# Patient Record
Sex: Female | Born: 1955 | Race: Black or African American | Hispanic: No | Marital: Single | Smoking: Never smoker
Health system: Southern US, Community
[De-identification: ages and names within clinical notes are randomized; demographics above are authoritative.]

## PROBLEM LIST (undated history)

## (undated) DIAGNOSIS — I10 Essential (primary) hypertension: Secondary | ICD-10-CM

## (undated) DIAGNOSIS — N179 Acute kidney failure, unspecified: Secondary | ICD-10-CM

## (undated) DIAGNOSIS — B182 Chronic viral hepatitis C: Secondary | ICD-10-CM

## (undated) DIAGNOSIS — I639 Cerebral infarction, unspecified: Secondary | ICD-10-CM

## (undated) DIAGNOSIS — F1410 Cocaine abuse, uncomplicated: Secondary | ICD-10-CM

---

## 2007-09-29 ENCOUNTER — Emergency Department: Payer: Self-pay | Admitting: Emergency Medicine

## 2007-09-29 ENCOUNTER — Other Ambulatory Visit: Payer: Self-pay

## 2007-11-11 ENCOUNTER — Inpatient Hospital Stay: Payer: Self-pay | Admitting: *Deleted

## 2007-11-11 ENCOUNTER — Other Ambulatory Visit: Payer: Self-pay

## 2008-05-25 ENCOUNTER — Other Ambulatory Visit: Payer: Self-pay

## 2008-05-25 ENCOUNTER — Observation Stay: Payer: Self-pay | Admitting: Internal Medicine

## 2009-04-25 ENCOUNTER — Inpatient Hospital Stay: Payer: Self-pay | Admitting: Internal Medicine

## 2009-09-06 ENCOUNTER — Emergency Department: Payer: Self-pay | Admitting: Unknown Physician Specialty

## 2010-08-26 DIAGNOSIS — I639 Cerebral infarction, unspecified: Secondary | ICD-10-CM

## 2010-08-26 HISTORY — DX: Cerebral infarction, unspecified: I63.9

## 2011-03-21 ENCOUNTER — Emergency Department: Payer: Self-pay | Admitting: Unknown Physician Specialty

## 2011-05-27 ENCOUNTER — Emergency Department: Payer: Self-pay

## 2011-05-27 ENCOUNTER — Inpatient Hospital Stay (HOSPITAL_COMMUNITY)
Admission: AD | Admit: 2011-05-27 | Discharge: 2011-06-21 | DRG: 004 | Disposition: A | Discharge status: Skilled Nursing Facility | Payer: Medicaid Other | Source: Other Acute Inpatient Hospital | Attending: Neurology | Admitting: Neurology

## 2011-05-27 ENCOUNTER — Inpatient Hospital Stay (HOSPITAL_COMMUNITY): Payer: Medicaid Other

## 2011-05-27 DIAGNOSIS — J189 Pneumonia, unspecified organism: Secondary | ICD-10-CM | POA: Diagnosis not present

## 2011-05-27 DIAGNOSIS — B1920 Unspecified viral hepatitis C without hepatic coma: Secondary | ICD-10-CM | POA: Diagnosis present

## 2011-05-27 DIAGNOSIS — I10 Essential (primary) hypertension: Secondary | ICD-10-CM | POA: Diagnosis present

## 2011-05-27 DIAGNOSIS — I619 Nontraumatic intracerebral hemorrhage, unspecified: Secondary | ICD-10-CM | POA: Diagnosis present

## 2011-05-27 DIAGNOSIS — T43601A Poisoning by unspecified psychostimulants, accidental (unintentional), initial encounter: Secondary | ICD-10-CM | POA: Diagnosis present

## 2011-05-27 DIAGNOSIS — J9600 Acute respiratory failure, unspecified whether with hypoxia or hypercapnia: Secondary | ICD-10-CM | POA: Diagnosis not present

## 2011-05-27 DIAGNOSIS — N179 Acute kidney failure, unspecified: Secondary | ICD-10-CM | POA: Diagnosis not present

## 2011-05-27 DIAGNOSIS — G8190 Hemiplegia, unspecified affecting unspecified side: Secondary | ICD-10-CM | POA: Diagnosis present

## 2011-05-27 DIAGNOSIS — T405X1A Poisoning by cocaine, accidental (unintentional), initial encounter: Principal | ICD-10-CM | POA: Diagnosis present

## 2011-05-27 DIAGNOSIS — I629 Nontraumatic intracranial hemorrhage, unspecified: Secondary | ICD-10-CM

## 2011-05-27 DIAGNOSIS — F1410 Cocaine abuse, uncomplicated: Secondary | ICD-10-CM | POA: Diagnosis present

## 2011-05-27 DIAGNOSIS — E876 Hypokalemia: Secondary | ICD-10-CM | POA: Diagnosis not present

## 2011-05-27 DIAGNOSIS — R4020 Unspecified coma: Secondary | ICD-10-CM | POA: Diagnosis present

## 2011-05-27 DIAGNOSIS — Z794 Long term (current) use of insulin: Secondary | ICD-10-CM

## 2011-05-28 ENCOUNTER — Inpatient Hospital Stay (HOSPITAL_COMMUNITY): Payer: Medicaid Other

## 2011-05-28 DIAGNOSIS — F1410 Cocaine abuse, uncomplicated: Secondary | ICD-10-CM

## 2011-05-28 DIAGNOSIS — I629 Nontraumatic intracranial hemorrhage, unspecified: Secondary | ICD-10-CM

## 2011-05-28 DIAGNOSIS — I10 Essential (primary) hypertension: Secondary | ICD-10-CM

## 2011-05-28 DIAGNOSIS — J9600 Acute respiratory failure, unspecified whether with hypoxia or hypercapnia: Secondary | ICD-10-CM

## 2011-05-28 LAB — BLOOD GAS, ARTERIAL
Acid-base deficit: 1 mmol/L (ref 0.0–2.0)
Bicarbonate: 22.9 mEq/L (ref 20.0–24.0)
FIO2: 0.5 %
O2 Saturation: 99.8 %
PEEP: 5 cmH2O
Patient temperature: 98.6
RATE: 18 resp/min

## 2011-05-28 LAB — BASIC METABOLIC PANEL
BUN: 23 mg/dL (ref 6–23)
BUN: 15 mg/dL (ref 6–23)
CO2: 27 mEq/L (ref 19–32)
Calcium: 9.4 mg/dL (ref 8.4–10.5)
Chloride: 103 mEq/L (ref 96–112)
Creatinine, Ser: 1.26 mg/dL — ABNORMAL HIGH (ref 0.50–1.10)
Creatinine, Ser: 1.16 mg/dL — ABNORMAL HIGH (ref 0.50–1.10)
GFR calc Af Amer: 60 mL/min — ABNORMAL LOW (ref >90)
GFR calc non Af Amer: 52 mL/min — ABNORMAL LOW (ref >90)
Glucose, Bld: 185 mg/dL — ABNORMAL HIGH (ref 70–99)

## 2011-05-28 LAB — CBC
MCH: 31.2 pg (ref 26.0–34.0)
MCV: 90.5 fL (ref 78.0–100.0)
Platelets: 222 10*3/uL (ref 150–400)
RDW: 13.1 % (ref 11.5–15.5)

## 2011-05-28 LAB — URINE MICROSCOPIC-ADD ON

## 2011-05-28 LAB — URINALYSIS, ROUTINE W REFLEX MICROSCOPIC
Bilirubin Urine: NEGATIVE
Nitrite: NEGATIVE
Specific Gravity, Urine: 1.017 (ref 1.005–1.030)
Urobilinogen, UA: 0.2 mg/dL (ref 0.0–1.0)
pH: 5 (ref 5.0–8.0)

## 2011-05-28 LAB — GLUCOSE, CAPILLARY: Glucose-Capillary: 191 mg/dL — ABNORMAL HIGH (ref 70–99)

## 2011-05-28 LAB — MAGNESIUM: Magnesium: 1.8 mg/dL (ref 1.5–2.5)

## 2011-05-29 ENCOUNTER — Inpatient Hospital Stay (HOSPITAL_COMMUNITY): Payer: Medicaid Other

## 2011-05-29 DIAGNOSIS — I6789 Other cerebrovascular disease: Secondary | ICD-10-CM

## 2011-05-29 LAB — CBC
MCH: 31.8 pg (ref 26.0–34.0)
MCV: 92.9 fL (ref 78.0–100.0)
Platelets: 207 10*3/uL (ref 150–400)
RDW: 13.4 % (ref 11.5–15.5)

## 2011-05-29 LAB — BASIC METABOLIC PANEL
Calcium: 9.5 mg/dL (ref 8.4–10.5)
Creatinine, Ser: 1.28 mg/dL — ABNORMAL HIGH (ref 0.50–1.10)
GFR calc Af Amer: 53 mL/min — ABNORMAL LOW (ref >90)
GFR calc non Af Amer: 46 mL/min — ABNORMAL LOW (ref >90)
Sodium: 137 mEq/L (ref 135–145)

## 2011-05-29 LAB — BLOOD GAS, ARTERIAL
Bicarbonate: 22 mEq/L (ref 20.0–24.0)
FIO2: 0.3 %
O2 Saturation: 98.6 %
PEEP: 5 cmH2O
TCO2: 23 mmol/L (ref 0–100)
pO2, Arterial: 103 mmHg — ABNORMAL HIGH (ref 80.0–100.0)

## 2011-05-29 LAB — GLUCOSE, CAPILLARY
Glucose-Capillary: 234 mg/dL — ABNORMAL HIGH (ref 70–99)
Glucose-Capillary: 165 mg/dL — ABNORMAL HIGH (ref 70–99)
Glucose-Capillary: 165 mg/dL — ABNORMAL HIGH (ref 70–99)

## 2011-05-29 LAB — LIPID PANEL
Cholesterol: 181 mg/dL (ref 0–200)
VLDL: 58 mg/dL — ABNORMAL HIGH (ref 0–40)

## 2011-05-30 ENCOUNTER — Inpatient Hospital Stay (HOSPITAL_COMMUNITY): Payer: Medicaid Other

## 2011-05-30 ENCOUNTER — Encounter (HOSPITAL_COMMUNITY): Payer: 59

## 2011-05-30 DIAGNOSIS — I10 Essential (primary) hypertension: Secondary | ICD-10-CM

## 2011-05-30 DIAGNOSIS — I629 Nontraumatic intracranial hemorrhage, unspecified: Secondary | ICD-10-CM

## 2011-05-30 DIAGNOSIS — J9600 Acute respiratory failure, unspecified whether with hypoxia or hypercapnia: Secondary | ICD-10-CM

## 2011-05-30 DIAGNOSIS — F1410 Cocaine abuse, uncomplicated: Secondary | ICD-10-CM

## 2011-05-30 LAB — CBC
MCH: 31.8 pg (ref 26.0–34.0)
MCHC: 34.2 g/dL (ref 30.0–36.0)
Platelets: 193 10*3/uL (ref 150–400)
RBC: 3.33 MIL/uL — ABNORMAL LOW (ref 3.87–5.11)

## 2011-05-30 LAB — BASIC METABOLIC PANEL
CO2: 22 mEq/L (ref 19–32)
Calcium: 9.5 mg/dL (ref 8.4–10.5)
GFR calc non Af Amer: 39 mL/min — ABNORMAL LOW (ref >90)
Sodium: 137 mEq/L (ref 135–145)

## 2011-05-30 LAB — GLUCOSE, CAPILLARY
Glucose-Capillary: 185 mg/dL — ABNORMAL HIGH (ref 70–99)
Glucose-Capillary: 186 mg/dL — ABNORMAL HIGH (ref 70–99)
Glucose-Capillary: 129 mg/dL — ABNORMAL HIGH (ref 70–99)

## 2011-05-31 ENCOUNTER — Inpatient Hospital Stay (HOSPITAL_COMMUNITY): Payer: Medicaid Other

## 2011-05-31 LAB — BASIC METABOLIC PANEL
BUN: 25 mg/dL — ABNORMAL HIGH (ref 6–23)
Calcium: 9.1 mg/dL (ref 8.4–10.5)
GFR calc Af Amer: 56 mL/min — ABNORMAL LOW (ref >90)
GFR calc non Af Amer: 49 mL/min — ABNORMAL LOW (ref >90)
Glucose, Bld: 141 mg/dL — ABNORMAL HIGH (ref 70–99)
Potassium: 3.4 mEq/L — ABNORMAL LOW (ref 3.5–5.1)
Sodium: 140 mEq/L (ref 135–145)

## 2011-05-31 LAB — PROTIME-INR
INR: 1.12 (ref 0.00–1.49)
Prothrombin Time: 14.6 seconds (ref 11.6–15.2)

## 2011-06-01 ENCOUNTER — Inpatient Hospital Stay (HOSPITAL_COMMUNITY): Payer: Medicaid Other

## 2011-06-01 LAB — BASIC METABOLIC PANEL
BUN: 24 mg/dL — ABNORMAL HIGH (ref 6–23)
Calcium: 9.3 mg/dL (ref 8.4–10.5)
Creatinine, Ser: 1.27 mg/dL — ABNORMAL HIGH (ref 0.50–1.10)
GFR calc Af Amer: 54 mL/min — ABNORMAL LOW (ref >90)
GFR calc non Af Amer: 46 mL/min — ABNORMAL LOW (ref >90)
Glucose, Bld: 182 mg/dL — ABNORMAL HIGH (ref 70–99)

## 2011-06-01 LAB — URINE MICROSCOPIC-ADD ON

## 2011-06-01 LAB — GLUCOSE, CAPILLARY
Glucose-Capillary: 165 mg/dL — ABNORMAL HIGH (ref 70–99)
Glucose-Capillary: 135 mg/dL — ABNORMAL HIGH (ref 70–99)
Glucose-Capillary: 192 mg/dL — ABNORMAL HIGH (ref 70–99)

## 2011-06-01 LAB — URINALYSIS, ROUTINE W REFLEX MICROSCOPIC
Bilirubin Urine: NEGATIVE
Glucose, UA: NEGATIVE mg/dL
Ketones, ur: 15 mg/dL — AB
Leukocytes, UA: NEGATIVE
pH: 5.5 (ref 5.0–8.0)

## 2011-06-01 LAB — CBC
HCT: 28.7 % — ABNORMAL LOW (ref 36.0–46.0)
Hemoglobin: 9.7 g/dL — ABNORMAL LOW (ref 12.0–15.0)
MCH: 31.7 pg (ref 26.0–34.0)
MCHC: 33.8 g/dL (ref 30.0–36.0)
RDW: 13.5 % (ref 11.5–15.5)

## 2011-06-02 ENCOUNTER — Inpatient Hospital Stay (HOSPITAL_COMMUNITY): Payer: Medicaid Other

## 2011-06-02 DIAGNOSIS — I10 Essential (primary) hypertension: Secondary | ICD-10-CM

## 2011-06-02 DIAGNOSIS — I629 Nontraumatic intracranial hemorrhage, unspecified: Secondary | ICD-10-CM

## 2011-06-02 DIAGNOSIS — J9600 Acute respiratory failure, unspecified whether with hypoxia or hypercapnia: Secondary | ICD-10-CM

## 2011-06-02 DIAGNOSIS — F1410 Cocaine abuse, uncomplicated: Secondary | ICD-10-CM

## 2011-06-02 DIAGNOSIS — Z930 Tracheostomy status: Secondary | ICD-10-CM

## 2011-06-02 DIAGNOSIS — Z9911 Dependence on respirator [ventilator] status: Secondary | ICD-10-CM

## 2011-06-02 LAB — CBC
Hemoglobin: 8.7 g/dL — ABNORMAL LOW (ref 12.0–15.0)
MCHC: 34 g/dL (ref 30.0–36.0)
Platelets: 212 10*3/uL (ref 150–400)
RBC: 2.74 MIL/uL — ABNORMAL LOW (ref 3.87–5.11)

## 2011-06-02 LAB — URINE CULTURE: Colony Count: 9000

## 2011-06-02 LAB — BASIC METABOLIC PANEL
GFR calc non Af Amer: 63 mL/min — ABNORMAL LOW (ref >90)
Glucose, Bld: 233 mg/dL — ABNORMAL HIGH (ref 70–99)
Potassium: 3.9 mEq/L (ref 3.5–5.1)
Sodium: 145 mEq/L (ref 135–145)

## 2011-06-02 LAB — GLUCOSE, CAPILLARY
Glucose-Capillary: 233 mg/dL — ABNORMAL HIGH (ref 70–99)
Glucose-Capillary: 251 mg/dL — ABNORMAL HIGH (ref 70–99)
Glucose-Capillary: 237 mg/dL — ABNORMAL HIGH (ref 70–99)

## 2011-06-02 NOTE — H&P (Signed)
NAMEDERYL, PORTS              ACCOUNT NO.:  [id number]  MEDICAL RECORD NO.:  [id number]  LOCATION:  3110                         FACILITY:  [facility]  PHYSICIAN:  Linda Thaxton P. Pearlean Brownie, MD    DATE OF BIRTH:  11-15-1955  DATE OF ADMISSION:  05/27/2011 DATE OF DISCHARGE:                             HISTORY & PHYSICAL   REFERRING PHYSICIAN:  Dr. Hope Pigeon from [facility].  REASON FOR REFERRAL:  Intracerebral hemorrhage.  HISTORY OF PRESENT ILLNESS:  Linda [name] is a 55 year old African American lady who was found unresponsive at 3 p.m. by family.  She was initially thought to have possible seizures and had some facial droop noted at [facility] and she was found to be initially slow to respond and subsequently, she was found to have some gurgling and inability to protect her airway and was intubated.  CT scan of the head showed a 5.2 x 1.3 cm left putaminal basal ganglia hemorrhage.  Blood pressure on admission was elevated at 190-110.  She was given labetalol and blood pressure came down but en route during the transfer blood pressure was found to be elevated again and she was agitated.  She was sedated with propofol during the transfer, but remained slightly agitated yet.  Her urine drug screen was found to be positive for cocaine.  The patient is unable to provide any history.  History obtained is as from the transferring note from [facility] and family is not available at present at the bedside.  PAST MEDICAL HISTORY:  Not available.  HOME MEDICATIONS:  None known at the present time.  MEDICATION ALLERGIES:  Also unknown at present.  FAMILY HISTORY:  Noncontributory.  SOCIAL HISTORY:  Positive for drug abuse with cocaine.  REVIEW OF SYSTEMS:  Not obtainable.  PHYSICAL EXAMINATION:  GENERAL:  A middle-aged African American lady who is sedated and intubated on propofol drip. VITAL SIGNS:  Temperature 98.3, blood pressure 208/110,  heart rate 98 per minute, regular sinus, respiratory rate 18 per minute, oxygen sat 98% on room air. ENT:  Unremarkable. CARDIAC:  No murmur or gallop. LUNGS:  Clear to auscultation. ABDOMEN:  Soft, nontender. NEUROLOGIC:  The patient is sedated on propofol.  Her eyes are closed. Pupils are 2 mm, sluggishly reactive.  Doll eye movements are sluggish. Cough and gag are preserved.  She grimaces to pain symmetrically.  She moves spontaneously left side more than right.  She does withdraw the right side to pain.  Plantar right is equivocal, left is downgoing.  DATA REVIEWED:  Basic metabolic panel labs, CBC and coagulation labs are normal.  Urine drug screen is positive for cocaine.  CT scan of the head noncontrast study done at [facility] shows a 5.9 x 1.9 x 3-cm left putaminal intracerebral hemorrhage without intraventricular extension or hydrocephalus, but there is mild mass effect noted.  The volume of the hemorrhage is 19 cubic cc.  IMPRESSION:  A 55 year old lady with cocaine and hypertension related left basal ganglia intracerebral hemorrhage.  PLAN:  The patient has presented beyond 6 hours of onset of hemorrhage. She remains critically ill, has significant risk for neurological worsening, death, and repeat hemorrhage.  Care requires frequent neurological checks and __________ monitoring, medical decision making of high complexity.  I spent 1 hour of critical care time of the patient's care.  She will be admitted to the neurological intensive care unit.  We will manager her blood pressure aggressively and aim for systolic blood pressure range between 110-160.  Use IV labetalol p.r.n. and start her on IV nicardipine drip to achieve the above goals.  Keep her sedated, intubated and repeat noncontrast CAT scan of the head in the morning and MRI later.  Pulmonary Critical Care consult for ventilator management.  I will discuss her care and prognosis with  her family members when they arrive.     Linda Brannen P. Pearlean Brownie, MD     PPS/MEDQ  D:  05/27/2011  T:  05/28/2011  Job:  [id number]  Electronically Signed by Delia Heady MD on 06/02/2011 10:28:53 PM

## 2011-06-03 ENCOUNTER — Inpatient Hospital Stay (HOSPITAL_COMMUNITY): Payer: Medicaid Other

## 2011-06-03 LAB — CBC
HCT: 27.1 % — ABNORMAL LOW (ref 36.0–46.0)
Hemoglobin: 8.8 g/dL — ABNORMAL LOW (ref 12.0–15.0)
RBC: 2.85 MIL/uL — ABNORMAL LOW (ref 3.87–5.11)

## 2011-06-03 LAB — BASIC METABOLIC PANEL
Calcium: 10.2 mg/dL (ref 8.4–10.5)
GFR calc Af Amer: 68 mL/min — ABNORMAL LOW (ref >90)
GFR calc non Af Amer: 59 mL/min — ABNORMAL LOW (ref >90)
Potassium: 5 mEq/L (ref 3.5–5.1)
Sodium: 146 mEq/L — ABNORMAL HIGH (ref 135–145)

## 2011-06-03 LAB — GLUCOSE, CAPILLARY
Glucose-Capillary: 250 mg/dL — ABNORMAL HIGH (ref 70–99)
Glucose-Capillary: 292 mg/dL — ABNORMAL HIGH (ref 70–99)
Glucose-Capillary: 241 mg/dL — ABNORMAL HIGH (ref 70–99)
Glucose-Capillary: 308 mg/dL — ABNORMAL HIGH (ref 70–99)

## 2011-06-04 ENCOUNTER — Inpatient Hospital Stay (HOSPITAL_COMMUNITY): Payer: Medicaid Other

## 2011-06-04 DIAGNOSIS — N179 Acute kidney failure, unspecified: Secondary | ICD-10-CM

## 2011-06-04 DIAGNOSIS — I619 Nontraumatic intracerebral hemorrhage, unspecified: Secondary | ICD-10-CM

## 2011-06-04 DIAGNOSIS — J9600 Acute respiratory failure, unspecified whether with hypoxia or hypercapnia: Secondary | ICD-10-CM

## 2011-06-04 DIAGNOSIS — I10 Essential (primary) hypertension: Secondary | ICD-10-CM

## 2011-06-04 LAB — CULTURE, BLOOD (ROUTINE X 2)
Culture  Setup Time: 201210030718
Culture: NO GROWTH

## 2011-06-04 LAB — GLUCOSE, CAPILLARY
Glucose-Capillary: 180 mg/dL — ABNORMAL HIGH (ref 70–99)
Glucose-Capillary: 242 mg/dL — ABNORMAL HIGH (ref 70–99)
Glucose-Capillary: 284 mg/dL — ABNORMAL HIGH (ref 70–99)
Glucose-Capillary: 331 mg/dL — ABNORMAL HIGH (ref 70–99)

## 2011-06-05 ENCOUNTER — Inpatient Hospital Stay (HOSPITAL_COMMUNITY): Payer: Medicaid Other

## 2011-06-05 LAB — CBC
HCT: 28 % — ABNORMAL LOW (ref 36.0–46.0)
Hemoglobin: 9.3 g/dL — ABNORMAL LOW (ref 12.0–15.0)
MCH: 31.8 pg (ref 26.0–34.0)
MCHC: 33.2 g/dL (ref 30.0–36.0)
MCV: 95.9 fL (ref 78.0–100.0)
Platelets: 305 10*3/uL (ref 150–400)
RBC: 2.92 MIL/uL — ABNORMAL LOW (ref 3.87–5.11)
RDW: 12.9 % (ref 11.5–15.5)
WBC: 6.6 10*3/uL (ref 4.0–10.5)

## 2011-06-05 LAB — GLUCOSE, CAPILLARY
Glucose-Capillary: 162 mg/dL — ABNORMAL HIGH (ref 70–99)
Glucose-Capillary: 154 mg/dL — ABNORMAL HIGH (ref 70–99)
Glucose-Capillary: 117 mg/dL — ABNORMAL HIGH (ref 70–99)
Glucose-Capillary: 175 mg/dL — ABNORMAL HIGH (ref 70–99)

## 2011-06-05 LAB — BASIC METABOLIC PANEL
BUN: 37 mg/dL — ABNORMAL HIGH (ref 6–23)
CO2: 30 mEq/L (ref 19–32)
Calcium: 10.5 mg/dL (ref 8.4–10.5)
Chloride: 110 mEq/L (ref 96–112)
Creatinine, Ser: 0.92 mg/dL (ref 0.50–1.10)
GFR calc Af Amer: 79 mL/min — ABNORMAL LOW (ref >90)
GFR calc non Af Amer: 68 mL/min — ABNORMAL LOW (ref >90)
Glucose, Bld: 167 mg/dL — ABNORMAL HIGH (ref 70–99)
Potassium: 4 mEq/L (ref 3.5–5.1)
Sodium: 150 mEq/L — ABNORMAL HIGH (ref 135–145)

## 2011-06-06 DIAGNOSIS — I629 Nontraumatic intracranial hemorrhage, unspecified: Secondary | ICD-10-CM

## 2011-06-06 DIAGNOSIS — J209 Acute bronchitis, unspecified: Secondary | ICD-10-CM

## 2011-06-06 DIAGNOSIS — J9600 Acute respiratory failure, unspecified whether with hypoxia or hypercapnia: Secondary | ICD-10-CM

## 2011-06-06 DIAGNOSIS — Z930 Tracheostomy status: Secondary | ICD-10-CM

## 2011-06-06 LAB — GLUCOSE, CAPILLARY
Glucose-Capillary: 218 mg/dL — ABNORMAL HIGH (ref 70–99)
Glucose-Capillary: 177 mg/dL — ABNORMAL HIGH (ref 70–99)
Glucose-Capillary: 169 mg/dL — ABNORMAL HIGH (ref 70–99)

## 2011-06-07 ENCOUNTER — Inpatient Hospital Stay (HOSPITAL_COMMUNITY): Payer: Medicaid Other

## 2011-06-07 LAB — CULTURE, BLOOD (ROUTINE X 2)
Culture  Setup Time: 201210061821
Culture: NO GROWTH

## 2011-06-07 LAB — GLUCOSE, CAPILLARY
Glucose-Capillary: 184 mg/dL — ABNORMAL HIGH (ref 70–99)
Glucose-Capillary: 196 mg/dL — ABNORMAL HIGH (ref 70–99)
Glucose-Capillary: 244 mg/dL — ABNORMAL HIGH (ref 70–99)

## 2011-06-08 LAB — GLUCOSE, CAPILLARY
Glucose-Capillary: 158 mg/dL — ABNORMAL HIGH (ref 70–99)
Glucose-Capillary: 205 mg/dL — ABNORMAL HIGH (ref 70–99)

## 2011-06-08 LAB — CULTURE, RESPIRATORY W GRAM STAIN

## 2011-06-09 ENCOUNTER — Inpatient Hospital Stay (HOSPITAL_COMMUNITY): Payer: Medicaid Other

## 2011-06-09 DIAGNOSIS — J209 Acute bronchitis, unspecified: Secondary | ICD-10-CM

## 2011-06-09 DIAGNOSIS — I629 Nontraumatic intracranial hemorrhage, unspecified: Secondary | ICD-10-CM

## 2011-06-09 DIAGNOSIS — J9600 Acute respiratory failure, unspecified whether with hypoxia or hypercapnia: Secondary | ICD-10-CM

## 2011-06-09 DIAGNOSIS — Z930 Tracheostomy status: Secondary | ICD-10-CM

## 2011-06-09 LAB — BLOOD GAS, ARTERIAL
Acid-Base Excess: 6.3 mmol/L — ABNORMAL HIGH (ref 0.0–2.0)
Drawn by: 33176
TCO2: 31.4 mmol/L (ref 0–100)
pCO2 arterial: 44.4 mmHg (ref 35.0–45.0)
pO2, Arterial: 78.7 mmHg — ABNORMAL LOW (ref 80.0–100.0)

## 2011-06-09 LAB — BASIC METABOLIC PANEL
BUN: 41 mg/dL — ABNORMAL HIGH (ref 6–23)
Calcium: 9.8 mg/dL (ref 8.4–10.5)
GFR calc non Af Amer: 62 mL/min — ABNORMAL LOW (ref >90)
Glucose, Bld: 236 mg/dL — ABNORMAL HIGH (ref 70–99)
Potassium: 4.1 mEq/L (ref 3.5–5.1)

## 2011-06-09 LAB — CBC
HCT: 26.4 % — ABNORMAL LOW (ref 36.0–46.0)
Hemoglobin: 8.5 g/dL — ABNORMAL LOW (ref 12.0–15.0)
MCH: 31.4 pg (ref 26.0–34.0)
MCHC: 32.2 g/dL (ref 30.0–36.0)

## 2011-06-09 LAB — GLUCOSE, CAPILLARY
Glucose-Capillary: 197 mg/dL — ABNORMAL HIGH (ref 70–99)
Glucose-Capillary: 214 mg/dL — ABNORMAL HIGH (ref 70–99)

## 2011-06-10 ENCOUNTER — Inpatient Hospital Stay (HOSPITAL_COMMUNITY): Payer: Medicaid Other

## 2011-06-10 LAB — GLUCOSE, CAPILLARY
Glucose-Capillary: 209 mg/dL — ABNORMAL HIGH (ref 70–99)
Glucose-Capillary: 266 mg/dL — ABNORMAL HIGH (ref 70–99)
Glucose-Capillary: 135 mg/dL — ABNORMAL HIGH (ref 70–99)

## 2011-06-10 LAB — BASIC METABOLIC PANEL
Calcium: 10.1 mg/dL (ref 8.4–10.5)
GFR calc non Af Amer: 80 mL/min — ABNORMAL LOW (ref >90)
Glucose, Bld: 275 mg/dL — ABNORMAL HIGH (ref 70–99)
Sodium: 147 mEq/L — ABNORMAL HIGH (ref 135–145)

## 2011-06-11 DIAGNOSIS — J9600 Acute respiratory failure, unspecified whether with hypoxia or hypercapnia: Secondary | ICD-10-CM

## 2011-06-11 DIAGNOSIS — I629 Nontraumatic intracranial hemorrhage, unspecified: Secondary | ICD-10-CM

## 2011-06-11 DIAGNOSIS — Z930 Tracheostomy status: Secondary | ICD-10-CM

## 2011-06-11 DIAGNOSIS — J209 Acute bronchitis, unspecified: Secondary | ICD-10-CM

## 2011-06-11 LAB — BASIC METABOLIC PANEL
CO2: 29 mEq/L (ref 19–32)
Calcium: 10.5 mg/dL (ref 8.4–10.5)
GFR calc Af Amer: >90 mL/min (ref >90)
GFR calc non Af Amer: >90 mL/min (ref >90)
Sodium: 147 mEq/L — ABNORMAL HIGH (ref 135–145)

## 2011-06-11 LAB — GLUCOSE, CAPILLARY
Glucose-Capillary: 131 mg/dL — ABNORMAL HIGH (ref 70–99)
Glucose-Capillary: 175 mg/dL — ABNORMAL HIGH (ref 70–99)
Glucose-Capillary: 181 mg/dL — ABNORMAL HIGH (ref 70–99)
Glucose-Capillary: 196 mg/dL — ABNORMAL HIGH (ref 70–99)
Glucose-Capillary: 214 mg/dL — ABNORMAL HIGH (ref 70–99)

## 2011-06-12 LAB — GLUCOSE, CAPILLARY
Glucose-Capillary: 111 mg/dL — ABNORMAL HIGH (ref 70–99)
Glucose-Capillary: 118 mg/dL — ABNORMAL HIGH (ref 70–99)
Glucose-Capillary: 152 mg/dL — ABNORMAL HIGH (ref 70–99)
Glucose-Capillary: 187 mg/dL — ABNORMAL HIGH (ref 70–99)

## 2011-06-13 ENCOUNTER — Inpatient Hospital Stay (HOSPITAL_COMMUNITY): Payer: Medicaid Other

## 2011-06-13 LAB — BASIC METABOLIC PANEL
Calcium: 10.1 mg/dL (ref 8.4–10.5)
Creatinine, Ser: 0.78 mg/dL (ref 0.50–1.10)
GFR calc Af Amer: >90 mL/min (ref >90)
GFR calc non Af Amer: >90 mL/min (ref >90)

## 2011-06-13 LAB — GLUCOSE, CAPILLARY
Glucose-Capillary: 168 mg/dL — ABNORMAL HIGH (ref 70–99)
Glucose-Capillary: 173 mg/dL — ABNORMAL HIGH (ref 70–99)
Glucose-Capillary: 241 mg/dL — ABNORMAL HIGH (ref 70–99)

## 2011-06-13 NOTE — Op Note (Signed)
  NAMEPATTE, Linda [name]              ACCOUNT NO.:  [id number]  MEDICAL RECORD NO.:  [id number]  LOCATION:                                 FACILITY:  PHYSICIAN:  Marlisha Vanwyk L. Malon Kindle., M.D.DATE OF BIRTH:  29-Apr-1956  DATE OF PROCEDURE:  06/05/2011 DATE OF DISCHARGE:                              OPERATIVE REPORT   PROCEDURE:  Esophagogastroduodenoscopy with percutaneous endoscopic gastrostomy tube placement.  MEDICATIONS: 1. Ancef 2 g IV. 2. Fentanyl 100 mcg. 3. Versed 10 mg IV.  INDICATION:  [name] who has had a massive stroke and has been on the ventilator, is minimally responsive and has required tube feeding.  Her prognosis for recovery to the point of being able to take inadequate nutrition and hydration is felt to not be very good and a PEG tube was requested by the Neurological Service.  DESCRIPTION OF PROCEDURE:  The procedure had been discussed with the patient's sister who is her power-of-attorney and consent obtained.  The patient has a tracheostomy tube and had been weaned from the ventilator, but at this point, was placed back on the ventilator for this procedure. A Pentax upper scope was inserted with the cuff on the tracheostomy tube deflated.  We followed the nasogastric tube into the stomach and the nasogastric tube was pulled and the cuff reinflated.  Complete endoscopy was performed.  The duodenum including the bulb and second portion was normal.  There was streaky gastritis in the stomach of a mild nature. We then transilluminated the abdomen and there was one spot where the transillumination was seen with pressure in the epigastrium through her adequate adipose tissue.  This area was marked and was cleansed with Betadine.  I then infiltrated the area with Xylocaine with a subcutaneous needle and made a small incision with a scalpel.  The [employer] 24-French push-type PEG tube was used.  The needle trocar was placed into the  lumen of the stomach with one stick.  The snare was placed around the needle trocar and the trocar removed. Guidewire was threaded through the trocar and removed out through the mouth in usual manner.  The 24-French [employer] push-type tube was placed over the wire and pulled out the abdominal wall.  The scope was then reinserted and the wire followed down into the stomach.  The internal bumper was seen to be snug against the gastric wall on photograph.  The guidewire was then removed.  The patient tolerated the procedure well and was heavily sedated on the ventilator at the termination of the procedure.  ASSESSMENT:  Successful percutaneous endoscopic gastrostomy.  PLAN:  See post PEG orders.          ______________________________ Llana Aliment Malon Kindle., M.D.     Waldron Session  D:  06/05/2011  T:  06/05/2011  Job:  [id number]  Electronically Signed by Carman Ching M.D. on 06/13/2011 12:32:38 PM

## 2011-06-13 NOTE — Consult Note (Signed)
  [name], [name]              ACCOUNT NO.:  [id number]  MEDICAL RECORD NO.:  [id number]  LOCATION:  3111                         FACILITY:  [facility]  PHYSICIAN:  Linda [name]., M.D.DATE OF BIRTH:  Oct 25, 1955  DATE OF CONSULTATION:  06/04/2011 DATE OF DISCHARGE:                                CONSULTATION   REQUESTED BY:  Critical Care medicine.  The patient is unassigned.  REASON FOR CONSULT:  Probable PEG tube placement.  HISTORY:  The patient is an unfortunate 55 year old who presented with an intracranial hemorrhage from [facility].  She has failed to improve.  She has been intubated and now has a trach.  She has been on Protonix.  She has been followed by Critical Care Medicine. The stroke team and Critical Care Medical Service feel that her prognosis for improvement is poor and she has remained completely nonresponsive and requested a permanent PEG tube for enteral nutrition. She has had a nasogastric tube that has been placed radiographically and has been doing well with this.  CURRENT MEDICATIONS:  Norvasc, Klonopin, insulin, Protonix, Tylenol, fentanyl, Ativan, and Cardene.  ALLERGIES:  The patient has no known drug allergies.  I did confirm this with her sister with whom I spoke today.  MEDICAL HISTORY:  She has a history of cocaine abuse, hypertension, and hepatitis C.  Details of all these things are unknown.  PHYSICAL EXAMINATION:  VITAL SIGNS:  Unremarkable. GENERAL:  An extended African American female lying in the bed.  There is a trach collar in place.  LUNGS:  Coarse breath sounds anteriorly. HEART:  Regular rate and rhythm without murmurs or gallops. ABDOMEN:  Nondistended.  It is soft with no surgical scars in the mid abdomen.  ASSESSMENT: Feeding problems due to stroke and inability to eat.  We will require permanent PEG tube placement.  PLAN:  We will proceed with PEG tube tomorrow.  We will try to get respiratory  to provided ventilation during the procedure.  I have discussed the procedure in detail with Linda [name] phone [phone], the patient's sister and power of attorney.  The risks and benefits of the procedure were discussed.          ______________________________ Llana Aliment Malon [name]., M.D.     Waldron Session  D:  06/04/2011  T:  06/05/2011  Job:  [id number]  Electronically Signed by Carman Ching M.D. on 06/13/2011 12:32:29 PM

## 2011-06-14 ENCOUNTER — Inpatient Hospital Stay (HOSPITAL_COMMUNITY): Payer: Medicaid Other

## 2011-06-14 DIAGNOSIS — Z930 Tracheostomy status: Secondary | ICD-10-CM

## 2011-06-14 DIAGNOSIS — I629 Nontraumatic intracranial hemorrhage, unspecified: Secondary | ICD-10-CM

## 2011-06-14 DIAGNOSIS — J9600 Acute respiratory failure, unspecified whether with hypoxia or hypercapnia: Secondary | ICD-10-CM

## 2011-06-14 DIAGNOSIS — J209 Acute bronchitis, unspecified: Secondary | ICD-10-CM

## 2011-06-14 LAB — GLUCOSE, CAPILLARY
Glucose-Capillary: 119 mg/dL — ABNORMAL HIGH (ref 70–99)
Glucose-Capillary: 156 mg/dL — ABNORMAL HIGH (ref 70–99)
Glucose-Capillary: 160 mg/dL — ABNORMAL HIGH (ref 70–99)
Glucose-Capillary: 188 mg/dL — ABNORMAL HIGH (ref 70–99)
Glucose-Capillary: 300 mg/dL — ABNORMAL HIGH (ref 70–99)

## 2011-06-15 LAB — GLUCOSE, CAPILLARY: Glucose-Capillary: 142 mg/dL — ABNORMAL HIGH (ref 70–99)

## 2011-06-16 LAB — GLUCOSE, CAPILLARY
Glucose-Capillary: 104 mg/dL — ABNORMAL HIGH (ref 70–99)
Glucose-Capillary: 111 mg/dL — ABNORMAL HIGH (ref 70–99)

## 2011-06-17 LAB — GLUCOSE, CAPILLARY
Glucose-Capillary: 106 mg/dL — ABNORMAL HIGH (ref 70–99)
Glucose-Capillary: 119 mg/dL — ABNORMAL HIGH (ref 70–99)
Glucose-Capillary: 127 mg/dL — ABNORMAL HIGH (ref 70–99)
Glucose-Capillary: 174 mg/dL — ABNORMAL HIGH (ref 70–99)

## 2011-06-17 LAB — CBC
HCT: 30 % — ABNORMAL LOW (ref 36.0–46.0)
Hemoglobin: 10.2 g/dL — ABNORMAL LOW (ref 12.0–15.0)
MCV: 92 fL (ref 78.0–100.0)
RBC: 3.26 MIL/uL — ABNORMAL LOW (ref 3.87–5.11)
WBC: 6.7 10*3/uL (ref 4.0–10.5)

## 2011-06-17 LAB — BASIC METABOLIC PANEL
BUN: 28 mg/dL — ABNORMAL HIGH (ref 6–23)
CO2: 30 mEq/L (ref 19–32)
Chloride: 97 mEq/L (ref 96–112)
Creatinine, Ser: 0.81 mg/dL (ref 0.50–1.10)
Glucose, Bld: 105 mg/dL — ABNORMAL HIGH (ref 70–99)
Potassium: 4.2 mEq/L (ref 3.5–5.1)

## 2011-06-18 LAB — GLUCOSE, CAPILLARY
Glucose-Capillary: 122 mg/dL — ABNORMAL HIGH (ref 70–99)
Glucose-Capillary: 162 mg/dL — ABNORMAL HIGH (ref 70–99)

## 2011-06-18 NOTE — H&P (Signed)
  Linda [name], Linda [name]              ACCOUNT NO.:  [id number]  MEDICAL RECORD NO.:  [id number]  LOCATION:  3110                         FACILITY:  [facility]  PHYSICIAN:  Newman Pies, MD            DATE OF BIRTH:  October 13, 1955  DATE OF ADMISSION:  05/27/2011 DATE OF DISCHARGE:                             HISTORY & PHYSICAL   CHIEF COMPLAINT:  Respiratory distress, intracerebral hemorrhage, possible thyroid goiter.  HISTORY OF PRESENT ILLNESS:  The patient is a 55 year old African American lady, who was initially found to be unresponsive on May 27, 2011.  She was initially evaluated at the [facility]. She was found to have difficulty protecting her airway, and was intubated.  CT scan of her head showed a 5-cm intracerebral hemorrhage. Her blood pressure was also elevated, with systolic blood pressure at 190.  She was emergently transferred to the [facility] for further evaluation and treatment.  Her subsequent evaluation showed evidence of cocaine abuse.  She was admitted to the ICU for further treatment of intracerebral hemorrhage and airway difficulty.  The patient was slowly weaned from her ventilator support.  However, after she was extubated yesterday, she was noted to have significant respiratory distress, requiring reintubation.  She was also noted to have a possible thyroid goiter, thus precluding bedside percutaneous tracheostomy tube placement.  ENT was, therefore, consulted for further evaluation and possible open tracheostomy tube placement.  PAST MEDICAL HISTORY: 1. Hepatitis C. 2. Hypertension. 3. Cocaine abuse.  PAST SURGICAL HISTORY:  None known.  HOME MEDICATIONS:  None listed.  ALLERGIES:  No known drug allergies at this time.  FAMILY HISTORY:  Noncontributory.  PHYSICAL EXAMINATION:  VITALS:  Blood pressure 134/74, pulse 100, respirations 18, oxygen saturation 96% on ventilator support. GENERAL:  The patient is a well-nourished and  well-developed 55 year old African American female.  The patient is currently sedated and is on ventilator support. HEENT:  Her pupils are constricted bilaterally.  Examination of the ears shows normal auricles and external auditory canals.  Nasal examination shows normal mucosa, septum, and turbinates.  Oral cavity examination shows the endotracheal tube to be in place.  The lips, gums, tongue, and oral cavity mucosa are normal. NECK:  Palpation of the neck shows a prominent thyroid gland.  The trachea is midline.  IMPRESSION: 1. Intracerebral hemorrhage, resulting in altered mental status and     respiratory distress. 2. Possible thyroid goiter.  RECOMMENDATIONS:  In light of the above history and physical exam findings, the decision was made to perform tracheostomy tube placement in the operating room.  Tracheostomy tube placement consent was previously obtained by the Critical Care physicians from the family members.     Newman Pies, MD     ST/MEDQ  D:  05/31/2011  T:  05/31/2011  Job:  [id number]  Electronically Signed by Newman Pies MD on 06/18/2011 10:23:21 AM

## 2011-06-18 NOTE — Op Note (Signed)
  Linda [name], Linda [name]              ACCOUNT NO.:  [id number]  MEDICAL RECORD NO.:  [id number]  LOCATION:  3110                         FACILITY:  [facility]  PHYSICIAN:  Newman Pies, MD            DATE OF BIRTH:  04-06-1956  DATE OF PROCEDURE:  05/31/2011 DATE OF DISCHARGE:                              OPERATIVE REPORT   SURGEON:  Newman Pies, MD  PREOPERATIVE DIAGNOSES: 1. Intracerebral hemorrhagic stroke. 2. Respiratory distress. 3. Ventilator dependence.  POSTOPERATIVE DIAGNOSES: 1. Intracerebral hemorrhagic stroke. 2. Respiratory distress. 3. Ventilator dependence.  PROCEDURE PERFORMED:  Tracheostomy tube placement.  ANESTHESIA:  General endotracheal tube anesthesia.  COMPLICATIONS:  None.  ESTIMATED BLOOD LOSS:  Minimal.  INDICATIONS FOR THE PROCEDURE:  The patient is a 55 year old female who was recently admitted for an acute hemorrhagic stroke.  She was noted to have respiratory difficulty and unable to protect the airway.  She was intubated emergently.  She subsequently failed extubation attempts.  ENT was consulted for tracheostomy tube placement secondary to her large goiter.  Based on the above findings, the decision was made for the patient to undergo tracheostomy tube placement in the operating room. The risks, benefits, and details of the procedure were discussed with the family members by the critical care physician.  Informed consent was obtained.  DESCRIPTION OF PROCEDURE:  The patient was taken to the operating room and placed in the supine on the operating table.  General anesthesia was administered via the preexisting endotracheal tube.  The patient was positioned and prepped and draped in a standard fashion for tracheostomy tube placement.  A 1% lidocaine 1:100,000 epinephrine was injected at the planned site of incisions.  Palpation of the neck revealed a large thyroid goiter at midline.  A 2-cm vertical incision was made at midline.  Incision was carried  down to the level of the strap muscles. The strap muscles were divided at midline and retracted laterally.  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  The thyroid gland was divided at midline.  Hemostasis was achieved with Bovie electrocautery. The trachea was subsequently visualized.  A tracheal window was made at the second tracheal ring.  A #8 cuffed Shiley tracheostomy tube was placed without difficulty.  The tracheostomy tube was secured in place with 2-0 Prolene sutures.  Surgicel was also used to pack the surgical site.  A circumferential neck tie was placed.  The care of the patient was turned over to the anesthesiologist.  The patient was awakened from anesthesia without difficulty.  She was transferred back to the ICU in stable condition.  OPERATIVE FINDINGS:  A large midline thyroid goiter was noted.  A #8 cuffed Shiley tracheostomy tube was placed.  SPECIMEN:  None.  FOLLOWUP CARE:  The patient will be returned to the intensive care unit. Attempts will be made to wean the patient from the ventilator support.     Newman Pies, MD     ST/MEDQ  D:  05/31/2011  T:  06/01/2011  Job:  [id number]  Electronically Signed by Newman Pies MD on 06/18/2011 10:23:23 AM

## 2011-06-19 DIAGNOSIS — I629 Nontraumatic intracranial hemorrhage, unspecified: Secondary | ICD-10-CM

## 2011-06-19 DIAGNOSIS — Z930 Tracheostomy status: Secondary | ICD-10-CM

## 2011-06-19 LAB — GLUCOSE, CAPILLARY
Glucose-Capillary: 163 mg/dL — ABNORMAL HIGH (ref 70–99)
Glucose-Capillary: 126 mg/dL — ABNORMAL HIGH (ref 70–99)
Glucose-Capillary: 67 mg/dL — ABNORMAL LOW (ref 70–99)
Glucose-Capillary: 179 mg/dL — ABNORMAL HIGH (ref 70–99)

## 2011-06-20 LAB — GLUCOSE, CAPILLARY
Glucose-Capillary: 80 mg/dL (ref 70–99)
Glucose-Capillary: 59 mg/dL — ABNORMAL LOW (ref 70–99)
Glucose-Capillary: 132 mg/dL — ABNORMAL HIGH (ref 70–99)

## 2011-06-21 DIAGNOSIS — Z930 Tracheostomy status: Secondary | ICD-10-CM

## 2011-06-21 DIAGNOSIS — I629 Nontraumatic intracranial hemorrhage, unspecified: Secondary | ICD-10-CM

## 2011-06-21 LAB — GLUCOSE, CAPILLARY
Glucose-Capillary: 126 mg/dL — ABNORMAL HIGH (ref 70–99)
Glucose-Capillary: 178 mg/dL — ABNORMAL HIGH (ref 70–99)
Glucose-Capillary: 219 mg/dL — ABNORMAL HIGH (ref 70–99)

## 2011-06-27 ENCOUNTER — Encounter: Payer: Self-pay | Admitting: *Deleted

## 2011-06-27 ENCOUNTER — Emergency Department (HOSPITAL_BASED_OUTPATIENT_CLINIC_OR_DEPARTMENT_OTHER)
Admission: EM | Admit: 2011-06-27 | Discharge: 2011-06-27 | Disposition: A | Discharge status: Home / Self Care | Payer: Medicaid Other | Attending: Emergency Medicine | Admitting: Emergency Medicine

## 2011-06-27 DIAGNOSIS — I10 Essential (primary) hypertension: Secondary | ICD-10-CM | POA: Insufficient documentation

## 2011-06-27 DIAGNOSIS — Z79899 Other long term (current) drug therapy: Secondary | ICD-10-CM | POA: Insufficient documentation

## 2011-06-27 DIAGNOSIS — Z430 Encounter for attention to tracheostomy: Secondary | ICD-10-CM

## 2011-06-27 DIAGNOSIS — Z8679 Personal history of other diseases of the circulatory system: Secondary | ICD-10-CM | POA: Insufficient documentation

## 2011-06-27 DIAGNOSIS — B182 Chronic viral hepatitis C: Secondary | ICD-10-CM | POA: Insufficient documentation

## 2011-06-27 HISTORY — DX: Chronic viral hepatitis C: B18.2

## 2011-06-27 HISTORY — DX: Essential (primary) hypertension: I10

## 2011-06-27 HISTORY — DX: Cerebral infarction, unspecified: I63.9

## 2011-06-27 HISTORY — DX: Cocaine abuse, uncomplicated: F14.10

## 2011-06-27 HISTORY — DX: Acute kidney failure, unspecified: N17.9

## 2011-06-27 NOTE — ED Notes (Signed)
RT at bedside when patient arrived. She has 6.0 cuffed shiley with cuff deflated. She stated trach has never been changed, and trach care is not done on a regular basis. When I pulled out inner canula it was BLACK and gray, RN called to bedside to observe. I took of the dressings which were crusted to her skin and did trach care. Skin around stoma was stuck to trach with dried secretions, and when cleaned with Q-Tip bleeding was noted. Patient tolerated cleaning well, SAT 100% on RA. Moderate amount cloudy secretions. There is box of extra IC at her bedside at this time, ambu bag at [facility].

## 2011-06-27 NOTE — ED Notes (Signed)
EMS reports patient is here for routine tracheostomy change.  [facility] is unable to change and called [facility] to transport patient here for treatment.

## 2011-06-27 NOTE — Discharge Summary (Signed)
  Linda [name], Linda [name]              ACCOUNT NO.:  [id number]  MEDICAL RECORD NO.:  [id number]  LOCATION:  3002                         FACILITY:  [facility]  PHYSICIAN:  Tara C. Jernejcic, P.A.DATE OF BIRTH:  01-07-1956  DATE OF ADMISSION:  05/27/2011 DATE OF DISCHARGE:                        DISCHARGE SUMMARY - REFERRING   ADDENDUM:  The patient's discharge was held up due to bed availability. She was also pulling out her trach.  Critical Care Medicine felt the patient still needed her trach due to heavy secretions.  Respiratory Therapy will follow her at the facility.  On today's date, June 21, 2011, she was felt stable for discharge to nursing home facility. Discharge medications and instructions are unchanged from previous dictation.     Luan Moore, P.A.     TCJ/MEDQ  D:  06/21/2011  T:  06/21/2011  Job:  [id number]  cc:   [facility] Pramod P. Pearlean Brownie, MD Primary  Electronically Signed by Delice Bison JERNEJCIC P.A. on 06/23/2011 10:39:08 AM Electronically Signed by Delia Heady MD on 06/27/2011 10:28:25 PM

## 2011-06-27 NOTE — ED Notes (Signed)
Upon looking in echart, I discovered that the trach was placed on 10/6. She origianlly had a 8.0 cuffed trach, so it had to have been changed since then. RT to consult with MD.

## 2011-06-27 NOTE — ED Provider Notes (Signed)
History     CSN: [id number] Arrival date & time: 06/27/2011  2:39 PM   First MD Initiated Contact with Patient 06/27/11 1631      Chief Complaint  Patient presents with  . Tracheostomy Tube Change    (Consider location/radiation/quality/duration/timing/severity/associated sxs/prior treatment) HPI Comments: Patient is a 55 year old woman who had a tracheostomy placed for respiratory distress, during hospitalization after a hemorrhagic stroke. She has subsequently been placed in a nursing home. Apparently the nursing home felt she needed to have her tracheostomy tube change. Therefore she was sent to [facility] ED for this purpose.   Past Medical History  Diagnosis Date  . Hep C w/o coma, chronic   . Cocaine abuse   . Acute renal failure   . Hypertension   . Stroke     History reviewed. No pertinent past surgical history.  No family history on file.  History  Substance Use Topics  . Smoking status: Not on file  . Smokeless tobacco: Not on file  . Alcohol Use: No    OB History    Grav Para Term Preterm Abortions TAB SAB Ect Mult Living                  Review of Systems  All other systems reviewed and are negative.    Allergies  Review of patient's allergies indicates no known allergies.  Home Medications   Current Outpatient Rx  Name Route Sig Dispense Refill  . AMLODIPINE BESY-BENAZEPRIL HCL 10-20 MG PO CAPS Oral Take 1 capsule by mouth daily.      Marland Kitchen CLONAZEPAM 0.5 MG PO TABS Oral Take 0.5 mg by mouth 2 (two) times daily as needed. For anxiety    . HALOPERIDOL 2 MG PO TABS Oral Take 2 mg by mouth 2 (two) times daily.      Marland Kitchen METOCLOPRAMIDE HCL 10 MG/10ML PO SOLN Oral Take 10 mg by mouth as needed. For upset stomach     . CERTA-VITE PO LIQD Oral Take 5 mLs by mouth daily.      Marland Kitchen PANTOPRAZOLE SODIUM 40 MG PO TBEC Oral Take 40 mg by mouth daily.        BP 119/73  Pulse 62  Temp(Src) 98.2 F (36.8 C) (Oral)  SpO2 100%  Physical Exam    Constitutional: She is oriented to person, place, and time. She appears well-developed and well-nourished. No distress.  HENT:       She has a tracheostomy tube in place. Respiratory therapist has put a clean inner cannula in. The patient has no respiratory difficulty. He tracheostomy tube fits tightly in the stoma.  Eyes: Conjunctivae and EOM are normal. Pupils are equal, round, and reactive to light.  Neck: Normal range of motion. Neck supple.  Cardiovascular: Normal rate, regular rhythm and normal heart sounds.   Pulmonary/Chest: Effort normal and breath sounds normal.  Abdominal: Bowel sounds are normal.  Musculoskeletal: She exhibits no edema and no tenderness.  Neurological: She is alert and oriented to person, place, and time.       She has flaccid paralysis of the right side.  Skin: Skin is warm and dry.  Psychiatric: She has a normal mood and affect. Her behavior is normal.    ED Course  Procedures (including critical care time)  5:30 PM Patient was seen and had physical exam. She is having no respiratory distress. I contacted Newman Pies, M.D. ENT specialist who had placed her tracheostomy.    5:30 PM Pt's  case discussed with Dr. Suszanne Conners.  He recommends that the nursing home call his office next week, to arrange a time for the patient to go to [facility] ED, where he can meet the patient and change her tracheostomy tube.   1. Tracheostomy care          Carleene Cooper III, MD 06/27/11 1730

## 2011-06-27 NOTE — Discharge Summary (Signed)
Linda [name], Linda [name]              ACCOUNT NO.:  [id number]  MEDICAL RECORD NO.:  [id number]  LOCATION:  3002                         FACILITY:  [facility]  PHYSICIAN:  Dr. Delia Heady         DATE OF BIRTH:  05-Jun-1956  DATE OF ADMISSION:  05/27/2011 DATE OF DISCHARGE:  06/19/2011                        DISCHARGE SUMMARY - REFERRING   PRIMARY CARE PHYSICIAN:  Pramod P. Pearlean Brownie, MD  DISCHARGE DIAGNOSES: 1. 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 2. Acute respiratory failure; requiring tracheostomy for ventilatory     support (#6 cuffless trach on June 12, 2011); Speech Pathology     stated do not place valve until assessed by SLP given some     intermittent tolerance over the past 3 days prior to discharge to     nursing facility. 3. Tracheostomatitis, resolved with antibiotic therapy in hospital. 4. Dysphagia requiring PEG tube placement -  receiving Jevity at 55 an     hour, and pureed/honey thick orally by SLP only. 5. Hypertension, controlled. 6. Diabetes, requiring insulin. 7. Hepatitis C. 8. Cocaine abuse.  ADMISSION DIAGNOSIS: 1. Left basal ganglia intracranial hemorrhage 2. Malignant Hypertension 3. Cocaine abuse.  DISCHARGE MEDICATIONS: 1. Protonix 40 mg per tube daily. 2. Multivitamin 5 mL liquid per tube daily. 3. Antiseptic rinse 4 times daily by mouth, swish and spit. 4. Norvasc 10 mg by tube daily. 5. Lopressor 100 mg (50 mg/20 mL suspension) per tube b.i.d. 6. Haldol 0.5 mg (2 mg/mL) q.8 hours. 7. Clonazepam 0.25 mg per tube at bedtime. 8. Lantus 48 units subcu at bedtime. 9. Sliding scale insulin - NovoLog per facility sliding-scale protocol     q.4 hours. 10.Free water 160 mL per tube q.4 hours. 11.Glucerna 480 mL t.i.d. per tube.  HISTORY OF PRESENT ILLNESS:  [name] is a 55 year old African female who was found unresponsive at 3 o'clock in the afternoon on May 27, 2011 by her family.  Initially, she was thought to have seizures and facial droop at [facility]. Upon arrival to the ER there, pt  was unable to protect her airway and was intubated.  CT scan of the head showed  a 5.2 x 3.1 cm left putaminal basilar ganglion hemorrhage.  Blood pressure  was 190/110.  She was given labetalol and blood pressure.  Transfer arrangements were made to  [facility] for neuro ICU care.  En route, blood pressure spiked and patient became agitated. She was sedated with propofol. Her urine drug screen was positive for cocaine.  The patient was unable to provide history.  History was obtained from the transfer note from [facility]- family was not present during the initial examination.  Dr. Pearlean Brownie examined the patient once she arrived to [facility] and felt that she presented beyond 6 hours of onset of hemorrhage.  She remained critically ill and at significant risk for neurologic worsening- including repeat hemorrhage or death. She was admitted to the Neurological Intensive Care Unit.   Blood pressure will be managed aggressively with an aim of systolic blood pressure between 110 and 160.  We will use  IV labetalol p.r.n. and use an IV nicardipine drip to achieve those goals.  She will be kept sedated and intubated and have a repeat noncontrast CT scan of the head in the morning with subsequent MRI.  Pulmonary Critical Care will manage ventilator.  Her care and prognosis will be discussed with family once they arrive.  COURSE IN HOSPITAL:  The patient was admitted to the Neuro ICU. Dysphagia screen performed at that time was failed.  TPA was not given as a result of contraindication due to intracranial hemorrhage per CT findings.  PTE prophylaxis used with SCDs.  Antithrombotics were not used because of hemorrhage.  Lipids revealed an LDL of 76.  On May 29, 2011, the patient remained sedated on propofol.  She was flaccid  on the right, was able to move the left side purposefully. Withdrew to pain on the right.  She had up-gaze deviation.  Plan made to wean sedation and extubate if possible.  Prognosis guarded and it was decided to initiate Lovenox for DVT prophylaxis after repeat head CT revealed stable hemorrhage.  On May 30, 2011, the patient was extubated but remained in respiratory distress with stridor.  She was able to follow simple commands, eye movements were full.  Remained densely hemiparetic on the right.  The patient was scheduled for emergent tracheostomy for airway protection.  The family was informed.  Their questions were answered and procedure was completed.  On May 31, 2011, the patient was reintubated as the bedside trach failed.  Vent was slowly weaned,- patient came off vent on June 09, 2011.  On June 12, 2011, trach was changed from a #8 cuffed Shiley to a #6 cuffless Shiley.  The patient tolerated the procedure well and remained stable from respiratory standpoint for the remainder of the hospitalization.  Of note, Speech Pathology worked with the patient using a Building surveyor speaking valve.  However, due to varied levels of alertness and patient's infrequent efforts to clear secretions, the valve was to be used with SLP only.  During the time she was intubated, the patient had fevers - her white count remained within normal limits.  It was felt that she had tracheitis and possible PNA-had negative blood cultures, negative urine cultures, and trach culture showed no pathogens.  The patient was empirically treated with Ceftaz.     PEG tube which was placed by Dr. Randa Evens on June 05, 2011.  Nutrition was involved in managing her tube feeds.   On June 13, 2011, the patient was transferred to the floor. She was following simple commands.  Purposeful movements on the left and dense hemiparesis on the right.  The patient did have agitation requiring Haldol and  Klonopin.  Modifies barium swallow 10/19 confirmed need to remain NPO with trials of pureed diet, honey thick liquids with SLP only.  Occupational Therapy's treatment note, June 18, 2011 showed the patient had decreased safety awareness, but able to follow 1-step commands with verbal and visual clues.  Her affect was felt to be flat versus sad and the patient was attempted to pull her PEG when the binder was off. She required minimal assistance when supine, sitting on the edge of the bed and the patient was able to use rail with head of bed elevated to 30 degrees.  She was total assist to stand and pivot from the edge of the bed to a chair.  Assist was needed to reposition the right leg.  Physical Therapy found the patient to be able to  follow verbal commands with cuing 100% of the time.  They again noted her to be trying to pull PEG when the binder was off.  Speech Therapy assessed the patient on June 19, 2011 showing the patient to attempt to communicate with staff ineffectively.  They felt her cognition and awareness was improving, especially with sustained attention.   She was able to follow the 2-3 step commands with minimal assistance.  They again recommended to not place the valve until assessed by SLP given  intermittent intolerance over the last 3 days.  Nutrition on June 18, 2011 recommended Glucerna  1.2-2 cans t.i.d.  Providing  1710 calories, 85 g protein and 1952 mL of water.  They recommended free water flushes about of 160 mL q.4 hours.  Total free water and water flushes would provide 2102 mL of fluid.  This regimen would meet 100% of her calorie-protein and fluid needs.   On June 18, 2011, Wound Care had been following her trach site and felt that it was decreasing in size and depth with 100% beefy red, moderate green drainage of 1 x 1 x 3 cm.-using Aquacel and Tegaderm to promote healing.  IMAGES DURING HOSPITALIZATION:  MRI of the brain, June 01, 2011 showed left subinsular intracerebral hemorrhage with a proximal volume of 13 mL, atrophy and small vessel disease.  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  She had some atherosclerotic changes of the left ACA and distal bilateral MCA and PCA branches. Portable chest on June 10, 2011 showed mild vascular congestion, mild bibasilar atelectasis without significant interval change.  DISCHARGE LABORATORY STUDIES:  Sodium 136, potassium 4.2, BUN 28, creatinine 0.81.  CBG ranging in the 160s-170s.  WBC 6.7, hemoglobin 10.2, and platelet 383.  At the time of discharge, the patient is awake and alert following simple commands.  She has right facial weakness and has a flaccid hemiparesis on the right.  She is able to move the left side purposefully.  Blood pressure 153/75, pulse 68, and SaO2 100% on trach at 0.28.  DISCHARGE DIET:  Glucerna 1.2-2 cans t.i.d. with 160 mL H2O q.4 hours per tube.  DISCHARGE PLAN:  The patient will be discharged to [facility].  She will be evaluated by their PT/OT and Speech Therapy staff with continued therapies as recommended.  She is not on anti-platelet therapy due to her hemorrhage.  She did have an A1c of 6.6 in the hospital.  She had some elevated blood sugars with her tube feeds.  This will need to be monitored and tight control of diabetes will need to be managed by the outpatient physician. Additionally, blood pressure control was critical.  Finally, counseling on avoidance of illicit drugs, alcohol, and tobacco is encouraged.  The patient needs to follow up with her primary at the nursing facility. She will follow up with Dr. Pearlean Brownie in 2 months.     Luan Moore, P.A.     TCJ/MEDQ  D:  06/19/2011  T:  06/19/2011  Job:  [id number]  cc:   Dr. Renaee Munda P. Pearlean Brownie, MD  Electronically Signed by  Delice Bison JERNEJCIC P.A. on 06/23/2011 10:38:48 AM Electronically Signed by Delia Heady MD on 06/27/2011 10:28:22 PM

## 2011-06-29 ENCOUNTER — Emergency Department (HOSPITAL_COMMUNITY)
Admission: EM | Admit: 2011-06-29 | Discharge: 2011-06-29 | Disposition: A | Discharge status: Home / Self Care | Payer: Medicaid Other | Attending: Emergency Medicine | Admitting: Emergency Medicine

## 2011-06-29 ENCOUNTER — Emergency Department (HOSPITAL_COMMUNITY): Payer: Medicaid Other

## 2011-06-29 DIAGNOSIS — Z794 Long term (current) use of insulin: Secondary | ICD-10-CM | POA: Insufficient documentation

## 2011-06-29 DIAGNOSIS — R0609 Other forms of dyspnea: Secondary | ICD-10-CM | POA: Insufficient documentation

## 2011-06-29 DIAGNOSIS — R0989 Other specified symptoms and signs involving the circulatory and respiratory systems: Secondary | ICD-10-CM | POA: Insufficient documentation

## 2011-06-29 DIAGNOSIS — R05 Cough: Secondary | ICD-10-CM | POA: Insufficient documentation

## 2011-06-29 DIAGNOSIS — R0602 Shortness of breath: Secondary | ICD-10-CM | POA: Insufficient documentation

## 2011-06-29 DIAGNOSIS — R059 Cough, unspecified: Secondary | ICD-10-CM | POA: Insufficient documentation

## 2011-06-29 DIAGNOSIS — E119 Type 2 diabetes mellitus without complications: Secondary | ICD-10-CM | POA: Insufficient documentation

## 2011-06-29 DIAGNOSIS — Z8619 Personal history of other infectious and parasitic diseases: Secondary | ICD-10-CM | POA: Insufficient documentation

## 2011-06-29 DIAGNOSIS — I10 Essential (primary) hypertension: Secondary | ICD-10-CM | POA: Insufficient documentation

## 2011-06-29 DIAGNOSIS — Z79899 Other long term (current) drug therapy: Secondary | ICD-10-CM | POA: Insufficient documentation

## 2011-11-04 ENCOUNTER — Emergency Department: Payer: Self-pay | Admitting: Emergency Medicine

## 2011-11-04 LAB — COMPREHENSIVE METABOLIC PANEL
Anion Gap: 8 (ref 7–16)
BUN: 23 mg/dL — ABNORMAL HIGH (ref 7–18)
Bilirubin,Total: 0.4 mg/dL (ref 0.2–1.0)
Chloride: 103 mmol/L (ref 98–107)
Co2: 27 mmol/L (ref 21–32)
Creatinine: 1.1 mg/dL (ref 0.60–1.30)
EGFR (African American): >60
EGFR (Non-African Amer.): 54 — ABNORMAL LOW
Osmolality: 284 (ref 275–301)
Potassium: 4.4 mmol/L (ref 3.5–5.1)
Sodium: 138 mmol/L (ref 136–145)
Total Protein: 8.8 g/dL — ABNORMAL HIGH (ref 6.4–8.2)

## 2011-11-04 LAB — URINALYSIS, COMPLETE
Bilirubin,UR: NEGATIVE
Blood: NEGATIVE
Glucose,UR: NEGATIVE mg/dL (ref 0–75)
Hyaline Cast: 1
Nitrite: NEGATIVE
WBC UR: 7 /HPF (ref 0–5)

## 2011-11-04 LAB — CBC
HGB: 11.3 g/dL — ABNORMAL LOW (ref 12.0–16.0)
MCH: 34.3 pg — ABNORMAL HIGH (ref 26.0–34.0)
MCV: 99 fL (ref 80–100)
RBC: 3.28 10*6/uL — ABNORMAL LOW (ref 3.80–5.20)

## 2011-11-13 ENCOUNTER — Emergency Department: Payer: Self-pay | Admitting: Unknown Physician Specialty

## 2011-11-13 LAB — CBC
HCT: 33 % — ABNORMAL LOW (ref 35.0–47.0)
MCH: 32.7 pg (ref 26.0–34.0)
MCHC: 33.4 g/dL (ref 32.0–36.0)
MCV: 98 fL (ref 80–100)
RDW: 14.1 % (ref 11.5–14.5)

## 2011-11-13 LAB — COMPREHENSIVE METABOLIC PANEL
Albumin: 3.7 g/dL (ref 3.4–5.0)
Alkaline Phosphatase: 102 U/L (ref 50–136)
Bilirubin,Total: 0.5 mg/dL (ref 0.2–1.0)
Co2: 27 mmol/L (ref 21–32)
Creatinine: 1.32 mg/dL — ABNORMAL HIGH (ref 0.60–1.30)
EGFR (Non-African Amer.): 44 — ABNORMAL LOW
Osmolality: 289 (ref 275–301)
Sodium: 141 mmol/L (ref 136–145)
Total Protein: 8.7 g/dL — ABNORMAL HIGH (ref 6.4–8.2)

## 2011-11-13 LAB — TROPONIN I: Troponin-I: <0.02 ng/mL

## 2011-11-13 LAB — URINALYSIS, COMPLETE
Bilirubin,UR: NEGATIVE
Ketone: NEGATIVE
Ph: 5 (ref 4.5–8.0)
Specific Gravity: 1.016 (ref 1.003–1.030)
Squamous Epithelial: 2

## 2012-01-07 ENCOUNTER — Ambulatory Visit: Payer: Self-pay | Admitting: Family Medicine

## 2012-02-19 ENCOUNTER — Ambulatory Visit: Payer: Self-pay | Admitting: Gastroenterology

## 2012-04-10 ENCOUNTER — Emergency Department: Payer: Self-pay | Admitting: Emergency Medicine

## 2012-04-10 LAB — DRUG SCREEN, URINE
Barbiturates, Ur Screen: NEGATIVE (ref ?–200)
Benzodiazepine, Ur Scrn: NEGATIVE (ref ?–200)
Cannabinoid 50 Ng, Ur ~~LOC~~: NEGATIVE (ref ?–50)
Methadone, Ur Screen: NEGATIVE (ref ?–300)
Tricyclic, Ur Screen: POSITIVE (ref ?–1000)

## 2012-04-10 LAB — CBC
HCT: 32.5 % — ABNORMAL LOW (ref 35.0–47.0)
HGB: 11.2 g/dL — ABNORMAL LOW (ref 12.0–16.0)
MCH: 32.1 pg (ref 26.0–34.0)
MCV: 93 fL (ref 80–100)
RBC: 3.48 10*6/uL — ABNORMAL LOW (ref 3.80–5.20)

## 2012-04-10 LAB — COMPREHENSIVE METABOLIC PANEL
Anion Gap: 4 — ABNORMAL LOW (ref 7–16)
BUN: 18 mg/dL (ref 7–18)
Bilirubin,Total: 0.4 mg/dL (ref 0.2–1.0)
Chloride: 106 mmol/L (ref 98–107)
Co2: 29 mmol/L (ref 21–32)
Creatinine: 1.15 mg/dL (ref 0.60–1.30)
EGFR (African American): >60
EGFR (Non-African Amer.): 53 — ABNORMAL LOW
Osmolality: 279 (ref 275–301)
Potassium: 5 mmol/L (ref 3.5–5.1)
Total Protein: 9 g/dL — ABNORMAL HIGH (ref 6.4–8.2)

## 2012-04-10 LAB — URINALYSIS, COMPLETE
Bacteria: NONE SEEN
Glucose,UR: NEGATIVE mg/dL (ref 0–75)
Nitrite: NEGATIVE
Specific Gravity: 1.012 (ref 1.003–1.030)
WBC UR: 15 /HPF (ref 0–5)

## 2012-04-10 LAB — ETHANOL
Ethanol %: <0.003 % (ref 0.000–0.080)
Ethanol: <3 mg/dL

## 2012-04-10 LAB — PROTIME-INR
INR: 1
Prothrombin Time: 13.1 secs (ref 11.5–14.7)

## 2014-01-18 ENCOUNTER — Ambulatory Visit: Payer: Self-pay | Admitting: Family Medicine

## 2014-01-18 ENCOUNTER — Ambulatory Visit: Payer: Self-pay | Admitting: Otolaryngology

## 2014-02-17 ENCOUNTER — Ambulatory Visit: Payer: Self-pay | Admitting: Otolaryngology

## 2014-02-17 LAB — DRUG SCREEN, URINE
Amphetamines, Ur Screen: NEGATIVE (ref ?–1000)
Barbiturates, Ur Screen: NEGATIVE (ref ?–200)
Benzodiazepine, Ur Scrn: NEGATIVE (ref ?–200)
Cannabinoid 50 Ng, Ur ~~LOC~~: NEGATIVE (ref ?–50)
Cocaine Metabolite,Ur ~~LOC~~: POSITIVE (ref ?–300)
MDMA (Ecstasy)Ur Screen: NEGATIVE (ref ?–500)
METHADONE, UR SCREEN: NEGATIVE (ref ?–300)
Opiate, Ur Screen: NEGATIVE (ref ?–300)
Phencyclidine (PCP) Ur S: NEGATIVE (ref ?–25)
TRICYCLIC, UR SCREEN: POSITIVE (ref ?–1000)

## 2014-02-28 ENCOUNTER — Ambulatory Visit: Payer: Self-pay | Admitting: Otolaryngology

## 2014-02-28 LAB — CBC WITH DIFFERENTIAL/PLATELET
Comment - H1-Com5: NORMAL
EOS PCT: 1 %
HCT: 33.7 % — ABNORMAL LOW (ref 35.0–47.0)
HGB: 11.7 g/dL — ABNORMAL LOW (ref 12.0–16.0)
Lymphocytes: 71 %
MCH: 31.8 pg (ref 26.0–34.0)
MCHC: 34.7 g/dL (ref 32.0–36.0)
MCV: 91 fL (ref 80–100)
MONOS PCT: 6 %
PLATELETS: 235 10*3/uL (ref 150–440)
RBC: 3.68 10*6/uL — AB (ref 3.80–5.20)
RDW: 13.3 % (ref 11.5–14.5)
Segmented Neutrophils: 22 %
WBC: 6.2 10*3/uL (ref 3.6–11.0)

## 2014-02-28 LAB — DRUG SCREEN, URINE
Amphetamines, Ur Screen: NEGATIVE (ref ?–1000)
BARBITURATES, UR SCREEN: NEGATIVE (ref ?–200)
BENZODIAZEPINE, UR SCRN: NEGATIVE (ref ?–200)
COCAINE METABOLITE, UR ~~LOC~~: POSITIVE (ref ?–300)
Cannabinoid 50 Ng, Ur ~~LOC~~: NEGATIVE (ref ?–50)
MDMA (Ecstasy)Ur Screen: NEGATIVE (ref ?–500)
Methadone, Ur Screen: NEGATIVE (ref ?–300)
Opiate, Ur Screen: NEGATIVE (ref ?–300)
PHENCYCLIDINE (PCP) UR S: NEGATIVE (ref ?–25)
TRICYCLIC, UR SCREEN: POSITIVE (ref ?–1000)

## 2014-02-28 LAB — BASIC METABOLIC PANEL
Anion Gap: 5 — ABNORMAL LOW (ref 7–16)
BUN: 14 mg/dL (ref 7–18)
CALCIUM: 8.5 mg/dL (ref 8.5–10.1)
CHLORIDE: 105 mmol/L (ref 98–107)
Co2: 29 mmol/L (ref 21–32)
Creatinine: 1.27 mg/dL (ref 0.60–1.30)
EGFR (Non-African Amer.): 46 — ABNORMAL LOW
GFR CALC AF AMER: 53 — AB
GLUCOSE: 206 mg/dL — AB (ref 65–99)
Osmolality: 284 (ref 275–301)
Potassium: 3.9 mmol/L (ref 3.5–5.1)
Sodium: 139 mmol/L (ref 136–145)

## 2014-10-27 ENCOUNTER — Emergency Department: Payer: Self-pay | Admitting: Internal Medicine

## 2014-12-20 ENCOUNTER — Ambulatory Visit
Admit: 2014-12-20 | Disposition: A | Discharge status: Home / Self Care | Payer: Self-pay | Attending: Nurse Practitioner | Admitting: Nurse Practitioner

## 2015-07-31 ENCOUNTER — Other Ambulatory Visit: Payer: Self-pay | Admitting: Preventative Medicine

## 2015-07-31 DIAGNOSIS — Z1231 Encounter for screening mammogram for malignant neoplasm of breast: Secondary | ICD-10-CM

## 2015-08-25 ENCOUNTER — Other Ambulatory Visit: Payer: Self-pay | Admitting: Preventative Medicine

## 2015-08-25 DIAGNOSIS — I82403 Acute embolism and thrombosis of unspecified deep veins of lower extremity, bilateral: Secondary | ICD-10-CM

## 2015-08-29 ENCOUNTER — Ambulatory Visit
Admission: RE | Admit: 2015-08-29 | Discharge: 2015-08-29 | Disposition: A | Discharge status: Home / Self Care | Payer: Medicaid Other | Source: Ambulatory Visit | Attending: Preventative Medicine | Admitting: Preventative Medicine

## 2015-08-29 DIAGNOSIS — M7989 Other specified soft tissue disorders: Secondary | ICD-10-CM | POA: Insufficient documentation

## 2015-08-29 DIAGNOSIS — I82403 Acute embolism and thrombosis of unspecified deep veins of lower extremity, bilateral: Secondary | ICD-10-CM

## 2015-09-12 ENCOUNTER — Other Ambulatory Visit: Payer: Self-pay | Admitting: Preventative Medicine

## 2015-09-12 DIAGNOSIS — R1310 Dysphagia, unspecified: Secondary | ICD-10-CM

## 2015-09-18 ENCOUNTER — Ambulatory Visit
Admission: RE | Admit: 2015-09-18 | Discharge: 2015-09-18 | Disposition: A | Discharge status: Home / Self Care | Payer: Medicaid Other | Source: Ambulatory Visit | Attending: Preventative Medicine | Admitting: Preventative Medicine

## 2015-09-18 DIAGNOSIS — R1312 Dysphagia, oropharyngeal phase: Secondary | ICD-10-CM

## 2015-09-18 DIAGNOSIS — R1310 Dysphagia, unspecified: Secondary | ICD-10-CM | POA: Diagnosis present

## 2015-09-18 NOTE — Therapy (Signed)
[facility] DIAGNOSTIC RADIOLOGY [address] [place], [place], [address] Phone: [phone]   Fax:     Modified Barium Swallow  Patient Details  Name: LEIA COLETTI MRN: [id number] Date of Birth: Sep 26, 1955 No Data Recorded  Encounter Date: 09/18/2015      End of Session - 09/18/15 1544    Visit Number 1   Number of Visits 1   Date for SLP Re-Evaluation 09/18/15   SLP Start Time 1300   SLP Stop Time  1344   SLP Time Calculation (min) 44 min   Activity Tolerance Patient tolerated treatment well      Past Medical History  Diagnosis Date  . Hep C w/o coma, chronic   . Cocaine abuse   . Acute renal failure   . Hypertension   . Stroke     No past surgical history on file.  There were no vitals filed for this visit.  Visit Diagnosis: Oropharyngeal dysphagia  Dysphagia - Plan: DG SWALLOWING FUNC-SPEECH PATHOLOGY, DG SWALLOWING FUNC-SPEECH PATHOLOGY      Subjective: Patient behavior: (alertness, ability to follow instructions, etc.): Patient became tearful as she was giving her swallowing history.  She reports that she had a PEG and a trach after a CVA in 2012, but both were discontinued within several months.  She was not able to relate any specific difficulties swallowing.  Chief complaint: Patient states "I can't talk"  "I give out of breath"   Objective:  Radiological Procedure: A videoflouroscopic evaluation of oral-preparatory, reflex initiation, and pharyngeal phases of the swallow was performed; as well as a screening of the upper esophageal phase.  I. POSTURE: Upright in MBS chair  II. VIEW: Lateral  III. COMPENSATORY STRATEGIES: N/A  IV. BOLUSES ADMINISTERED:   Thin Liquid: 2 small sips, 3 rapid consecutive sips   Nectar-thick Liquid: 1 large sip    Puree: 2 teaspoon boluses   Mechanical Soft: 1/4 graham cracker in applesauce  V. RESULTS OF EVALUATION: A. ORAL PREPARATORY PHASE: (The lips, tongue, and  velum are observed for strength and coordination): Within normal limits        **Overall Severity Rating: WNL  B. SWALLOW INITIATION/REFLEX: (The reflex is normal if "triggered" by the time the bolus reached the base of the tongue) Triggers while falling from the valleculae to the pyriform sinuses  **Overall Severity Rating: Mild  C. PHARYNGEAL PHASE: (Pharyngeal function is normal if the bolus shows rapid, smooth, and continuous transit through the pharynx and there is no pharyngeal residue after the swallow) Within normal limits  **Overall Severity Rating: WNL  D. LARYNGEAL PENETRATION: (Material entering into the laryngeal inlet/vestibule but not aspirated) Consistent flash penetration of liquid without laryngeal vestibule residue   E. ASPIRATION: None  F. ESOPHAGEAL PHASE: (Screening of the upper esophagus) No abnormalities within the viewable cervical esophagus  ASSESSMENT: 60 year old woman; with history of CVA, trach, and PEG in 2012 with current complaint of difficulty breathing affecting her speech and swallowing; is presenting with minimal oropharyngeal dysphagia.  Oral control of the bolus including oral hold, rotary mastication, and anterior to posterior transfer is within functional limits. Timing of the pharyngeal swallow is delayed, triggering as the bolus falls from the valleculae to the pyriform sinuses.  Pharyngeal aspects of the swallow including hyolaryngeal excursion, pharyngeal pressure generation, epiglottic inversion, duration/amplitude of UES opening, and laryngeal vestibule closure at the height of the swallow are within normal limits.  There is no observed pharyngeal residue or tracheal  aspiration.  With liquids, there is consistent transient laryngeal penetration, all penetrated material is squeezed out with completion of the swallow.  The patient does not appear to be at significant risk for aspiration and oropharyngeal dysphagia does not appear to be the etiology of her  difficulty breathing.  The patient did become short of breath during the study, with no observable ill effects on swallow function.  The patient was able to calm her breathing given cues.  The patient was advised to try "rescue breathing" (pursed lip breathing) when she feels anxious.  She and her caregiver demonstrated good performance.  PLAN/RECOMMENDATIONS:   A. Diet: Regular  B. Swallowing Precautions: Standard   C. Recommended consultation to ENT for trach site healing   D. Therapy recommendations N/A   E. Results and recommendations were the patient caregiver in attendance immediately following the study and the final report will be routed to the referring MD.     Problem List There are no active problems to display for this patient.  Dollene Primrose, MS/CCC- SLP  Leandrew Koyanagi 09/18/2015, 3:45 PM  [facility] DIAGNOSTIC RADIOLOGY [address] [place], [place], [address] Phone: [phone]   Fax:     Name: TONJIA PARILLO MRN: [id number] Date of Birth: 1956-03-29

## 2015-09-28 ENCOUNTER — Inpatient Hospital Stay (HOSPITAL_COMMUNITY): Payer: Medicaid Other

## 2015-09-28 ENCOUNTER — Encounter (HOSPITAL_COMMUNITY)
Admission: EM | Disposition: A | Discharge status: Skilled Nursing Facility | Payer: Self-pay | Source: Home / Self Care | Attending: Neurology

## 2015-09-28 ENCOUNTER — Emergency Department (HOSPITAL_COMMUNITY): Payer: Medicaid Other

## 2015-09-28 ENCOUNTER — Encounter (HOSPITAL_COMMUNITY): Payer: Self-pay

## 2015-09-28 ENCOUNTER — Emergency Department (HOSPITAL_COMMUNITY): Payer: Medicaid Other | Admitting: Certified Registered Nurse Anesthetist

## 2015-09-28 ENCOUNTER — Inpatient Hospital Stay (HOSPITAL_COMMUNITY)
Admission: EM | Admit: 2015-09-28 | Discharge: 2015-10-25 | DRG: 003 | Disposition: A | Discharge status: Skilled Nursing Facility | Payer: Medicaid Other | Attending: Neurology | Admitting: Neurology

## 2015-09-28 ENCOUNTER — Emergency Department
Admission: EM | Admit: 2015-09-28 | Discharge: 2015-09-28 | Disposition: A | Discharge status: Other Acute Inpatient Hospital | Payer: Medicaid Other | Attending: Emergency Medicine | Admitting: Emergency Medicine

## 2015-09-28 ENCOUNTER — Ambulatory Visit (HOSPITAL_COMMUNITY): Payer: Medicaid Other

## 2015-09-28 ENCOUNTER — Emergency Department: Payer: Medicaid Other

## 2015-09-28 DIAGNOSIS — I63232 Cerebral infarction due to unspecified occlusion or stenosis of left carotid arteries: Secondary | ICD-10-CM | POA: Diagnosis not present

## 2015-09-28 DIAGNOSIS — R451 Restlessness and agitation: Secondary | ICD-10-CM | POA: Diagnosis not present

## 2015-09-28 DIAGNOSIS — R29810 Facial weakness: Secondary | ICD-10-CM | POA: Diagnosis present

## 2015-09-28 DIAGNOSIS — D6489 Other specified anemias: Secondary | ICD-10-CM | POA: Diagnosis present

## 2015-09-28 DIAGNOSIS — G936 Cerebral edema: Secondary | ICD-10-CM | POA: Diagnosis present

## 2015-09-28 DIAGNOSIS — Z515 Encounter for palliative care: Secondary | ICD-10-CM | POA: Insufficient documentation

## 2015-09-28 DIAGNOSIS — Y95 Nosocomial condition: Secondary | ICD-10-CM | POA: Diagnosis not present

## 2015-09-28 DIAGNOSIS — L8992 Pressure ulcer of unspecified site, stage 2: Secondary | ICD-10-CM | POA: Diagnosis not present

## 2015-09-28 DIAGNOSIS — Z6837 Body mass index (BMI) 37.0-37.9, adult: Secondary | ICD-10-CM | POA: Diagnosis not present

## 2015-09-28 DIAGNOSIS — Z7189 Other specified counseling: Secondary | ICD-10-CM | POA: Insufficient documentation

## 2015-09-28 DIAGNOSIS — B182 Chronic viral hepatitis C: Secondary | ICD-10-CM | POA: Diagnosis present

## 2015-09-28 DIAGNOSIS — N182 Chronic kidney disease, stage 2 (mild): Secondary | ICD-10-CM | POA: Diagnosis present

## 2015-09-28 DIAGNOSIS — Z794 Long term (current) use of insulin: Secondary | ICD-10-CM | POA: Diagnosis not present

## 2015-09-28 DIAGNOSIS — J15212 Pneumonia due to Methicillin resistant Staphylococcus aureus: Secondary | ICD-10-CM | POA: Diagnosis not present

## 2015-09-28 DIAGNOSIS — I69351 Hemiplegia and hemiparesis following cerebral infarction affecting right dominant side: Secondary | ICD-10-CM

## 2015-09-28 DIAGNOSIS — I639 Cerebral infarction, unspecified: Secondary | ICD-10-CM

## 2015-09-28 DIAGNOSIS — E872 Acidosis: Secondary | ICD-10-CM | POA: Diagnosis present

## 2015-09-28 DIAGNOSIS — I10 Essential (primary) hypertension: Secondary | ICD-10-CM | POA: Diagnosis not present

## 2015-09-28 DIAGNOSIS — Z9911 Dependence on respirator [ventilator] status: Secondary | ICD-10-CM | POA: Insufficient documentation

## 2015-09-28 DIAGNOSIS — L7682 Other postprocedural complications of skin and subcutaneous tissue: Secondary | ICD-10-CM | POA: Diagnosis not present

## 2015-09-28 DIAGNOSIS — E8770 Fluid overload, unspecified: Secondary | ICD-10-CM | POA: Diagnosis not present

## 2015-09-28 DIAGNOSIS — G9340 Encephalopathy, unspecified: Secondary | ICD-10-CM | POA: Diagnosis present

## 2015-09-28 DIAGNOSIS — Z430 Encounter for attention to tracheostomy: Secondary | ICD-10-CM

## 2015-09-28 DIAGNOSIS — B9620 Unspecified Escherichia coli [E. coli] as the cause of diseases classified elsewhere: Secondary | ICD-10-CM | POA: Diagnosis not present

## 2015-09-28 DIAGNOSIS — R55 Syncope and collapse: Secondary | ICD-10-CM | POA: Diagnosis present

## 2015-09-28 DIAGNOSIS — F1310 Sedative, hypnotic or anxiolytic abuse, uncomplicated: Secondary | ICD-10-CM | POA: Diagnosis not present

## 2015-09-28 DIAGNOSIS — I129 Hypertensive chronic kidney disease with stage 1 through stage 4 chronic kidney disease, or unspecified chronic kidney disease: Secondary | ICD-10-CM | POA: Diagnosis present

## 2015-09-28 DIAGNOSIS — E870 Hyperosmolality and hypernatremia: Secondary | ICD-10-CM | POA: Diagnosis not present

## 2015-09-28 DIAGNOSIS — I679 Cerebrovascular disease, unspecified: Secondary | ICD-10-CM

## 2015-09-28 DIAGNOSIS — R509 Fever, unspecified: Secondary | ICD-10-CM | POA: Diagnosis not present

## 2015-09-28 DIAGNOSIS — Z6836 Body mass index (BMI) 36.0-36.9, adult: Secondary | ICD-10-CM

## 2015-09-28 DIAGNOSIS — I63312 Cerebral infarction due to thrombosis of left middle cerebral artery: Secondary | ICD-10-CM | POA: Diagnosis not present

## 2015-09-28 DIAGNOSIS — E669 Obesity, unspecified: Secondary | ICD-10-CM | POA: Insufficient documentation

## 2015-09-28 DIAGNOSIS — I672 Cerebral atherosclerosis: Secondary | ICD-10-CM | POA: Diagnosis present

## 2015-09-28 DIAGNOSIS — F1410 Cocaine abuse, uncomplicated: Secondary | ICD-10-CM | POA: Insufficient documentation

## 2015-09-28 DIAGNOSIS — Y838 Other surgical procedures as the cause of abnormal reaction of the patient, or of later complication, without mention of misadventure at the time of the procedure: Secondary | ICD-10-CM | POA: Diagnosis not present

## 2015-09-28 DIAGNOSIS — Z79899 Other long term (current) drug therapy: Secondary | ICD-10-CM | POA: Insufficient documentation

## 2015-09-28 DIAGNOSIS — R4182 Altered mental status, unspecified: Secondary | ICD-10-CM

## 2015-09-28 DIAGNOSIS — I38 Endocarditis, valve unspecified: Secondary | ICD-10-CM | POA: Diagnosis present

## 2015-09-28 DIAGNOSIS — L98499 Non-pressure chronic ulcer of skin of other sites with unspecified severity: Secondary | ICD-10-CM | POA: Diagnosis not present

## 2015-09-28 DIAGNOSIS — R7881 Bacteremia: Secondary | ICD-10-CM | POA: Insufficient documentation

## 2015-09-28 DIAGNOSIS — J9690 Respiratory failure, unspecified, unspecified whether with hypoxia or hypercapnia: Secondary | ICD-10-CM | POA: Insufficient documentation

## 2015-09-28 DIAGNOSIS — E785 Hyperlipidemia, unspecified: Secondary | ICD-10-CM | POA: Diagnosis present

## 2015-09-28 DIAGNOSIS — I6330 Cerebral infarction due to thrombosis of unspecified cerebral artery: Secondary | ICD-10-CM | POA: Diagnosis not present

## 2015-09-28 DIAGNOSIS — I160 Hypertensive urgency: Secondary | ICD-10-CM | POA: Diagnosis present

## 2015-09-28 DIAGNOSIS — L89309 Pressure ulcer of unspecified buttock, unspecified stage: Secondary | ICD-10-CM | POA: Diagnosis not present

## 2015-09-28 DIAGNOSIS — J9601 Acute respiratory failure with hypoxia: Secondary | ICD-10-CM | POA: Diagnosis not present

## 2015-09-28 DIAGNOSIS — L089 Local infection of the skin and subcutaneous tissue, unspecified: Secondary | ICD-10-CM

## 2015-09-28 DIAGNOSIS — B9562 Methicillin resistant Staphylococcus aureus infection as the cause of diseases classified elsewhere: Secondary | ICD-10-CM | POA: Diagnosis not present

## 2015-09-28 DIAGNOSIS — D72829 Elevated white blood cell count, unspecified: Secondary | ICD-10-CM | POA: Insufficient documentation

## 2015-09-28 DIAGNOSIS — S301XXA Contusion of abdominal wall, initial encounter: Secondary | ICD-10-CM

## 2015-09-28 DIAGNOSIS — Z9289 Personal history of other medical treatment: Secondary | ICD-10-CM

## 2015-09-28 DIAGNOSIS — R0602 Shortness of breath: Secondary | ICD-10-CM

## 2015-09-28 DIAGNOSIS — B957 Other staphylococcus as the cause of diseases classified elsewhere: Secondary | ICD-10-CM | POA: Diagnosis not present

## 2015-09-28 DIAGNOSIS — Z22322 Carrier or suspected carrier of Methicillin resistant Staphylococcus aureus: Secondary | ICD-10-CM | POA: Diagnosis not present

## 2015-09-28 DIAGNOSIS — J9610 Chronic respiratory failure, unspecified whether with hypoxia or hypercapnia: Secondary | ICD-10-CM | POA: Diagnosis not present

## 2015-09-28 DIAGNOSIS — Z8673 Personal history of transient ischemic attack (TIA), and cerebral infarction without residual deficits: Secondary | ICD-10-CM | POA: Diagnosis not present

## 2015-09-28 DIAGNOSIS — R1319 Other dysphagia: Secondary | ICD-10-CM | POA: Diagnosis present

## 2015-09-28 DIAGNOSIS — I6300 Cerebral infarction due to thrombosis of unspecified precerebral artery: Secondary | ICD-10-CM | POA: Diagnosis not present

## 2015-09-28 DIAGNOSIS — D62 Acute posthemorrhagic anemia: Secondary | ICD-10-CM | POA: Diagnosis not present

## 2015-09-28 DIAGNOSIS — I429 Cardiomyopathy, unspecified: Secondary | ICD-10-CM | POA: Diagnosis not present

## 2015-09-28 DIAGNOSIS — L8990 Pressure ulcer of unspecified site, unspecified stage: Secondary | ICD-10-CM | POA: Insufficient documentation

## 2015-09-28 DIAGNOSIS — I63512 Cerebral infarction due to unspecified occlusion or stenosis of left middle cerebral artery: Secondary | ICD-10-CM | POA: Diagnosis present

## 2015-09-28 DIAGNOSIS — L7622 Postprocedural hemorrhage and hematoma of skin and subcutaneous tissue following other procedure: Secondary | ICD-10-CM | POA: Diagnosis not present

## 2015-09-28 DIAGNOSIS — T814XXA Infection following a procedure, initial encounter: Secondary | ICD-10-CM | POA: Diagnosis not present

## 2015-09-28 DIAGNOSIS — I6350 Cerebral infarction due to unspecified occlusion or stenosis of unspecified cerebral artery: Secondary | ICD-10-CM | POA: Diagnosis not present

## 2015-09-28 DIAGNOSIS — R4701 Aphasia: Secondary | ICD-10-CM | POA: Diagnosis present

## 2015-09-28 DIAGNOSIS — Z452 Encounter for adjustment and management of vascular access device: Secondary | ICD-10-CM

## 2015-09-28 DIAGNOSIS — E1165 Type 2 diabetes mellitus with hyperglycemia: Secondary | ICD-10-CM | POA: Diagnosis present

## 2015-09-28 DIAGNOSIS — Z930 Tracheostomy status: Secondary | ICD-10-CM | POA: Diagnosis not present

## 2015-09-28 DIAGNOSIS — J189 Pneumonia, unspecified organism: Secondary | ICD-10-CM | POA: Diagnosis not present

## 2015-09-28 DIAGNOSIS — Q251 Coarctation of aorta: Secondary | ICD-10-CM | POA: Insufficient documentation

## 2015-09-28 DIAGNOSIS — I63031 Cerebral infarction due to thrombosis of right carotid artery: Secondary | ICD-10-CM | POA: Diagnosis not present

## 2015-09-28 DIAGNOSIS — I63412 Cerebral infarction due to embolism of left middle cerebral artery: Secondary | ICD-10-CM | POA: Diagnosis not present

## 2015-09-28 DIAGNOSIS — I6340 Cerebral infarction due to embolism of unspecified cerebral artery: Secondary | ICD-10-CM | POA: Diagnosis not present

## 2015-09-28 DIAGNOSIS — Z4659 Encounter for fitting and adjustment of other gastrointestinal appliance and device: Secondary | ICD-10-CM

## 2015-09-28 DIAGNOSIS — Z0189 Encounter for other specified special examinations: Secondary | ICD-10-CM

## 2015-09-28 DIAGNOSIS — R1310 Dysphagia, unspecified: Secondary | ICD-10-CM

## 2015-09-28 DIAGNOSIS — I63032 Cerebral infarction due to thrombosis of left carotid artery: Secondary | ICD-10-CM | POA: Diagnosis not present

## 2015-09-28 DIAGNOSIS — I63039 Cerebral infarction due to thrombosis of unspecified carotid artery: Secondary | ICD-10-CM | POA: Diagnosis not present

## 2015-09-28 HISTORY — PX: RADIOLOGY WITH ANESTHESIA: SHX6223

## 2015-09-28 LAB — I-STAT ARTERIAL BLOOD GAS, ED
ACID-BASE DEFICIT: 2 mmol/L (ref 0.0–2.0)
BICARBONATE: 26.7 meq/L — AB (ref 20.0–24.0)
O2 SAT: 99 %
PO2 ART: 180 mmHg — AB (ref 80.0–100.0)
TCO2: 29 mmol/L (ref 0–100)
pCO2 arterial: 60.3 mmHg (ref 35.0–45.0)
pH, Arterial: 7.255 — ABNORMAL LOW (ref 7.350–7.450)

## 2015-09-28 LAB — URINE DRUG SCREEN, QUALITATIVE (ARMC ONLY)
Amphetamines, Ur Screen: NOT DETECTED
BARBITURATES, UR SCREEN: NOT DETECTED
BENZODIAZEPINE, UR SCRN: NOT DETECTED
CANNABINOID 50 NG, UR ~~LOC~~: NOT DETECTED
Cocaine Metabolite,Ur ~~LOC~~: POSITIVE — AB
MDMA (Ecstasy)Ur Screen: NOT DETECTED
METHADONE SCREEN, URINE: NOT DETECTED
Opiate, Ur Screen: NOT DETECTED
Phencyclidine (PCP) Ur S: NOT DETECTED
TRICYCLIC, UR SCREEN: POSITIVE — AB

## 2015-09-28 LAB — BLOOD GAS, ARTERIAL
ACID-BASE DEFICIT: 2.2 mmol/L — AB (ref 0.0–2.0)
Bicarbonate: 22.6 mEq/L (ref 20.0–24.0)
DRAWN BY: 441371
FIO2: 1
O2 SAT: 99.4 %
PATIENT TEMPERATURE: 97.2
PCO2 ART: 40.1 mmHg (ref 35.0–45.0)
PEEP: 5 cmH2O
PH ART: 7.364 (ref 7.350–7.450)
PO2 ART: 304 mmHg — AB (ref 80.0–100.0)
RATE: 20 resp/min
TCO2: 23.8 mmol/L (ref 0–100)
VT: 540 mL

## 2015-09-28 LAB — URINALYSIS COMPLETE WITH MICROSCOPIC (ARMC ONLY)
BILIRUBIN URINE: NEGATIVE
GLUCOSE, UA: NEGATIVE mg/dL
HGB URINE DIPSTICK: NEGATIVE
KETONES UR: NEGATIVE mg/dL
LEUKOCYTES UA: NEGATIVE
NITRITE: NEGATIVE
PH: 7 (ref 5.0–8.0)
Protein, ur: 30 mg/dL — AB
Specific Gravity, Urine: 1.01 (ref 1.005–1.030)

## 2015-09-28 LAB — COMPREHENSIVE METABOLIC PANEL
ALBUMIN: 4.1 g/dL (ref 3.5–5.0)
ALK PHOS: 94 U/L (ref 38–126)
ALT: 16 U/L (ref 14–54)
ANION GAP: 6 (ref 5–15)
AST: 26 U/L (ref 15–41)
BILIRUBIN TOTAL: 0.4 mg/dL (ref 0.3–1.2)
BUN: 26 mg/dL — ABNORMAL HIGH (ref 6–20)
CALCIUM: 9.1 mg/dL (ref 8.9–10.3)
CO2: 26 mmol/L (ref 22–32)
CREATININE: 1.55 mg/dL — AB (ref 0.44–1.00)
Chloride: 110 mmol/L (ref 101–111)
GFR calc non Af Amer: 35 mL/min — ABNORMAL LOW (ref >60)
GFR, EST AFRICAN AMERICAN: 41 mL/min — AB (ref >60)
GLUCOSE: 157 mg/dL — AB (ref 65–99)
Potassium: 4.4 mmol/L (ref 3.5–5.1)
Sodium: 142 mmol/L (ref 135–145)
TOTAL PROTEIN: 7.6 g/dL (ref 6.5–8.1)

## 2015-09-28 LAB — CBC WITH DIFFERENTIAL/PLATELET
BAND NEUTROPHILS: 0 %
BASOS ABS: 0 10*3/uL (ref 0–0.1)
BLASTS: 0 %
Basophils Relative: 0 %
EOS ABS: 0.4 10*3/uL (ref 0–0.7)
Eosinophils Relative: 2 %
HCT: 35.8 % (ref 35.0–47.0)
HEMOGLOBIN: 12 g/dL (ref 12.0–16.0)
LYMPHS PCT: 81 %
Lymphs Abs: 15.1 10*3/uL — ABNORMAL HIGH (ref 1.0–3.6)
MCH: 31 pg (ref 26.0–34.0)
MCHC: 33.5 g/dL (ref 32.0–36.0)
MCV: 92.5 fL (ref 80.0–100.0)
MYELOCYTES: 0 %
Metamyelocytes Relative: 0 %
Monocytes Absolute: 0.6 10*3/uL (ref 0.2–0.9)
Monocytes Relative: 3 %
Neutro Abs: 2.6 10*3/uL (ref 1.4–6.5)
Neutrophils Relative %: 14 %
OTHER: 0 %
PLATELETS: 276 10*3/uL (ref 150–440)
PROMYELOCYTES ABS: 0 %
RBC: 3.88 MIL/uL (ref 3.80–5.20)
RDW: 14.4 % (ref 11.5–14.5)
WBC: 18.7 10*3/uL — ABNORMAL HIGH (ref 3.6–11.0)
nRBC: 0 /100 WBC

## 2015-09-28 LAB — LIPID PANEL
CHOL/HDL RATIO: 4.5 ratio
Cholesterol: 170 mg/dL (ref 0–200)
HDL: 38 mg/dL — ABNORMAL LOW (ref >40)
LDL CALC: UNDETERMINED mg/dL (ref 0–99)
Triglycerides: 497 mg/dL — ABNORMAL HIGH (ref <150)
VLDL: UNDETERMINED mg/dL (ref 0–40)

## 2015-09-28 LAB — PROCALCITONIN: Procalcitonin: <0.1 ng/mL

## 2015-09-28 LAB — CBC
HCT: 39 % (ref 36.0–46.0)
HEMOGLOBIN: 13 g/dL (ref 12.0–15.0)
MCH: 31 pg (ref 26.0–34.0)
MCHC: 33.3 g/dL (ref 30.0–36.0)
MCV: 92.9 fL (ref 78.0–100.0)
PLATELETS: UNDETERMINED 10*3/uL (ref 150–400)
RBC: 4.2 MIL/uL (ref 3.87–5.11)
RDW: 14.2 % (ref 11.5–15.5)
WBC: 8.6 10*3/uL (ref 4.0–10.5)

## 2015-09-28 LAB — GLUCOSE, CAPILLARY
GLUCOSE-CAPILLARY: 227 mg/dL — AB (ref 65–99)
GLUCOSE-CAPILLARY: 242 mg/dL — AB (ref 65–99)
Glucose-Capillary: 180 mg/dL — ABNORMAL HIGH (ref 65–99)

## 2015-09-28 LAB — PROTIME-INR
INR: 0.98
PROTHROMBIN TIME: 13.2 s (ref 11.4–15.0)

## 2015-09-28 LAB — MRSA PCR SCREENING: MRSA BY PCR: NEGATIVE

## 2015-09-28 LAB — TROPONIN I

## 2015-09-28 LAB — LACTIC ACID, PLASMA: LACTIC ACID, VENOUS: 1.5 mmol/L (ref 0.5–2.0)

## 2015-09-28 LAB — ETHANOL: Alcohol, Ethyl (B): <5 mg/dL (ref <5)

## 2015-09-28 LAB — APTT: aPTT: 27 seconds (ref 24–36)

## 2015-09-28 LAB — TRIGLYCERIDES: Triglycerides: 503 mg/dL — ABNORMAL HIGH (ref <150)

## 2015-09-28 SURGERY — RADIOLOGY WITH ANESTHESIA
Anesthesia: General

## 2015-09-28 MED ORDER — LORAZEPAM 2 MG/ML IJ SOLN
INTRAMUSCULAR | Status: AC
Start: 2015-09-28 — End: 2015-09-28
  Administered 2015-09-28: 2 mg
  Filled 2015-09-28: qty 1

## 2015-09-28 MED ORDER — PROPOFOL 1000 MG/100ML IV EMUL
5.0000 ug/kg/min | INTRAVENOUS | Status: DC
  Administered 2015-09-28: 50 ug/kg/min via INTRAVENOUS
  Administered 2015-09-28: 60 ug/kg/min via INTRAVENOUS
  Filled 2015-09-28: qty 100

## 2015-09-28 MED ORDER — NITROGLYCERIN 1 MG/10 ML FOR IR/CATH LAB
INTRA_ARTERIAL | Status: AC
Start: 2015-09-28 — End: 2015-09-28
  Filled 2015-09-28: qty 10

## 2015-09-28 MED ORDER — INSULIN ASPART 100 UNIT/ML ~~LOC~~ SOLN
0.0000 [IU] | SUBCUTANEOUS | Status: DC
  Administered 2015-09-28: 2 [IU] via SUBCUTANEOUS
  Administered 2015-09-28: 3 [IU] via SUBCUTANEOUS
  Administered 2015-09-28: 2 [IU] via SUBCUTANEOUS
  Administered 2015-09-28: 3 [IU] via SUBCUTANEOUS
  Administered 2015-09-29: 1 [IU] via SUBCUTANEOUS
  Administered 2015-09-29 – 2015-09-30 (×7): 2 [IU] via SUBCUTANEOUS
  Administered 2015-10-01: 3 [IU] via SUBCUTANEOUS
  Administered 2015-10-01: 5 [IU] via SUBCUTANEOUS
  Administered 2015-10-01: 2 [IU] via SUBCUTANEOUS
  Administered 2015-10-01 – 2015-10-02 (×5): 3 [IU] via SUBCUTANEOUS
  Administered 2015-10-02 (×3): 5 [IU] via SUBCUTANEOUS
  Administered 2015-10-03 (×3): 3 [IU] via SUBCUTANEOUS
  Administered 2015-10-03: 5 [IU] via SUBCUTANEOUS
  Administered 2015-10-03: 2 [IU] via SUBCUTANEOUS
  Administered 2015-10-03 – 2015-10-04 (×4): 3 [IU] via SUBCUTANEOUS
  Administered 2015-10-04: 1 [IU] via SUBCUTANEOUS
  Administered 2015-10-04 (×2): 3 [IU] via SUBCUTANEOUS
  Administered 2015-10-05: 2 [IU] via SUBCUTANEOUS
  Administered 2015-10-05: 3 [IU] via SUBCUTANEOUS
  Administered 2015-10-05: 2 [IU] via SUBCUTANEOUS

## 2015-09-28 MED ORDER — NICARDIPINE HCL IN NACL 20-0.86 MG/200ML-% IV SOLN
INTRAVENOUS | Status: AC
  Filled 2015-09-28: qty 200

## 2015-09-28 MED ORDER — PROPOFOL 1000 MG/100ML IV EMUL
INTRAVENOUS | Status: AC
  Filled 2015-09-28: qty 100

## 2015-09-28 MED ORDER — LORAZEPAM 2 MG/ML IJ SOLN
1.0000 mg | Freq: Once | INTRAMUSCULAR | Status: AC
  Administered 2015-09-28: 1 mg via INTRAVENOUS
  Filled 2015-09-28: qty 1

## 2015-09-28 MED ORDER — SODIUM CHLORIDE 0.9 % IV SOLN
100.0000 ug/h | INTRAVENOUS | Status: DC
  Administered 2015-09-28: 25 ug/h via INTRAVENOUS
  Filled 2015-09-28: qty 50

## 2015-09-28 MED ORDER — NICARDIPINE HCL IN NACL 20-0.86 MG/200ML-% IV SOLN: 3.0000 mg/h | INTRAVENOUS | Status: DC

## 2015-09-28 MED ORDER — DILTIAZEM HCL 100 MG IV SOLR
5.0000 mg/h | Freq: Once | INTRAVENOUS | Status: AC
  Administered 2015-09-28: 5 mg/h via INTRAVENOUS
  Filled 2015-09-28: qty 100

## 2015-09-28 MED ORDER — SODIUM CHLORIDE 0.9 % IV SOLN
INTRAVENOUS | Status: DC
  Administered 2015-09-29: 17:00:00 via INTRAVENOUS

## 2015-09-28 MED ORDER — NICARDIPINE HCL IN NACL 20-0.86 MG/200ML-% IV SOLN: 5.0000 mg/h | INTRAVENOUS | Status: DC

## 2015-09-28 MED ORDER — ONDANSETRON HCL 4 MG/2ML IJ SOLN
4.0000 mg | Freq: Four times a day (QID) | INTRAMUSCULAR | Status: DC | PRN
  Administered 2015-10-10 – 2015-10-18 (×3): 4 mg via INTRAVENOUS
  Filled 2015-09-28 (×3): qty 2

## 2015-09-28 MED ORDER — ROCURONIUM BROMIDE 100 MG/10ML IV SOLN
INTRAVENOUS | Status: DC | PRN
  Administered 2015-09-28: 50 mg via INTRAVENOUS
  Administered 2015-09-28 (×2): 20 mg via INTRAVENOUS

## 2015-09-28 MED ORDER — SODIUM CHLORIDE 0.9 % IV SOLN
0.0000 mg/h | INTRAVENOUS | Status: DC
  Administered 2015-09-28: 7 mg/h via INTRAVENOUS
  Administered 2015-09-28: 2 mg/h via INTRAVENOUS
  Administered 2015-09-29: 1 mg/h via INTRAVENOUS
  Administered 2015-09-29: 6 mg/h via INTRAVENOUS
  Administered 2015-09-30: 4 mg/h via INTRAVENOUS
  Administered 2015-10-01: 7 mg/h via INTRAVENOUS
  Filled 2015-09-28 (×6): qty 10

## 2015-09-28 MED ORDER — ACETAMINOPHEN 650 MG RE SUPP: 650.0000 mg | RECTAL | Status: DC | PRN

## 2015-09-28 MED ORDER — ACETAMINOPHEN 500 MG PO TABS
1000.0000 mg | ORAL_TABLET | Freq: Four times a day (QID) | ORAL | Status: DC | PRN
  Administered 2015-09-29 – 2015-10-03 (×5): 1000 mg via ORAL
  Filled 2015-09-28 (×5): qty 2

## 2015-09-28 MED ORDER — ACETAMINOPHEN 650 MG RE SUPP
650.0000 mg | Freq: Four times a day (QID) | RECTAL | Status: DC | PRN
  Administered 2015-09-29 – 2015-10-05 (×2): 650 mg via RECTAL
  Filled 2015-09-28 (×2): qty 1

## 2015-09-28 MED ORDER — FENTANYL CITRATE (PF) 250 MCG/5ML IJ SOLN
INTRAMUSCULAR | Status: AC
  Filled 2015-09-28: qty 5

## 2015-09-28 MED ORDER — HYDROMORPHONE HCL 1 MG/ML IJ SOLN: 0.2500 mg | INTRAMUSCULAR | Status: DC | PRN

## 2015-09-28 MED ORDER — STROKE: EARLY STAGES OF RECOVERY BOOK
Freq: Once | Status: DC
  Filled 2015-09-28 (×2): qty 1

## 2015-09-28 MED ORDER — DILTIAZEM HCL 100 MG IV SOLR
100.0000 mg | INTRAVENOUS | Status: DC | PRN
  Administered 2015-09-28: 7.5 mg/h via INTRAVENOUS

## 2015-09-28 MED ORDER — FENTANYL CITRATE (PF) 100 MCG/2ML IJ SOLN
INTRAMUSCULAR | Status: DC | PRN
  Administered 2015-09-28: 100 ug via INTRAVENOUS
  Administered 2015-09-28 (×3): 50 ug via INTRAVENOUS

## 2015-09-28 MED ORDER — ANTISEPTIC ORAL RINSE SOLUTION (CORINZ)
7.0000 mL | OROMUCOSAL | Status: DC
  Administered 2015-09-28 – 2015-10-19 (×201): 7 mL via OROMUCOSAL

## 2015-09-28 MED ORDER — PANTOPRAZOLE SODIUM 40 MG IV SOLR
40.0000 mg | Freq: Every day | INTRAVENOUS | Status: DC
  Administered 2015-09-28 – 2015-10-02 (×5): 40 mg via INTRAVENOUS
  Filled 2015-09-28 (×5): qty 40

## 2015-09-28 MED ORDER — NITROGLYCERIN 1 MG/10 ML FOR IR/CATH LAB
100.0000 ug | Freq: Once | INTRA_ARTERIAL | Status: AC
  Administered 2015-09-28: 25 ug via INTRA_ARTERIAL

## 2015-09-28 MED ORDER — EPTIFIBATIDE 20 MG/10ML IV SOLN
20.0000 mg | Freq: Once | INTRAVENOUS | Status: AC
Start: 2015-09-28 — End: 2015-09-28
  Administered 2015-09-28 (×3): 2 mg via INTRAVENOUS

## 2015-09-28 MED ORDER — SODIUM CHLORIDE 0.9 % IV SOLN
INTRAVENOUS | Status: DC
Start: 2015-09-28 — End: 2015-10-01
  Administered 2015-09-28: 75 mL/h via INTRAVENOUS
  Administered 2015-09-29 – 2015-09-30 (×2): via INTRAVENOUS
  Administered 2015-09-30: 75 mL/h via INTRAVENOUS
  Administered 2015-10-01: 05:00:00 via INTRAVENOUS

## 2015-09-28 MED ORDER — MIDAZOLAM BOLUS VIA INFUSION
1.0000 mg | INTRAVENOUS | Status: DC | PRN
  Filled 2015-09-28: qty 2

## 2015-09-28 MED ORDER — ACETAMINOPHEN 325 MG PO TABS: 650.0000 mg | ORAL_TABLET | ORAL | Status: DC | PRN

## 2015-09-28 MED ORDER — CHLORHEXIDINE GLUCONATE 0.12% ORAL RINSE (MEDLINE KIT)
15.0000 mL | Freq: Two times a day (BID) | OROMUCOSAL | Status: DC
  Administered 2015-09-28 – 2015-10-19 (×42): 15 mL via OROMUCOSAL

## 2015-09-28 MED ORDER — FENTANYL CITRATE (PF) 100 MCG/2ML IJ SOLN
25.0000 ug | Freq: Two times a day (BID) | INTRAMUSCULAR | Status: DC | PRN
  Administered 2015-09-28: 200 ug via INTRAVENOUS
  Administered 2015-09-29: 100 ug via INTRAVENOUS
  Filled 2015-09-28 (×3): qty 2

## 2015-09-28 MED ORDER — IOHEXOL 350 MG/ML SOLN
80.0000 mL | Freq: Once | INTRAVENOUS | Status: AC | PRN
Start: 2015-09-28 — End: 2015-09-28
  Administered 2015-09-28: 80 mL via INTRAVENOUS

## 2015-09-28 MED ORDER — PROMETHAZINE HCL 25 MG/ML IJ SOLN: 6.2500 mg | INTRAMUSCULAR | Status: DC | PRN

## 2015-09-28 MED ORDER — ARTIFICIAL TEARS OP OINT
TOPICAL_OINTMENT | OPHTHALMIC | Status: DC | PRN
  Administered 2015-09-28: 1 via OPHTHALMIC

## 2015-09-28 MED ORDER — NICARDIPINE HCL IN NACL 20-0.86 MG/200ML-% IV SOLN
3.0000 mg/h | INTRAVENOUS | Status: DC
  Administered 2015-09-28: 5 mg/h via INTRAVENOUS

## 2015-09-28 MED ORDER — EPTIFIBATIDE 20 MG/10ML IV SOLN
INTRAVENOUS | Status: AC
  Administered 2015-09-28: 2 mg via INTRAVENOUS
  Filled 2015-09-28: qty 10

## 2015-09-28 MED ORDER — ROCURONIUM BROMIDE 50 MG/5ML IV SOLN
INTRAVENOUS | Status: DC | PRN
  Administered 2015-09-28: 100 mg via INTRAVENOUS

## 2015-09-28 MED ORDER — PHENYLEPHRINE HCL 10 MG/ML IJ SOLN
INTRAMUSCULAR | Status: DC | PRN
  Administered 2015-09-28 (×2): 80 ug via INTRAVENOUS

## 2015-09-28 MED ORDER — NICARDIPINE HCL IN NACL 40-0.83 MG/200ML-% IV SOLN
3.0000 mg/h | INTRAVENOUS | Status: DC
  Administered 2015-09-28 (×2): 7.5 mg/h via INTRAVENOUS
  Administered 2015-09-29: 9 mg/h via INTRAVENOUS
  Filled 2015-09-28 (×5): qty 200

## 2015-09-28 MED ORDER — PROPOFOL 500 MG/50ML IV EMUL
INTRAVENOUS | Status: DC | PRN
  Administered 2015-09-28: 50 ug/kg/min via INTRAVENOUS

## 2015-09-28 MED ORDER — SODIUM CHLORIDE 0.9 % IV SOLN
INTRAVENOUS | Status: DC | PRN
  Administered 2015-09-28: 05:00:00 via INTRAVENOUS

## 2015-09-28 MED ORDER — IOHEXOL 300 MG/ML  SOLN
150.0000 mL | Freq: Once | INTRAMUSCULAR | Status: AC | PRN
  Administered 2015-09-28: 75 mL via INTRAVENOUS

## 2015-09-28 NOTE — Progress Notes (Signed)
OT Cancellation Note  Patient Details Name: Linda [name] MRN: [id number] DOB: 1956/05/01   Cancelled Treatment:    Reason Eval/Treat Not Completed: Patient not medically ready Pt on bedrest. Intubated.  [facility] Mckaylie Vasey, OTR/L  [phone] 09/28/2015 09/28/2015, 10:37 PM

## 2015-09-28 NOTE — ED Provider Notes (Addendum)
CSN: [id number]     Arrival date & time 09/28/15  9629 History  By signing my name below, I, Linda [name], attest that this documentation has been prepared under the direction and in the presence of Linda Crumble, MD . Electronically Signed: Freida [name], Scribe. 09/28/2015. 4:04 AM.    No chief complaint on file.  LEVEL 5 CAVEAT DUE TO ACUITY OF MEDICAL CONDITION  The history is provided by the EMS personnel. No language interpreter was used.     HPI Comments:  Linda [name] is a 60 y.o. female who presents to the Emergency Department for stroke symptoms. Pt was brought in by ambulance as a transfer from [facility].  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 Pt presents tonight with worsening right sided deficits. Unknown last known well.  Was cocaine positive at [facility]   Past Medical History  Diagnosis Date  . Hep C w/o coma, chronic (HCC)   . Cocaine abuse   . Acute renal failure (HCC)   . Hypertension   . Stroke [facility]) 2012    hemorrhagic   No past surgical history on file. No family history on file. Social History  Substance Use Topics  . Smoking status: Unknown If Ever Smoked  . Smokeless tobacco: Not on file  . Alcohol Use: No   OB History    No data available     Review of Systems  Unable to perform ROS: Acuity of condition   Allergies  Review of patient's allergies indicates no known allergies.  Home Medications   Prior to Admission medications   Medication Sig Start Date End Date Taking? Authorizing Provider  amLODipine-benazepril (LOTREL) 10-20 MG per capsule Take 1 capsule by mouth daily.      Historical Provider, MD  clonazePAM (KLONOPIN) 0.5 MG tablet Take 0.5 mg by mouth 2 (two) times daily as needed. For anxiety    Historical Provider, MD  haloperidol (HALDOL) 2 MG tablet Take 2 mg by mouth 2 (two) times daily.      Historical Provider, MD  metoCLOPramide (REGLAN) 10 MG/10ML SOLN Take 10 mg by mouth as needed. For  upset stomach     Historical Provider, MD  multivitamin with minerals (CERTA-VITE) LIQD Take 5 mLs by mouth daily.      Historical Provider, MD  pantoprazole (PROTONIX) 40 MG tablet Take 40 mg by mouth daily.      Historical Provider, MD   There were no vitals taken for this visit. Physical Exam  Constitutional: She appears well-developed and well-nourished. She appears distressed.  HENT:  Head: Normocephalic and atraumatic.  Nose: Nose normal.  Mouth/Throat: Oropharynx is clear and moist. No oropharyngeal exudate.  Eyes: Conjunctivae and EOM are normal. Pupils are equal, round, and reactive to light. No scleral icterus.  Neck: Normal range of motion. Neck supple. No JVD present. No tracheal deviation present. No thyromegaly present.  Cardiovascular: Normal rate, regular rhythm and normal heart sounds.  Exam reveals no gallop and no friction rub.   No murmur heard. Pulmonary/Chest: Effort normal and breath sounds normal. No respiratory distress. She has no wheezes. She exhibits no tenderness.  Abdominal: Soft. Bowel sounds are normal. She exhibits no distension and no mass. There is no tenderness. There is no rebound and no guarding.  Musculoskeletal: Normal range of motion. She exhibits no edema or tenderness.  Lymphadenopathy:    She has no cervical adenopathy.  Neurological:  Non reponsive  Right side neglect  Not  following commands Not protecting airway   Skin: Skin is warm and dry. No rash noted. No erythema. No pallor.  Nursing note and vitals reviewed.   ED Course  Procedures   DIAGNOSTIC STUDIES:  Oxygen Saturation is 100% on room air, normal by my interpretation.    3:53 AM EMERGENT INTUBATION PROCEDURE NOTE PERFROMED BY: Linda Crumble, MD INDICATION: airway compromise  TECHNIQUE: Unable to obtain consent because of altered level of consciousness.  After pre-oxygenating the patient for 5 minutes, a modified rapid-sequence induction was performed using Ativan and  rocuronium with cricoid pressure. Using a Macintosh 3 laryngoscope blade and 7.62mm cuffed endotracheal tube was placed and secured.  Placement was confirmed with by auscultation.  COMPLICATIONS: None. The patient tolerated the procedure well with no complications. POST PROCEDURE CXR: pending confirmed, but tube is high, will push down further  Labs Review Labs Reviewed  I-STAT ARTERIAL BLOOD GAS, ED - Abnormal; Notable for the following:    pH, Arterial 7.255 (*)    pCO2 arterial 60.3 (*)    pO2, Arterial 180.0 (*)    Bicarbonate 26.7 (*)    All other components within normal limits    Imaging Review Ct Angio Head W/cm &/or Wo Cm  09/28/2015  CLINICAL DATA:  Stroke-like symptoms after cocaine use. History of hepatitis-C, renal failure, hypertension, stroke. EXAM: CT ANGIOGRAPHY HEAD AND NECK TECHNIQUE: Multidetector CT imaging of the head and neck was performed using the standard protocol during bolus administration of intravenous contrast. Multiplanar CT image reconstructions and MIPs were obtained to evaluate the vascular anatomy. Carotid stenosis measurements (when applicable) are obtained utilizing NASCET criteria, using the distal internal carotid diameter as the denominator. CONTRAST:  80mL OMNIPAQUE IOHEXOL 350 MG/ML SOLN COMPARISON:  CT head September 28, 2015 at 0124 hours and MRI/MRA head June 01, 2011 FINDINGS: Moderately motion degraded examination. CTA NECK Aortic arch: Normal appearance of the thoracic arch, normal branch pattern. Motion obscures the origins of the arch vessels. Moderate calcific atherosclerosis of the aortic arch. Arch vessels appear patent. Right carotid system: Common carotid artery is widely patent, coursing in a straight line fashion. Normal appearance of the carotid bifurcation without hemodynamically significant stenosis by NASCET criteria. Mild eccentric calcific atherosclerosis. Normal appearance of the included internal carotid artery. Left carotid system:  Contrast reflux into LEFT internal jugular vein limits evaluation of the LEFT Common carotid artery due to streak artifact. Common carotid artery is widely patent, coursing in a straight line fashion. Normal appearance of the carotid bifurcation without hemodynamically significant stenosis by NASCET criteria. Normal appearance of the included internal carotid artery. Vertebral arteries:Origins of the vertebral arteries obscured by patient motion. Normal appearance of the vertebral arteries, which appear widely patent. Skeleton: No acute osseous process though bone windows have not been submitted. Multiple absent teeth. Other neck: Soft tissues of the neck are nonacute though, not tailored for evaluation. 3.5 x 5.7 cm dominant LEFT thyroid nodule mildly displacing and narrowing the trachea to the RIGHT. CTA HEAD Anterior circulation: Normal appearance of the cervical internal carotid arteries, petrous, cavernous and supra clinoid internal carotid arteries. Moderate calcific atherosclerosis the carotid siphons. Widely patent anterior communicating artery. LEFT M2 occlusion, density within LEFT sylvian fissure on today's examination likely represent is thromboembolism. Normal appearance of the anterior cerebral arteries. Posterior circulation: Normal appearance of the vertebral arteries, vertebrobasilar junction and basilar artery, as well as main branch vessels. Robust bilateral posterior communicating arteries. Normal appearance of the posterior cerebral arteries. No hemodynamically significant stenosis, dissection,  contrast extravasation or aneurysm within the anterior nor posterior circulation. Mild general luminal irregularity of the intracranial vessels. IMPRESSION: CTA NECK: Moderately motion degraded examination, limited by LEFT venous contamination. No definite acute vascular process or hemodynamically significant stenosis. 3.5 x 5.7 cm dominant LEFT thyroid not oral for which follow up thyroid sonogram is  recommended on a nonemergent basis. CTA HEAD: LEFT M2 segment occlusion corresponding to sylvian fissure density on today's CT, likely acute. Complete circle of Willis.  Mild intracranial atherosclerosis. Acute findings discussed with and reconfirmed by Dr.CHARLES STEWART on 09/28/2015 at 4:05 am. Electronically Signed   By: Awilda Metro M.D.   On: 09/28/2015 04:08   Ct Head Wo Contrast  09/28/2015  CLINICAL DATA:  Right facial droop.  Code stroke. EXAM: CT HEAD WITHOUT CONTRAST TECHNIQUE: Contiguous axial images were obtained from the base of the skull through the vertex without intravenous contrast. COMPARISON:  10/27/2014 FINDINGS: Skull and Sinuses:Negative for fracture or destructive process. No sinusitis or mastoiditis Visualized orbits: Nonspecific dysconjugate gaze. Brain: Extensive chronic small vessel disease with ischemic gliosis throughout the deep cerebral white matter and chronic (based on prior) lacunar infarcts in the bilateral thalamus and deep white matter tracts. Chronic patchy ischemic change also seen in the pons, greater on the right. There is new calcific density in the mid left sylvian fissure, in line with a MCA branch, possible calcific embolus. No evidence of cortical infarct. Aspects is 10. No hemorrhage, hydrocephalus, or shift. Critical Value/emergent results were called by telephone at the time of interpretation on 09/28/2015 at 1:42 am to Dr. Loleta Rose , who verbally acknowledged these results. IMPRESSION: 1. New calcific density in the left sylvian fissure, possible calcific distal M2 embolus. No visible acute infarct or hemorrhage. 2. Advanced chronic small vessel disease with multiple lacunar infarcts. Electronically Signed   By: Marnee Spring M.D.   On: 09/28/2015 01:49   Ct Angio Neck W/cm &/or Wo/cm  09/28/2015  CLINICAL DATA:  Stroke-like symptoms after cocaine use. History of hepatitis-C, renal failure, hypertension, stroke. EXAM: CT ANGIOGRAPHY HEAD AND NECK  TECHNIQUE: Multidetector CT imaging of the head and neck was performed using the standard protocol during bolus administration of intravenous contrast. Multiplanar CT image reconstructions and MIPs were obtained to evaluate the vascular anatomy. Carotid stenosis measurements (when applicable) are obtained utilizing NASCET criteria, using the distal internal carotid diameter as the denominator. CONTRAST:  80mL OMNIPAQUE IOHEXOL 350 MG/ML SOLN COMPARISON:  CT head September 28, 2015 at 0124 hours and MRI/MRA head June 01, 2011 FINDINGS: Moderately motion degraded examination. CTA NECK Aortic arch: Normal appearance of the thoracic arch, normal branch pattern. Motion obscures the origins of the arch vessels. Moderate calcific atherosclerosis of the aortic arch. Arch vessels appear patent. Right carotid system: Common carotid artery is widely patent, coursing in a straight line fashion. Normal appearance of the carotid bifurcation without hemodynamically significant stenosis by NASCET criteria. Mild eccentric calcific atherosclerosis. Normal appearance of the included internal carotid artery. Left carotid system: Contrast reflux into LEFT internal jugular vein limits evaluation of the LEFT Common carotid artery due to streak artifact. Common carotid artery is widely patent, coursing in a straight line fashion. Normal appearance of the carotid bifurcation without hemodynamically significant stenosis by NASCET criteria. Normal appearance of the included internal carotid artery. Vertebral arteries:Origins of the vertebral arteries obscured by patient motion. Normal appearance of the vertebral arteries, which appear widely patent. Skeleton: No acute osseous process though bone windows have not been submitted. Multiple absent  teeth. Other neck: Soft tissues of the neck are nonacute though, not tailored for evaluation. 3.5 x 5.7 cm dominant LEFT thyroid nodule mildly displacing and narrowing the trachea to the RIGHT. CTA HEAD  Anterior circulation: Normal appearance of the cervical internal carotid arteries, petrous, cavernous and supra clinoid internal carotid arteries. Moderate calcific atherosclerosis the carotid siphons. Widely patent anterior communicating artery. LEFT M2 occlusion, density within LEFT sylvian fissure on today's examination likely represent is thromboembolism. Normal appearance of the anterior cerebral arteries. Posterior circulation: Normal appearance of the vertebral arteries, vertebrobasilar junction and basilar artery, as well as main branch vessels. Robust bilateral posterior communicating arteries. Normal appearance of the posterior cerebral arteries. No hemodynamically significant stenosis, dissection, contrast extravasation or aneurysm within the anterior nor posterior circulation. Mild general luminal irregularity of the intracranial vessels. IMPRESSION: CTA NECK: Moderately motion degraded examination, limited by LEFT venous contamination. No definite acute vascular process or hemodynamically significant stenosis. 3.5 x 5.7 cm dominant LEFT thyroid not oral for which follow up thyroid sonogram is recommended on a nonemergent basis. CTA HEAD: LEFT M2 segment occlusion corresponding to sylvian fissure density on today's CT, likely acute. Complete circle of Willis.  Mild intracranial atherosclerosis. Acute findings discussed with and reconfirmed by Dr.CHARLES STEWART on 09/28/2015 at 4:05 am. Electronically Signed   By: Awilda Metro M.D.   On: 09/28/2015 04:08   Dg Chest Portable 1 View  09/28/2015  CLINICAL DATA:  Intubation EXAM: PORTABLE CHEST 1 VIEW COMPARISON:  10/27/2014 FINDINGS: High endotracheal tube with tip at the thoracic inlet and near the glottis. Low lung volumes with diffuse increase in interstitial opacity. Mild cardiomegaly. Aortic tortuosity accentuated by volumes and rotation. These results were called by telephone at the time of interpretation on 09/28/2015 at 4:18 am to RN Shanda Bumps, who  verbally acknowledged these results. IMPRESSION: 1. High endotracheal tube with balloon near the glottis. Recommend advancement by 3 to 4 cm. 2. Diffuse increase in lung opacity which may be from low volumes and atelectasis or aspiration. Electronically Signed   By: Marnee Spring M.D.   On: 09/28/2015 04:20   I have personally reviewed and evaluated these images and lab results as part of my medical decision-making.   EKG Interpretation None      MDM   Final diagnoses:  None    Patient presents to the ED for an acute stroke.  CT perf was obtained showing MCA infarct.  She was immediately intubated for airway protection.  Dr. Roseanne Reno has evaluated the patient and will admit for possible IR retrieval.  Patient is now hypertensive and tachycardic.  Fentanyl drip was started, will add on diltiazem.  BP now improved 175/97.  Patient has CO2 retention, increased RR from 16 to 20.  Patient remains in critical condition.  CRITICAL CARE Performed by: Linda Crumble, MD Total critical care time: 45 minutes - ischemic stroke requiring IR Critical care time was exclusive of separately billable procedures and treating other patients. Critical care was necessary to treat or prevent imminent or life-threatening deterioration. Critical care was time spent personally by me on the following activities: development of treatment plan with patient and/or surrogate as well as nursing, discussions with consultants, evaluation of patient's response to treatment, examination of patient, obtaining history from patient or surrogate, ordering and performing treatments and interventions, ordering and review of laboratory studies, ordering and review of radiographic studies, pulse oximetry and re-evaluation of patient's condition.  I personally performed the services described in this documentation, which was scribed  in my presence. The recorded information has been reviewed and is accurate.      Linda Crumble,  MD 09/28/15 [phone]  OF NOTE:  PAient was a difficult intubation due to h/o previous tracheostomy (I assume with neck scar).  Cords were easily visualized however difficult to past an ETT through them.  Linda Crumble, MD 09/28/15 1535

## 2015-09-28 NOTE — ED Notes (Signed)
Pt vomited,  zofran given

## 2015-09-28 NOTE — Anesthesia Preprocedure Evaluation (Addendum)
Anesthesia Evaluation  Patient identified by MRN, date of birth, ID band Patient unresponsive    Reviewed: Allergy & Precautions, NPO status , Patient's Chart, lab work & pertinent test results, Unable to perform ROS - Chart review only  Airway Mallampati: Intubated  TM Distance: >3 FB Neck ROM: Full    Dental no notable dental hx.    Pulmonary neg pulmonary ROS,    Pulmonary exam normal breath sounds clear to auscultation       Cardiovascular hypertension, Pt. on medications Normal cardiovascular exam Rhythm:Regular Rate:Normal     Neuro/Psych CVA negative psych ROS   GI/Hepatic negative GI ROS, (+)     substance abuse  cocaine use, Hepatitis -, C  Endo/Other  negative endocrine ROS  Renal/GU negative Renal ROS  negative genitourinary   Musculoskeletal negative musculoskeletal ROS (+)   Abdominal   Peds negative pediatric ROS (+)  Hematology negative hematology ROS (+)   Anesthesia Other Findings   Reproductive/Obstetrics negative OB ROS                            Anesthesia Physical Anesthesia Plan  ASA: IV and emergent  Anesthesia Plan: General   Post-op Pain Management:    Induction: Inhalational  Airway Management Planned: Oral ETT  Additional Equipment: Arterial line  Intra-op Plan:   Post-operative Plan: Post-operative intubation/ventilation  Informed Consent: I have reviewed the patients History and Physical, chart, labs and discussed the procedure including the risks, benefits and alternatives for the proposed anesthesia with the patient or authorized representative who has indicated his/her understanding and acceptance.   Dental advisory given  Plan Discussed with: CRNA and Surgeon  Anesthesia Plan Comments:         Anesthesia Quick Evaluation

## 2015-09-28 NOTE — Anesthesia Postprocedure Evaluation (Signed)
Anesthesia Post Note  Patient: Linda [name]  Procedure(s) Performed: Procedure(s) (LRB): RADIOLOGY WITH ANESTHESIA (N/A)  Patient location during evaluation: PACU Anesthesia Type: General Level of consciousness: sedated Pain management: pain level controlled Vital Signs Assessment: post-procedure vital signs reviewed and stable Respiratory status: patient remains intubated per anesthesia plan Cardiovascular status: stable Postop Assessment: no signs of nausea or vomiting Anesthetic complications: no    Last Vitals:  Filed Vitals:   09/28/15 0445 09/28/15 0500  BP: 186/103 175/97  Pulse: 138 132  Resp: 20 19    Last Pain: There were no vitals filed for this visit.               Jannet Calip S

## 2015-09-28 NOTE — ED Notes (Addendum)
Pt arrived via EMS at 0105. Initial encode was seizures, then EMS encoded that pt was unresponsive. Upon arrival, EMS stated that friend told EMS pt was falling off couch. Pt arrived awake starting to L side, chewing, moving left side. Pt threw up after a few minutes. Pt had R sided facial droop. Code stroke called at 0107. Pt has hx of stroke. Pt temp 98.8, HR 106, BP 162/135, 02 92% on RA, RR 18.

## 2015-09-28 NOTE — H&P (Signed)
Admission H&P    Chief Complaint: Altered mental status with her snoring of right-sided weakness.  HPI: Linda [name] is an 60 y.o. female with a history of intracerebral hemorrhage in 2012, hypertension, cocaine abuse, renal failure and hepatitis C, seen in the ED at [facility] after becoming unresponsive with leftward gaze and right facial droop area there was also concern for possible seizure activity initially. She was reportedly last known well at midnight. CT scan of her head showed no acute intracranial abnormality except for small increased density posterior left sylvian fissure. Patient was not a TPA candidate because of a history of intracerebral hemorrhage in 2012. She was transferred to [facility] for further management, including possible interventional radiology procedures. CT angiogram of left M2 segment occlusion responding to the high density abnormality seen in sylvian fissure on CT scan. Study was otherwise unremarkable. Patient was intubated in the ED at [facility] for airway protection. Urine drug screen was positive for cocaine. A pressure was elevated at 210/127. Cardene drip IV was started.   LSN: 12:00 AM on 09/28/2015 tPA Given: No: History of ICH in 2012 mRankin:  Past Medical History  Diagnosis Date  . Hep C w/o coma, chronic ([place])   . Cocaine abuse   . Acute renal failure ([place])   . Hypertension   . Stroke [facility]) 2012    hemorrhagic    History reviewed. No pertinent past surgical history.  Family history: Unavailable due to patient's mental status changes.  Social History:  reports that she uses illicit drugs (Cocaine). She reports that she does not drink alcohol. Her tobacco history is not on file.  Allergies: No Known Allergies  Medications: Patient's preadmission medications were reviewed by me.  ROS: Unavailable due to patient's mental status changes  Physical Examination: Pulse 138, resp. rate 17, SpO2 98 %.  HEENT-  Normocephalic, no lesions, without obvious  abnormality.  Normal external eye and conjunctiva.  Normal TM's bilaterally.  Normal auditory canals and external ears. Normal external nose, mucus membranes and septum.  Normal pharynx. Neck supple with no masses, nodes, nodules or enlargement. Cardiovascular - tachycardic, normal S1 and S2, no murmur or gallop Lungs - chest clear, no wheezing, rales, normal symmetric air entry Abdomen - soft, non-tender; bowel sounds normal; no masses,  no organomegaly Extremities - no joint deformities, effusion, or inflammation and no edema  Neurologic Examination: Patient was stuporous and had no verbal output. She had gaze preference to the left side. At times she appeared to all simple commands with use of left upper extremity. Continuous chewing motions were noted, which became less prominent after receiving 2 mg of Ativan IV. Pupils were equal and reacted normally to light. She was not alert enough for testing of visual fields. Extraocular movements were intact and full on lateral gaze with oculocephalic maneuvers. Moderate right facial weakness was noted. Patient moved left extremities continuously, but occasionally moved right lower extremity spontaneously. It was also facial flexion posturing of right lower extremity. Deep tendon reflexes were 2+ and brisk were symmetrical. Plantar responses are extensor bilaterally. Carotid auscultation was normal.  Results for orders placed or performed during the hospital encounter of 09/28/15 (from the past 48 hour(s))  Ethanol     Status: None   Collection Time: 09/28/15  1:12 AM  Result Value Ref Range   Alcohol, Ethyl (B) <5 <5 mg/dL    Comment:        LOWEST DETECTABLE LIMIT FOR SERUM ALCOHOL IS 5 mg/dL FOR MEDICAL PURPOSES ONLY  APTT     Status: None   Collection Time: 09/28/15  1:12 AM  Result Value Ref Range   aPTT 27 24 - 36 seconds  Comprehensive metabolic panel     Status: Abnormal   Collection Time: 09/28/15  1:12 AM  Result Value Ref  Range   Sodium 142 135 - 145 mmol/L   Potassium 4.4 3.5 - 5.1 mmol/L   Chloride 110 101 - 111 mmol/L   CO2 26 22 - 32 mmol/L   Glucose, Bld 157 (H) 65 - 99 mg/dL   BUN 26 (H) 6 - 20 mg/dL   Creatinine, Ser 1.55 (H) 0.44 - 1.00 mg/dL   Calcium 9.1 8.9 - 10.3 mg/dL   Total Protein 7.6 6.5 - 8.1 g/dL   Albumin 4.1 3.5 - 5.0 g/dL   AST 26 15 - 41 U/L   ALT 16 14 - 54 U/L   Alkaline Phosphatase 94 38 - 126 U/L   Total Bilirubin 0.4 0.3 - 1.2 mg/dL   GFR calc non Af Amer 35 (L) >60 mL/min   GFR calc Af Amer 41 (L) >60 mL/min    Comment: (NOTE) The eGFR has been calculated using the CKD EPI equation. This calculation has not been validated in all clinical situations. eGFR's persistently <60 mL/min signify possible Chronic Kidney Disease.    Anion gap 6 5 - 15  Troponin I     Status: None   Collection Time: 09/28/15  1:12 AM  Result Value Ref Range   Troponin I <0.03 <0.031 ng/mL    Comment:        NO INDICATION OF MYOCARDIAL INJURY.   Protime-INR     Status: None   Collection Time: 09/28/15  1:12 AM  Result Value Ref Range   Prothrombin Time 13.2 11.4 - 15.0 seconds   INR 0.98   Urine Drug Screen, Qualitative ([facility] only)     Status: Abnormal   Collection Time: 09/28/15  2:26 AM  Result Value Ref Range   Tricyclic, Ur Screen POSITIVE (A) NONE DETECTED   Amphetamines, Ur Screen NONE DETECTED NONE DETECTED   MDMA (Ecstasy)Ur Screen NONE DETECTED NONE DETECTED   Cocaine Metabolite,Ur Comern­o POSITIVE (A) NONE DETECTED   Opiate, Ur Screen NONE DETECTED NONE DETECTED   Phencyclidine (PCP) Ur S NONE DETECTED NONE DETECTED   Cannabinoid 50 Ng, Ur [place] NONE DETECTED NONE DETECTED   Barbiturates, Ur Screen NONE DETECTED NONE DETECTED   Benzodiazepine, Ur Scrn NONE DETECTED NONE DETECTED   Methadone Scn, Ur NONE DETECTED NONE DETECTED    Comment: (NOTE) 630  Tricyclics, urine               Cutoff 1000 ng/mL 200  Amphetamines, urine             Cutoff 1000 ng/mL 300  MDMA (Ecstasy), urine            Cutoff 500 ng/mL 400  Cocaine Metabolite, urine       Cutoff 300 ng/mL 500  Opiate, urine                   Cutoff 300 ng/mL 600  Phencyclidine (PCP), urine      Cutoff 25 ng/mL 700  Cannabinoid, urine              Cutoff 50 ng/mL 800  Barbiturates, urine             Cutoff 200 ng/mL 900  Benzodiazepine, urine  Cutoff 200 ng/mL 1000 Methadone, urine                Cutoff 300 ng/mL 1100 1200 The urine drug screen provides only a preliminary, unconfirmed 1300 analytical test result and should not be used for non-medical 1400 purposes. Clinical consideration and professional judgment should 1500 be applied to any positive drug screen result due to possible 1600 interfering substances. A more specific alternate chemical method 1700 must be used in order to obtain a confirmed analytical result.  1800 Gas chromato graphy / mass spectrometry (GC/MS) is the preferred 1900 confirmatory method.   Urinalysis complete, with microscopic ([facility] only)     Status: Abnormal   Collection Time: 09/28/15  2:26 AM  Result Value Ref Range   Color, Urine YELLOW (A) YELLOW   APPearance CLEAR (A) CLEAR   Glucose, UA NEGATIVE NEGATIVE mg/dL   Bilirubin Urine NEGATIVE NEGATIVE   Ketones, ur NEGATIVE NEGATIVE mg/dL   Specific Gravity, Urine 1.010 1.005 - 1.030   Hgb urine dipstick NEGATIVE NEGATIVE   pH 7.0 5.0 - 8.0   Protein, ur 30 (A) NEGATIVE mg/dL   Nitrite NEGATIVE NEGATIVE   Leukocytes, UA NEGATIVE NEGATIVE   RBC / HPF 0-5 0 - 5 RBC/hpf   WBC, UA 0-5 0 - 5 WBC/hpf   Bacteria, UA RARE (A) NONE SEEN   Squamous Epithelial / LPF 0-5 (A) NONE SEEN   Ct Angio Head W/cm &/or Wo Cm  09/28/2015  CLINICAL DATA:  Stroke-like symptoms after cocaine use. History of hepatitis-C, renal failure, hypertension, stroke. EXAM: CT ANGIOGRAPHY HEAD AND NECK TECHNIQUE: Multidetector CT imaging of the head and neck was performed using the standard protocol during bolus administration of intravenous  contrast. Multiplanar CT image reconstructions and MIPs were obtained to evaluate the vascular anatomy. Carotid stenosis measurements (when applicable) are obtained utilizing NASCET criteria, using the distal internal carotid diameter as the denominator. CONTRAST:  67m OMNIPAQUE IOHEXOL 350 MG/ML SOLN COMPARISON:  CT head September 28, 2015 at 0124 hours and MRI/MRA head June 01, 2011 FINDINGS: Moderately motion degraded examination. CTA NECK Aortic arch: Normal appearance of the thoracic arch, normal branch pattern. Motion obscures the origins of the arch vessels. Moderate calcific atherosclerosis of the aortic arch. Arch vessels appear patent. Right carotid system: Common carotid artery is widely patent, coursing in a straight line fashion. Normal appearance of the carotid bifurcation without hemodynamically significant stenosis by NASCET criteria. Mild eccentric calcific atherosclerosis. Normal appearance of the included internal carotid artery. Left carotid system: Contrast reflux into LEFT internal jugular vein limits evaluation of the LEFT Common carotid artery due to streak artifact. Common carotid artery is widely patent, coursing in a straight line fashion. Normal appearance of the carotid bifurcation without hemodynamically significant stenosis by NASCET criteria. Normal appearance of the included internal carotid artery. Vertebral arteries:Origins of the vertebral arteries obscured by patient motion. Normal appearance of the vertebral arteries, which appear widely patent. Skeleton: No acute osseous process though bone windows have not been submitted. Multiple absent teeth. Other neck: Soft tissues of the neck are nonacute though, not tailored for evaluation. 3.5 x 5.7 cm dominant LEFT thyroid nodule mildly displacing and narrowing the trachea to the RIGHT. CTA HEAD Anterior circulation: Normal appearance of the cervical internal carotid arteries, petrous, cavernous and supra clinoid internal carotid  arteries. Moderate calcific atherosclerosis the carotid siphons. Widely patent anterior communicating artery. LEFT M2 occlusion, density within LEFT sylvian fissure on today's examination likely represent is thromboembolism. Normal appearance of  the anterior cerebral arteries. Posterior circulation: Normal appearance of the vertebral arteries, vertebrobasilar junction and basilar artery, as well as main branch vessels. Robust bilateral posterior communicating arteries. Normal appearance of the posterior cerebral arteries. No hemodynamically significant stenosis, dissection, contrast extravasation or aneurysm within the anterior nor posterior circulation. Mild general luminal irregularity of the intracranial vessels. IMPRESSION: CTA NECK: Moderately motion degraded examination, limited by LEFT venous contamination. No definite acute vascular process or hemodynamically significant stenosis. 3.5 x 5.7 cm dominant LEFT thyroid not oral for which follow up thyroid sonogram is recommended on a nonemergent basis. CTA HEAD: LEFT M2 segment occlusion corresponding to sylvian fissure density on today's CT, likely acute. Complete circle of Willis.  Mild intracranial atherosclerosis. Acute findings discussed with and reconfirmed by Dr.Willard Farquharson on 09/28/2015 at 4:05 am. Electronically Signed   By: Elon Alas M.D.   On: 09/28/2015 04:08   Ct Head Wo Contrast  09/28/2015  CLINICAL DATA:  Right facial droop.  Code stroke. EXAM: CT HEAD WITHOUT CONTRAST TECHNIQUE: Contiguous axial images were obtained from the base of the skull through the vertex without intravenous contrast. COMPARISON:  10/27/2014 FINDINGS: Skull and Sinuses:Negative for fracture or destructive process. No sinusitis or mastoiditis Visualized orbits: Nonspecific dysconjugate gaze. Brain: Extensive chronic small vessel disease with ischemic gliosis throughout the deep cerebral white matter and chronic (based on prior) lacunar infarcts in the bilateral  thalamus and deep white matter tracts. Chronic patchy ischemic change also seen in the pons, greater on the right. There is new calcific density in the mid left sylvian fissure, in line with a MCA branch, possible calcific embolus. No evidence of cortical infarct. Aspects is 10. No hemorrhage, hydrocephalus, or shift. Critical Value/emergent results were called by telephone at the time of interpretation on 09/28/2015 at 1:42 am to Dr. Hinda Kehr , who verbally acknowledged these results. IMPRESSION: 1. New calcific density in the left sylvian fissure, possible calcific distal M2 embolus. No visible acute infarct or hemorrhage. 2. Advanced chronic small vessel disease with multiple lacunar infarcts. Electronically Signed   By: Monte Fantasia M.D.   On: 09/28/2015 01:49   Ct Angio Neck W/cm &/or Wo/cm  09/28/2015  CLINICAL DATA:  Stroke-like symptoms after cocaine use. History of hepatitis-C, renal failure, hypertension, stroke. EXAM: CT ANGIOGRAPHY HEAD AND NECK TECHNIQUE: Multidetector CT imaging of the head and neck was performed using the standard protocol during bolus administration of intravenous contrast. Multiplanar CT image reconstructions and MIPs were obtained to evaluate the vascular anatomy. Carotid stenosis measurements (when applicable) are obtained utilizing NASCET criteria, using the distal internal carotid diameter as the denominator. CONTRAST:  33m OMNIPAQUE IOHEXOL 350 MG/ML SOLN COMPARISON:  CT head September 28, 2015 at 0124 hours and MRI/MRA head June 01, 2011 FINDINGS: Moderately motion degraded examination. CTA NECK Aortic arch: Normal appearance of the thoracic arch, normal branch pattern. Motion obscures the origins of the arch vessels. Moderate calcific atherosclerosis of the aortic arch. Arch vessels appear patent. Right carotid system: Common carotid artery is widely patent, coursing in a straight line fashion. Normal appearance of the carotid bifurcation without hemodynamically  significant stenosis by NASCET criteria. Mild eccentric calcific atherosclerosis. Normal appearance of the included internal carotid artery. Left carotid system: Contrast reflux into LEFT internal jugular vein limits evaluation of the LEFT Common carotid artery due to streak artifact. Common carotid artery is widely patent, coursing in a straight line fashion. Normal appearance of the carotid bifurcation without hemodynamically significant stenosis by NASCET criteria. Normal appearance  of the included internal carotid artery. Vertebral arteries:Origins of the vertebral arteries obscured by patient motion. Normal appearance of the vertebral arteries, which appear widely patent. Skeleton: No acute osseous process though bone windows have not been submitted. Multiple absent teeth. Other neck: Soft tissues of the neck are nonacute though, not tailored for evaluation. 3.5 x 5.7 cm dominant LEFT thyroid nodule mildly displacing and narrowing the trachea to the RIGHT. CTA HEAD Anterior circulation: Normal appearance of the cervical internal carotid arteries, petrous, cavernous and supra clinoid internal carotid arteries. Moderate calcific atherosclerosis the carotid siphons. Widely patent anterior communicating artery. LEFT M2 occlusion, density within LEFT sylvian fissure on today's examination likely represent is thromboembolism. Normal appearance of the anterior cerebral arteries. Posterior circulation: Normal appearance of the vertebral arteries, vertebrobasilar junction and basilar artery, as well as main branch vessels. Robust bilateral posterior communicating arteries. Normal appearance of the posterior cerebral arteries. No hemodynamically significant stenosis, dissection, contrast extravasation or aneurysm within the anterior nor posterior circulation. Mild general luminal irregularity of the intracranial vessels. IMPRESSION: CTA NECK: Moderately motion degraded examination, limited by LEFT venous contamination.  No definite acute vascular process or hemodynamically significant stenosis. 3.5 x 5.7 cm dominant LEFT thyroid not oral for which follow up thyroid sonogram is recommended on a nonemergent basis. CTA HEAD: LEFT M2 segment occlusion corresponding to sylvian fissure density on today's CT, likely acute. Complete circle of Willis.  Mild intracranial atherosclerosis. Acute findings discussed with and reconfirmed by Dr.Kylo Gavin on 09/28/2015 at 4:05 am. Electronically Signed   By: Elon Alas M.D.   On: 09/28/2015 04:08   Dg Chest Portable 1 View  09/28/2015  CLINICAL DATA:  Intubation EXAM: PORTABLE CHEST 1 VIEW COMPARISON:  10/27/2014 FINDINGS: High endotracheal tube with tip at the thoracic inlet and near the glottis. Low lung volumes with diffuse increase in interstitial opacity. Mild cardiomegaly. Aortic tortuosity accentuated by volumes and rotation. These results were called by telephone at the time of interpretation on 09/28/2015 at 4:18 am to RN Janett Billow, who verbally acknowledged these results. IMPRESSION: 1. High endotracheal tube with balloon near the glottis. Recommend advancement by 3 to 4 cm. 2. Diffuse increase in lung opacity which may be from low volumes and atelectasis or aspiration. Electronically Signed   By: Monte Fantasia M.D.   On: 09/28/2015 04:20    Assessment: 60 y.o. female with multiple risk factors for stroke, as well as previous intracerebral hemorrhage, presenting with probable acute left MCA territory ischemic stroke with left M2 branch occlusion demonstrated on CT angiogram. Patient's also presenting with hypertensive urgency and possible seizure activity.  Stroke Risk Factors - hypertension and cocaine abuse  Plan: 1. Post interventional radiology admission to neuro intensive care unit 2. MRI  of the brain without contrast 3. PT consult, OT consult, Speech consult 4. Echocardiogram 5. Carotid dopplers 6. Prophylactic therapy-Antiplatelet med: Aspirin  7. EEG  following interventional radiology procedures. 8. HgbA1c, fasting lipid panel  This patient is critically ill and at significant risk of neurological worsening or death, and care requires constant monitoring of vital signs, hemodynamics,respiratory and cardiac monitoring, neurological assessment, discussion with family, other specialists and medical decision making of high complexity. Total critical care time was 90 minutes.  C.R. Nicole Kindred, MD [facility] [phone]  09/28/2015, 4:29 AM

## 2015-09-28 NOTE — Care Management Note (Signed)
Case Management Note  Patient Details  Name: Linda [name] MRN: [id number] Date of Birth: Jun 01, 1956  Subjective/Objective:  Pt admitted on 09/28/15 s/p CVA with seizure.  PTA, pt independent of ADLS.  Per report, pt's sister, Liborio Nixon is POA.                   Action/Plan: Pt currently remains sedated and on ventilator.  Will follow for discharge planning.    Expected Discharge Date:                  Expected Discharge Plan:     In-House Referral:     Discharge planning Services   CM referral  Post Acute Care Choice:    Choice offered to:     DME Arranged:    DME Agency:     HH Arranged:    HH Agency:     Status of Service:   In process, will continue to follow  Medicare Important Message Given:    Date Medicare IM Given:    Medicare IM give by:    Date Additional Medicare IM Given:    Additional Medicare Important Message give by:     If discussed at Long Length of Stay Meetings, dates discussed:    Additional Comments:  Quintella Baton, RN, BSN  Trauma/Neuro ICU Case Manager [phone]

## 2015-09-28 NOTE — Procedures (Signed)
S/P  Lt internal carotid arteriogram followed by complete revascularization of occluded dominant inferior division of LT MCA with x pass with trevoprovue 4 mmx 30 mm retrieval device and 6 mg of superselective INtegrelin achieving  a TICI  3 flow

## 2015-09-28 NOTE — Progress Notes (Signed)
Referring Physician(s): Xu  Chief Complaint:  CVA L MCA clot retrieval 2/2  Subjective:  On vent No response   Allergies: Review of patient's allergies indicates no known allergies.  Medications: Prior to Admission medications   Medication Sig Start Date End Date Taking? Authorizing Provider  acetaminophen (TYLENOL) 500 MG tablet Take 500 mg by mouth every 8 (eight) hours as needed. pain   Yes Historical Provider, MD  albuterol (PROVENTIL HFA;VENTOLIN HFA) 108 (90 Base) MCG/ACT inhaler Inhale 1 puff into the lungs every 6 (six) hours as needed for wheezing or shortness of breath.   Yes Historical Provider, MD  amitriptyline (ELAVIL) 50 MG tablet Take 75 mg by mouth at bedtime.   Yes Historical Provider, MD  amLODipine (NORVASC) 10 MG tablet Take 10 mg by mouth daily.   Yes Historical Provider, MD  atorvastatin (LIPITOR) 40 MG tablet Take 80 mg by mouth daily.   Yes Historical Provider, MD  citalopram (CELEXA) 20 MG tablet Take 20 mg by mouth daily.   Yes Historical Provider, MD  clonazePAM (KLONOPIN) 0.5 MG tablet Take 0.5 mg by mouth 2 (two) times daily as needed. For anxiety   Yes Historical Provider, MD  ergocalciferol (VITAMIN D2) 50000 units capsule Take 50,000 Units by mouth once a week.   Yes Historical Provider, MD  Insulin Glargine (LANTUS SOLOSTAR) 100 UNIT/ML Solostar Pen Inject 60 Units into the skin every evening.   Yes Historical Provider, MD  lisinopril-hydrochlorothiazide (PRINZIDE,ZESTORETIC) 20-12.5 MG tablet Take 1 tablet by mouth daily.   Yes Historical Provider, MD  pantoprazole (PROTONIX) 40 MG tablet Take 40 mg by mouth daily. Reported on 09/28/2015   Yes Historical Provider, MD     Vital Signs: BP 125/76 mmHg  Pulse 106  Temp(Src) 97.2 F (36.2 C) (Axillary)  Resp 32  Ht 5\' 7"  (1.702 m)  Wt 231 lb 11.3 oz (105.1 kg)  BMI 36.28 kg/m2  SpO2 98%  Physical Exam  Skin: Skin is warm.  Rt groin site clean and dry No bleeding  no hematoma Rt foot  2+ pulses  Nursing note and vitals reviewed.   Imaging: Ct Angio Head W/cm &/or Wo Cm  09/28/2015  CLINICAL DATA:  Stroke-like symptoms after cocaine use. History of hepatitis-C, renal failure, hypertension, stroke. EXAM: CT ANGIOGRAPHY HEAD AND NECK TECHNIQUE: Multidetector CT imaging of the head and neck was performed using the standard protocol during bolus administration of intravenous contrast. Multiplanar CT image reconstructions and MIPs were obtained to evaluate the vascular anatomy. Carotid stenosis measurements (when applicable) are obtained utilizing NASCET criteria, using the distal internal carotid diameter as the denominator. CONTRAST:  80mL OMNIPAQUE IOHEXOL 350 MG/ML SOLN COMPARISON:  CT head September 28, 2015 at 0124 hours and MRI/MRA head June 01, 2011 FINDINGS: Moderately motion degraded examination. CTA NECK Aortic arch: Normal appearance of the thoracic arch, normal branch pattern. Motion obscures the origins of the arch vessels. Moderate calcific atherosclerosis of the aortic arch. Arch vessels appear patent. Right carotid system: Common carotid artery is widely patent, coursing in a straight line fashion. Normal appearance of the carotid bifurcation without hemodynamically significant stenosis by NASCET criteria. Mild eccentric calcific atherosclerosis. Normal appearance of the included internal carotid artery. Left carotid system: Contrast reflux into LEFT internal jugular vein limits evaluation of the LEFT Common carotid artery due to streak artifact. Common carotid artery is widely patent, coursing in a straight line fashion. Normal appearance of the carotid bifurcation without hemodynamically significant stenosis by NASCET criteria. Normal appearance  of the included internal carotid artery. Vertebral arteries:Origins of the vertebral arteries obscured by patient motion. Normal appearance of the vertebral arteries, which appear widely patent. Skeleton: No acute osseous process though  bone windows have not been submitted. Multiple absent teeth. Other neck: Soft tissues of the neck are nonacute though, not tailored for evaluation. 3.5 x 5.7 cm dominant LEFT thyroid nodule mildly displacing and narrowing the trachea to the RIGHT. CTA HEAD Anterior circulation: Normal appearance of the cervical internal carotid arteries, petrous, cavernous and supra clinoid internal carotid arteries. Moderate calcific atherosclerosis the carotid siphons. Widely patent anterior communicating artery. LEFT M2 occlusion, density within LEFT sylvian fissure on today's examination likely represent is thromboembolism. Normal appearance of the anterior cerebral arteries. Posterior circulation: Normal appearance of the vertebral arteries, vertebrobasilar junction and basilar artery, as well as main branch vessels. Robust bilateral posterior communicating arteries. Normal appearance of the posterior cerebral arteries. No hemodynamically significant stenosis, dissection, contrast extravasation or aneurysm within the anterior nor posterior circulation. Mild general luminal irregularity of the intracranial vessels. IMPRESSION: CTA NECK: Moderately motion degraded examination, limited by LEFT venous contamination. No definite acute vascular process or hemodynamically significant stenosis. 3.5 x 5.7 cm dominant LEFT thyroid not oral for which follow up thyroid sonogram is recommended on a nonemergent basis. CTA HEAD: LEFT M2 segment occlusion corresponding to sylvian fissure density on today's CT, likely acute. Complete circle of Willis.  Mild intracranial atherosclerosis. Acute findings discussed with and reconfirmed by Dr.CHARLES STEWART on 09/28/2015 at 4:05 am. Electronically Signed   By: Awilda Metro M.D.   On: 09/28/2015 04:08   Ct Head Wo Contrast  09/28/2015  CLINICAL DATA:  60 year old female with left M2 occlusion treated endovascularly. Follow-up. Subsequent encounter. EXAM: CT HEAD WITHOUT CONTRAST TECHNIQUE:  Contiguous axial images were obtained from the base of the skull through the vertex without intravenous contrast. COMPARISON:  09/28/2015. FINDINGS: Now well delineated is left middle cerebral artery moderate size infarct (best noted superiorly). Gyriform enhancement left temporal-parietal lobe may reflect result of recent angiogram indicating region at risk for infarct/ischemia. Hyperdense material within the left sylvian fissure may represent combination of blood and/ or contrast. No hydrocephalus or midline shift. Moderate chronic small vessel disease type changes. Remote basal ganglia infarcts bilaterally. Remote right paracentral pontine infarct. Mucosal thickening ethmoid sinus air cells. IMPRESSION: Now well delineated is left middle cerebral artery moderate size infarct (best noted superiorly). Gyriform enhancement left temporal-parietal lobe may reflect result of recent angiogram indicating region at risk for infarct/ischemia. Hyperdense material within the left sylvian fissure may represent combination of blood and/ or contrast. Moderate chronic small vessel disease type changes. Remote basal ganglia infarcts bilaterally. Remote right paracentral pontine infarct. These results will be called to the ordering clinician or representative by the Radiologist Assistant, and communication documented in the PACS or zVision Dashboard. Electronically Signed   By: Lacy Duverney M.D.   On: 09/28/2015 07:42   Ct Head Wo Contrast  09/28/2015  CLINICAL DATA:  Right facial droop.  Code stroke. EXAM: CT HEAD WITHOUT CONTRAST TECHNIQUE: Contiguous axial images were obtained from the base of the skull through the vertex without intravenous contrast. COMPARISON:  10/27/2014 FINDINGS: Skull and Sinuses:Negative for fracture or destructive process. No sinusitis or mastoiditis Visualized orbits: Nonspecific dysconjugate gaze. Brain: Extensive chronic small vessel disease with ischemic gliosis throughout the deep cerebral white  matter and chronic (based on prior) lacunar infarcts in the bilateral thalamus and deep white matter tracts. Chronic patchy ischemic change also  seen in the pons, greater on the right. There is new calcific density in the mid left sylvian fissure, in line with a MCA branch, possible calcific embolus. No evidence of cortical infarct. Aspects is 10. No hemorrhage, hydrocephalus, or shift. Critical Value/emergent results were called by telephone at the time of interpretation on 09/28/2015 at 1:42 am to Dr. Loleta Rose , who verbally acknowledged these results. IMPRESSION: 1. New calcific density in the left sylvian fissure, possible calcific distal M2 embolus. No visible acute infarct or hemorrhage. 2. Advanced chronic small vessel disease with multiple lacunar infarcts. Electronically Signed   By: Marnee Spring M.D.   On: 09/28/2015 01:49   Ct Angio Neck W/cm &/or Wo/cm  09/28/2015  CLINICAL DATA:  Stroke-like symptoms after cocaine use. History of hepatitis-C, renal failure, hypertension, stroke. EXAM: CT ANGIOGRAPHY HEAD AND NECK TECHNIQUE: Multidetector CT imaging of the head and neck was performed using the standard protocol during bolus administration of intravenous contrast. Multiplanar CT image reconstructions and MIPs were obtained to evaluate the vascular anatomy. Carotid stenosis measurements (when applicable) are obtained utilizing NASCET criteria, using the distal internal carotid diameter as the denominator. CONTRAST:  80mL OMNIPAQUE IOHEXOL 350 MG/ML SOLN COMPARISON:  CT head September 28, 2015 at 0124 hours and MRI/MRA head June 01, 2011 FINDINGS: Moderately motion degraded examination. CTA NECK Aortic arch: Normal appearance of the thoracic arch, normal branch pattern. Motion obscures the origins of the arch vessels. Moderate calcific atherosclerosis of the aortic arch. Arch vessels appear patent. Right carotid system: Common carotid artery is widely patent, coursing in a straight line fashion.  Normal appearance of the carotid bifurcation without hemodynamically significant stenosis by NASCET criteria. Mild eccentric calcific atherosclerosis. Normal appearance of the included internal carotid artery. Left carotid system: Contrast reflux into LEFT internal jugular vein limits evaluation of the LEFT Common carotid artery due to streak artifact. Common carotid artery is widely patent, coursing in a straight line fashion. Normal appearance of the carotid bifurcation without hemodynamically significant stenosis by NASCET criteria. Normal appearance of the included internal carotid artery. Vertebral arteries:Origins of the vertebral arteries obscured by patient motion. Normal appearance of the vertebral arteries, which appear widely patent. Skeleton: No acute osseous process though bone windows have not been submitted. Multiple absent teeth. Other neck: Soft tissues of the neck are nonacute though, not tailored for evaluation. 3.5 x 5.7 cm dominant LEFT thyroid nodule mildly displacing and narrowing the trachea to the RIGHT. CTA HEAD Anterior circulation: Normal appearance of the cervical internal carotid arteries, petrous, cavernous and supra clinoid internal carotid arteries. Moderate calcific atherosclerosis the carotid siphons. Widely patent anterior communicating artery. LEFT M2 occlusion, density within LEFT sylvian fissure on today's examination likely represent is thromboembolism. Normal appearance of the anterior cerebral arteries. Posterior circulation: Normal appearance of the vertebral arteries, vertebrobasilar junction and basilar artery, as well as main branch vessels. Robust bilateral posterior communicating arteries. Normal appearance of the posterior cerebral arteries. No hemodynamically significant stenosis, dissection, contrast extravasation or aneurysm within the anterior nor posterior circulation. Mild general luminal irregularity of the intracranial vessels. IMPRESSION: CTA NECK: Moderately  motion degraded examination, limited by LEFT venous contamination. No definite acute vascular process or hemodynamically significant stenosis. 3.5 x 5.7 cm dominant LEFT thyroid not oral for which follow up thyroid sonogram is recommended on a nonemergent basis. CTA HEAD: LEFT M2 segment occlusion corresponding to sylvian fissure density on today's CT, likely acute. Complete circle of Willis.  Mild intracranial atherosclerosis. Acute findings discussed with and  reconfirmed by Dr.CHARLES STEWART on 09/28/2015 at 4:05 am. Electronically Signed   By: Awilda Metro M.D.   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 Subsequent encounter. EXAM: MRI HEAD WITHOUT CONTRAST TECHNIQUE: Multiplanar, multiecho pulse sequences of the brain and surrounding structures were obtained without intravenous contrast. COMPARISON:  09/28/2015 head CT. FINDINGS: Large acute hemorrhagic left hemispheric infarct involves the majority of the left middle cerebral artery distribution (left frontal lobe, left parietal lobe, left temporal lobe, left corona radiata and posterior left operculum region). Hemorrhage is most notable involving the left posterior temporal -parietal aspect of this acute infarct. Local mass effect upon the left lateral ventricle. At most there is minimal bowing of the septum to the right. Several small acute nonhemorrhagic right hemispheric infarcts involving right caudate head, superior aspect of the post limb right internal capsule, right frontal lobe, superior right lenticular nucleus and right occipital lobe. Several small acute nonhemorrhagic cerebellar infarcts greater on the right. Remote left lenticular nucleus infarct or hematoma with blood-stained cleft now noted. Moderate to marked chronic small vessel disease  changes. Atrophy without hydrocephalus. No intracranial mass lesion noted on this unenhanced exam. Scattered blood breakdown products right cerebellum and pons consistent with prior episodes hemorrhagic ischemia. Major intracranial vascular structures are patent. Slightly heterogeneous bone marrow of the clivus and upper cervical spine without discrete mass identified. Stability can be confirmed on follow-up. Mild exophthalmos.  Cervical medullary junction unremarkable. IMPRESSION: Large acute hemorrhagic left hemispheric infarct involves the majority of the left middle cerebral artery distribution. Hemorrhage is most notable involving the left posterior temporal -parietal aspect of this acute infarct. Local mass effect upon the left lateral ventricle. At most there is minimal bowing of the septum to the right. Several small acute nonhemorrhagic right hemispheric infarcts involving right caudate head, superior aspect of the post limb right internal capsule, right frontal lobe, superior right lenticular nucleus and right occipital lobe. Several small acute nonhemorrhagic cerebellar infarcts greater on the right. Remote left lenticular nucleus infarct or hematoma with blood-stained cleft now noted. Moderate to marked chronic small vessel disease changes. Atrophy. Electronically Signed   By: Lacy Duverney M.D.   On: 09/28/2015 13:16   Dg Chest Portable 1 View  09/28/2015  CLINICAL DATA:  Intubation EXAM: PORTABLE CHEST 1 VIEW COMPARISON:  10/27/2014 FINDINGS: High endotracheal tube with tip at the thoracic inlet and near the glottis. Low lung volumes with diffuse increase in interstitial opacity. Mild cardiomegaly. Aortic tortuosity accentuated by volumes and rotation. These results were called by telephone at the time of interpretation on 09/28/2015 at 4:18 am to RN Shanda Bumps, who verbally acknowledged these results. IMPRESSION: 1. High endotracheal tube with balloon near the glottis. Recommend advancement by 3 to 4 cm.  2. Diffuse increase in lung opacity which may be from low volumes and atelectasis or aspiration. Electronically Signed   By: Marnee Spring M.D.   On: 09/28/2015 04:20    Labs:  CBC:  Recent Labs  09/28/15 0112 09/28/15 0938  WBC 18.7* 8.6  HGB 12.0 13.0  HCT 35.8 39.0  PLT 276 PLATELET CLUMPS NOTED ON SMEAR, UNABLE TO ESTIMATE    COAGS:  Recent Labs  09/28/15 0112  INR 0.98  APTT 27    BMP:  Recent Labs  09/28/15 0112  NA 142  K 4.4  CL 110  CO2 26  GLUCOSE 157*  BUN 26*  CALCIUM 9.1  CREATININE 1.55*  GFRNONAA 35*  GFRAA 41*    LIVER FUNCTION TESTS:  Recent Labs  09/28/15 0112  BILITOT 0.4  AST 26  ALT 16  ALKPHOS 94  PROT 7.6  ALBUMIN 4.1    Assessment and Plan:  CVA L MCA revascularization 2/2 Will follow  Electronically Signed: Alaina Donati A 09/28/2015, 2:44 PM   I spent a total of 15 Minutes at the the patient's bedside AND on the patient's hospital floor or unit, greater than 50% of which was counseling/coordinating care for L MCA revascuularization

## 2015-09-28 NOTE — Progress Notes (Signed)
EEG Completed; Results Pending  

## 2015-09-28 NOTE — Transfer of Care (Signed)
Immediate Anesthesia Transfer of Care Note  Patient: Linda [name]  Procedure(s) Performed: Procedure(s): RADIOLOGY WITH ANESTHESIA (N/A)  Patient Location: ICU  Anesthesia Type:General  Level of Consciousness: sedated, unresponsive and Patient remains intubated per anesthesia plan  Airway & Oxygen Therapy: Patient remains intubated per anesthesia plan, Patient placed on Ventilator (see vital sign flow sheet for setting) and report given to bedside RN.  Post-op Assessment: Report given to RN and Post -op Vital signs reviewed and stable  Post vital signs: Reviewed  Last Vitals:  Filed Vitals:   09/28/15 0745 09/28/15 0800  BP: 135/91 116/77  Pulse: 99 89  Temp:  36.2 C  Resp: 20 25    Complications: No apparent anesthesia complications

## 2015-09-28 NOTE — Progress Notes (Signed)
Titrated FIO2 to 50%.

## 2015-09-28 NOTE — Progress Notes (Signed)
STROKE TEAM PROGRESS NOTE   HISTORY OF PRESENT ILLNESS Linda [name] is an 60 y.o. female with a history of intracerebral hemorrhage in 2012, hypertension, cocaine abuse, renal failure and hepatitis C, seen in the ED at [facility] after becoming unresponsive with leftward gaze and right facial droop area there was also concern for possible seizure activity initially. She was reportedly last known well at midnight (09/28/2015 at 0000) CT scan of her head showed no acute intracranial abnormality except for small increased density posterior left sylvian fissure. Patient was not a TPA candidate because of a history of intracerebral hemorrhage in 2012. She was transferred to [facility] for further management, including possible interventional radiology procedures. CT angiogram of left M2 segment occlusion responding to the high density abnormality seen in sylvian fissure on CT scan. Study was otherwise unremarkable. Patient was intubated in the ED at [facility] for airway protection. Urine drug screen was positive for cocaine. A pressure was elevated at 210/127. Cardene drip IV was started. She was sent to IR where she received complete TICI3 revascularization of the L MCA with mechanical thrombectomy with trevo and IA Integrilin. Post intervention, she was admitted to the neuro ICU for further evaluation and treatment.   SUBJECTIVE (INTERVAL HISTORY) Her niece is at the bedside. Pt POA is his sister who is not at bedside. EEG tech is at bedside for EEG setup. Pt just came back from angio suite for intervention with recannulization of left M2 inferior branch TICI 3 flow.   OBJECTIVE Temp:  [97.2 F (36.2 C)] 97.2 F (36.2 C) (02/02 0800) Pulse Rate:  [89-145] 89 (02/02 0800) Cardiac Rhythm:  [-]  Resp:  [17-25] 25 (02/02 0800) BP: (116-210)/(77-135) 116/77 mmHg (02/02 0800) SpO2:  [92 %-100 %] 100 % (02/02 0800) Arterial Line BP: (148-181)/(65-83) 148/65 mmHg (02/02 0800) FiO2 (%):  [70 %-100 %] 70 % (02/02 0440)  CBC:   Recent Labs Lab 09/28/15 0112  WBC 18.7*  NEUTROABS 2.6  HGB 12.0  HCT 35.8  MCV 92.5  PLT 276    Basic Metabolic Panel:  Recent Labs Lab 09/28/15 0112  NA 142  K 4.4  CL 110  CO2 26  GLUCOSE 157*  BUN 26*  CREATININE 1.55*  CALCIUM 9.1    Lipid Panel:    Component Value Date/Time   CHOL 181 05/29/2011 0400   TRIG 288* 05/29/2011 0400   HDL 47 05/29/2011 0400   CHOLHDL 3.9 05/29/2011 0400   VLDL 58* 05/29/2011 0400   LDLCALC 76 05/29/2011 0400   HgbA1c:  Lab Results  Component Value Date   HGBA1C 6.6* 05/28/2011   Urine Drug Screen:    Component Value Date/Time   LABOPIA NONE DETECTED 09/28/2015 0226   LABBENZ NONE DETECTED 09/28/2015 0226   AMPHETMU NONE DETECTED 09/28/2015 0226   THCU NONE DETECTED 09/28/2015 0226   LABBARB NONE DETECTED 09/28/2015 0226      IMAGING I have personally reviewed the radiological images below and agree with the radiology interpretations.  Ct Head Wo Contrast 09/28/2015  1. New calcific density in the left sylvian fissure, possible calcific distal M2 embolus. No visible acute infarct or hemorrhage. 2. Advanced chronic small vessel disease with multiple lacunar infarcts.   CTA NECK 09/28/2015   Moderately motion degraded examination, limited by LEFT venous contamination. No definite acute vascular process or hemodynamically significant stenosis. 3.5 x 5.7 cm dominant LEFT thyroid not oral for which follow up thyroid sonogram is recommended on a nonemergent basis.   CTA HEAD  09/28/2015   LEFT M2 segment occlusion corresponding to sylvian fissure density on today's CT, likely acute. Complete circle of Willis.  Mild intracranial atherosclerosis. Moderate calcific atherosclerosis the carotid siphons.   Cerebral Angiogram 09/28/2015 S/P Lt internal carotid arteriogram followed by complete revascularization of occluded dominant inferior division of LT MCA with x pass with trevoprovue 4 mmx 30 mm retrieval device and 6 mg of  superselective Integrelin achieving a TICI 3 flow.  MRI  Large acute hemorrhagic left hemispheric infarct involves the majority of the left middle cerebral artery distribution. Hemorrhage is most notable involving the left posterior temporal -parietal aspect of this acute infarct. Local mass effect upon the left lateral ventricle. At most there is minimal bowing of the septum to the right. Several small acute nonhemorrhagic right hemispheric infarcts involving right caudate head, superior aspect of the post limb right internal capsule, right frontal lobe, superior right lenticular nucleus and right occipital lobe. Several small acute nonhemorrhagic cerebellar infarcts greater on the right. Remote left lenticular nucleus infarct or hematoma with blood-stained cleft now noted. Moderate to marked chronic small vessel disease changes. Atrophy.  EEG pending   Physical exam  Temp:  [97.2 F (36.2 C)] 97.2 F (36.2 C) (02/02 0800) Pulse Rate:  [87-145] 99 (02/02 1000) Resp:  [13-25] 24 (02/02 1000) BP: (102-210)/(66-135) 126/73 mmHg (02/02 1000) SpO2:  [88 %-100 %] 88 % (02/02 1000) Arterial Line BP: (138-181)/(52-83) 166/60 mmHg (02/02 1000) FiO2 (%):  [60 %-100 %] 60 % (02/02 1000) Weight:  [231 lb 11.3 oz (105.1 kg)] 231 lb 11.3 oz (105.1 kg) (02/02 0800)  General - obese, well developed, intubated on propofol.  Ophthalmologic - Fundi not visualized due to positioning.  Cardiovascular - Regular rate and rhythm.  Neuro - intubated on propofol, not responsive to voice, not open eyes. Pupils 3mm, not reactive to light, no corneal, weak gag, breathing over the vent. On pain stimulation, she has slight withdraw BLEs, minimal withdraw BUEs. DTR 1+, no babinski bilaterally. Sensation, coordination or gait are not able to test.   ASSESSMENT/PLAN Linda [name] is a 60 y.o. female with history of left BG ICH in 2012, hypertension, cocaine abuse, renal failure and hepatitis C  presenting with altered mental status, right side weakness and leftward gaze. Also concerning for possible seizure activity initial presentation. She did not receive IV t-PA due to hx ICH, CTA showed a L M2 occlusion, she was sent to IR where she received complete TICI3 revascularization of the L MCA with mechanical thrombectomy with trevo and IA Integrilin.  Stroke:  left MCA infarct with hemorrhagic transformation s/p revascularizationf of L M2, multifocal scattered infarcts involving bilateral posterior and anterior circulation, consistent with cardioembolic source although pt does have significant large vessel intracranial atherosclerosis.  Resultant  Intubated and unresponsive  MRI - multifocal scattered infarcts involving bilateral posterior and anterior circulation, largest at left MCA territory with hemorrhagic transformation  Repeat CT - left MCA hemorrhagic infarct   CTA head L M2 occlusion, b/l ICA supraclinoid segment significant atherosclerosis, L>R with moderate to severe stenosis.  CTA neck L thyroid nodule  2D Echo  pending   LDL unable to calculate due to high TG   HgbA1c pending  SCDs for VTE prophylaxis  Diet NPO time specified  No antithrombotic prior to admission, now on No antithrombotic due to hemorrhagic transformation.  Ongoing aggressive stroke risk factor management  Therapy recommendations:  pending   Disposition:  pending   ? Seizure  Questionable seizure  presentation initially as reported in chart  EEG pending  Hold off AEDs at this time  on propofol  Hx of ICH  2012 left BG ICH  BP high at 200s and urine positive with cocaine   S/p trach and PEG at that time  Lost follow up  Acute respiratory failure  Intubated in ED due to airway protection  On propofol with ventilation  CCM following  Hypertensive Emergency  BP as high as 202/124  Started on cardene drip  Stable now with tight parameters post IR <  160  Hyperlipidemia  Home meds:  No statin  LDL not able to calculate due to high TG , goal < 70  Total chol 170  Other Stroke Risk Factors  Cocaine abuse - urine positive both in 2012 and this admission  Obesity  Other Active Problems  Hepatitis C  Hx Dysphagia secondary to ICH in 2012 with PEG    Hospital day # 0  This patient is critically ill due to large left MCA infarct with left M2 occlusion s/p intervention, hemorrhagic transformation and at significant risk of neurological worsening, death form recurrent infarcts, increased ICH, brain herniation, cerebral edema, heart failure. This patient's care requires constant monitoring of vital signs, hemodynamics, respiratory and cardiac monitoring, review of multiple databases, neurological assessment, discussion with family, other specialists and medical decision making of high complexity. I spent 40 minutes of neurocritical care time in the care of this patient.   Marvel Plan, MD PhD Stroke Neurology 09/28/2015 3:34 PM      To contact Stroke Continuity provider, please refer to [email or web address]. After hours, contact General Neurology

## 2015-09-28 NOTE — Progress Notes (Signed)
The 9 fr sheath was removed with an exoseal by Magda Bernheim at 15:00. Hemostasis was achieved at 1540.  A pressure  dressing was applied. No complications. Elnita Maxwell Archer Moist

## 2015-09-28 NOTE — ED Notes (Signed)
Pt transported to CT via stretcher.  

## 2015-09-28 NOTE — ED Notes (Signed)
Teleneuro SOC completed

## 2015-09-28 NOTE — Progress Notes (Signed)
SLP Cancellation Note  Patient Details Name: RACINE ERBY MRN: [id number] DOB: 1955/10/16   Cancelled treatment:       Reason Eval/Treat Not Completed: Patient not medically ready, remains intubated. Will continue to follow.   Maxcine Ham, M.A. CCC-SLP [phone]  Maxcine Ham 09/28/2015, 9:38 AM

## 2015-09-28 NOTE — Progress Notes (Signed)
RT advanced ETT from 22 at the lip to 25 at the lip per MD order. Pt tolerated well. RT to monitor as needed

## 2015-09-28 NOTE — ED Notes (Signed)
Pt comes from [facility], was brought there by EMS, was found on the floor by friend at home today, pt has hx of old stroke with R sided weakness, weakness was worse today on right side with left sided gaze. LNW unknown. Pt has hx of cocaine use and found in her system today. Pt nonverbal upon arrival.

## 2015-09-28 NOTE — ED Provider Notes (Signed)
[facility] Emergency Department Provider Note  ____________________________________________  Time seen: Approximately 1:13 AM  I have reviewed the triage vital signs and the nursing notes.   HISTORY  Chief Complaint Code Stroke  History and exam limited by obtundation/decreased responsiveness  HPI Linda [name] is a 60 y.o. female who was found during the evaluation to have a history of hemorrhagic stroke in 2012 with some mild residual right-sided flaccid paralysis.  She arrived by EMS with a chief complaint of unresponsiveness.  The history provided to the first responders/medics was very vague, but reportedly the patient was at a friend's house and rolled off of a couch.  It was unclear when this happened but likely within the last 2 hours.  Her friends found that she was unresponsive although she was still breathing.  They did not report observing any seizure-like activity.  The patient's point of care blood sugar was in the 140s for EMS and they transported her to the emergency department.  Upon arrival to the emergency department, the patient appears awake and is having some lip smacking behaviors but does not track or attend to anything going on around her.  She withdraws from painful stimuli to her chest and her left arm but has no response to painful stimuli of the right arm.  Immediately after her arrival I activated code stroke while attempting to obtain collateral information.  Her vital signs were stable upon arrival.    Past Medical History  Diagnosis Date  . Hep C w/o coma, chronic (HCC)   . Cocaine abuse   . Acute renal failure (HCC)   . Hypertension   . Stroke [facility]) 2012    hemorrhagic    There are no active problems to display for this patient.   History reviewed. No pertinent past surgical history.  Current Outpatient Rx  Name  Route  Sig  Dispense  Refill  . amLODipine-benazepril (LOTREL) 10-20 MG per capsule   Oral   Take 1  capsule by mouth daily.           . clonazePAM (KLONOPIN) 0.5 MG tablet   Oral   Take 0.5 mg by mouth 2 (two) times daily as needed. For anxiety         . haloperidol (HALDOL) 2 MG tablet   Oral   Take 2 mg by mouth 2 (two) times daily.           . metoCLOPramide (REGLAN) 10 MG/10ML SOLN   Oral   Take 10 mg by mouth as needed. For upset stomach          . multivitamin with minerals (CERTA-VITE) LIQD   Oral   Take 5 mLs by mouth daily.           . pantoprazole (PROTONIX) 40 MG tablet   Oral   Take 40 mg by mouth daily.             Allergies Review of patient's allergies indicates no known allergies.  No family history on file.  Social History Social History  Substance Use Topics  . Smoking status: Unknown If Ever Smoked  . Smokeless tobacco: None  . Alcohol Use: No    Review of Systems Unable to obtain due to obtundation  ____________________________________________  ED Triage Vitals  Enc Vitals Group     BP 09/28/15 0111 162/135 mmHg     Pulse Rate 09/28/15 0111 106     Resp 09/28/15 0200 19     Temp --  Temp src --      SpO2 09/28/15 0111 92 %     Weight --      Height --      Head Cir --      Peak Flow --      Pain Score --      Pain Loc --      Pain Edu? --      Excl. in GC? --       PHYSICAL EXAM: Constitutional: Awake, not attending or tracking anything going on in the room.  However she is not in acute distress either. Eyes: Partial left gaze deviation - not locked into the left, but with a strong preference to the left Head: Atraumatic. Nose: No congestion/rhinnorhea. Mouth/Throat: Mucous membranes are moist.  Oropharynx non-erythematous. Neck: No stridor.   Cardiovascular: Borderline tachycardia, regular rhythm. Grossly normal heart sounds.  Good peripheral circulation. Respiratory: Normal respiratory effort.  No retractions. Lungs CTAB. Gastrointestinal: Obese.  Soft and nontender. No distention. No abdominal bruits. No  CVA tenderness. Musculoskeletal: No lower extremity tenderness nor edema.  No joint effusions. Neurologic:  Dense right-sided hemiparesis with no movement of the right upper extremity even with painful stimulus.  She does not react even with a facial expression changed to painful stimuli of the right upper extremity.  She does withdraw from painful stimulus of the right lower extremity.  She is moving her left arm without apparent reason but without any difficulties and she does localize pain of the left upper extremity.  She appears to have severe aphasia and is not communicative at all, and does not seem to be able to understand what we are saying. Skin:  Skin is warm, dry and intact. No rash noted.   ____________________________________________   LABS (all labs ordered are listed, but only abnormal results are displayed)  Labs Reviewed  COMPREHENSIVE METABOLIC PANEL - Abnormal; Notable for the following:    Glucose, Bld 157 (*)    BUN 26 (*)    Creatinine, Ser 1.55 (*)    GFR calc non Af Amer 35 (*)    GFR calc Af Amer 41 (*)    All other components within normal limits  URINE DRUG SCREEN, QUALITATIVE ([facility] ONLY) - Abnormal; Notable for the following:    Tricyclic, Ur Screen POSITIVE (*)    Cocaine Metabolite,Ur [place] POSITIVE (*)    All other components within normal limits  ETHANOL  APTT  TROPONIN I  PROTIME-INR  CBC WITH DIFFERENTIAL/PLATELET  URINALYSIS COMPLETEWITH MICROSCOPIC ([facility] ONLY)   ____________________________________________  EKG  ED ECG REPORT I, Verne Cove, the attending physician, personally viewed and interpreted this ECG.  Date: 09/28/2015 EKG Time: 01:15 Rate: 102 Rhythm: Sinus tachycardia QRS Axis: normal Intervals: normal ST/T Wave abnormalities: Non-specific ST segment / T-wave changes, but no evidence of acute ischemia. Conduction Disturbances: none Narrative Interpretation:  unremarkable  ____________________________________________  RADIOLOGY Aura Camps, Shakirra Buehler, personally discussed these images and results by phone with the on-call radiologist and used this discussion as part of my medical decision making.    Ct Head Wo Contrast  09/28/2015  CLINICAL DATA:  Right facial droop.  Code stroke. EXAM: CT HEAD WITHOUT CONTRAST TECHNIQUE: Contiguous axial images were obtained from the base of the skull through the vertex without intravenous contrast. COMPARISON:  10/27/2014 FINDINGS: Skull and Sinuses:Negative for fracture or destructive process. No sinusitis or mastoiditis Visualized orbits: Nonspecific dysconjugate gaze. Brain: Extensive chronic small vessel disease with ischemic gliosis throughout the deep cerebral white matter  and chronic (based on prior) lacunar infarcts in the bilateral thalamus and deep white matter tracts. Chronic patchy ischemic change also seen in the pons, greater on the right. There is new calcific density in the mid left sylvian fissure, in line with a MCA branch, possible calcific embolus. No evidence of cortical infarct. Aspects is 10. No hemorrhage, hydrocephalus, or shift. Critical Value/emergent results were called by telephone at the time of interpretation on 09/28/2015 at 1:42 am to Dr. Loleta Rose , who verbally acknowledged these results. IMPRESSION: 1. New calcific density in the left sylvian fissure, possible calcific distal M2 embolus. No visible acute infarct or hemorrhage. 2. Advanced chronic small vessel disease with multiple lacunar infarcts. Electronically Signed   By: Marnee Spring M.D.   On: 09/28/2015 01:49    ____________________________________________   PROCEDURES  Procedure(s) performed: None  Critical Care performed: Yes, see critical care note(s)   CRITICAL CARE Performed by: Loleta Rose   Total critical care time: 75 minutes  Critical care time was exclusive of separately billable procedures and treating  other patients.  Critical care was necessary to treat or prevent imminent or life-threatening deterioration.  Critical care was time spent personally by me on the following activities: development of treatment plan with patient and/or surrogate as well as nursing, discussions with consultants, evaluation of patient's response to treatment, examination of patient, obtaining history from patient or surrogate, ordering and performing treatments and interventions, ordering and review of laboratory studies, ordering and review of radiographic studies, pulse oximetry and re-evaluation of patient's condition.  ____________________________________________   INITIAL IMPRESSION / ASSESSMENT AND PLAN / ED COURSE  Pertinent labs & imaging results that were available during my care of the patient were reviewed by me and considered in my medical decision making (see chart for details).  NIH Stroke Scale  Interval: Baseline Time: 1:15 AM Person Administering Scale: Karen Huhta  Administer stroke scale items in the order listed. Record performance in each category after each subscale exam. Do not go back and change scores. Follow directions provided for each exam technique. Scores should reflect what the patient does, not what the clinician thinks the patient can do. The clinician should record answers while administering the exam and work quickly. Except where indicated, the patient should not be coached (i.e., repeated requests to patient to make a special effort).   1a  Level of consciousness: 2=not alert, requires repeated stimulation to attend, or is obtunded and requires strong or painful stimulation to make movements (not stereotyped)  1b. LOC questions:  2=Performs neither task correctly  1c. LOC commands: 2=Performs neither task correctly  2.  Best Gaze: 1=partial gaze palsy  3.  Visual: 0=No visual loss  4. Facial Palsy: 2=Partial paralysis (total or near total paralysis of the lower face)   5a.  Motor left arm: 4=No Movement  5b.  Motor right arm: 4=No movement  6a. motor left leg: 4=No movement  6b  Motor right leg:  4=No movement  7. Limb Ataxia: 0=Absent  8.  Sensory: 2=Severe to total sensory loss; patient is not aware of being touched in face, arm, leg  9. Best Language:  3=Mute, global aphasia; no usable speech or auditory comprehension  10. Dysarthria: 2=Severe; patient speech is so slurred as to be unintelligible in the absence of or our of proportion to any dysphagia, or is mute/anarthric  11. Extinction and Inattention: 2=Profound hemi-inattention or hemi-inattention to more than one modality. Does not recognize own hand or orients only to  one side of space  12. Distal motor function: 2=No voluntary extension after 5 seconds. Movement of the fingers at another time are not scored   Total:   36    After my initial evaluation we proceeded with the code stroke workup.  I spoke by phone with the on-call radiologist who explained that there are no acute findings but that there is a calcified lesion in the left sylvian fissure that could potentially be causing a calcific embolus and resulting infarction.  I spoke on 2 occasions with the teleneurologist who evaluated the patient remotely and is concerned about acute CVA.  However, we discussed that the time of onset is very unclear (likely about 2 hours), there is polysubstance abuse history, and according to her sister she had a hemorrhagic stroke in 2012.  All of these make her a poor TPA candidate and the hemorrhagic stroke is a contraindication.  Additionally, her sister does not want to pursue TPA given her history.  I called and spoke by phone with Dr. Roseanne Reno with [facility] neurolog[facility] and explained the situation in detail.  We discussed it and agreed that she would be best served with transfer to his facility.  He requested transfer to the emergency department where they could triage her for her cerebral perfusion study  and/or possible intervention.  I also then spoke by phone with the ED physician and he would be receiving her and let him know about the situation and plan.  Patient was hemodynamically stable and protecting her airway well when she left with [facility] for transport to [facility] at approximately 3:00 AM.   ____________________________________________  FINAL CLINICAL IMPRESSION(S) / ED DIAGNOSES  Final diagnoses:  Cerebrovascular accident (CVA), unspecified mechanism (HCC)      NEW MEDICATIONS STARTED DURING THIS VISIT:  New Prescriptions   No medications on file     Loleta Rose, MD 09/28/15 [id number]

## 2015-09-28 NOTE — Procedures (Signed)
History: 60 year old female with a history of stroke  Sedation: Propofol  Technique: This is a 21 channel routine scalp EEG performed at the bedside with bipolar and monopolar montages arranged in accordance to the international 10/20 system of electrode placement. One channel was dedicated to EKG recording.    Background: The background consists of low voltage alpha and beta activities. There are occasional sleep spindles which are seen bilaterally, though possible attenuation in the left central region.  Photic stimulation: Physiologic driving is now performed  EEG Abnormalities: 1) possible attenuation of sleep structures in the left central region 2) lack of waking state  Clinical Interpretation: This EEG is consistent with a sleep/sedation EEG with a possible suggestion of a left central cerebral dysfunction, though I don't think this is definite by this study.  Ritta Slot, MD [facility] [phone]  If 7pm- 7am, please page neurology on call as listed in AMION.

## 2015-09-28 NOTE — Progress Notes (Addendum)
RT suctioned pt both oral and endotracheal prior to being transported to MRI.

## 2015-09-28 NOTE — Progress Notes (Signed)
Wasted 210 ml of fentanyl in the sink. Witnessed by Docia Barrier, RN and Emelia Salisbury, RN

## 2015-09-28 NOTE — Consult Note (Signed)
PULMONARY / CRITICAL CARE MEDICINE   Name: Linda [name] MRN: [id number] DOB: [birth year]    ADMISSION DATE:  09/28/2015 CONSULTATION DATE:  2/2  REFERRING MD:  Neuro  CHIEF COMPLAINT:  Obtunded   HISTORY OF PRESENT ILLNESS:   60 yo AAF with hx of stroke 2012 that required trach/peg and with residual right side weakness was seen at [facility] after falling off a couch. Code stroke called per EDP and transferred to [facility] for IR procedure for revascularization of of left MCA. She was then admitted to NSICU and PCCM asked to help manage vent and critical care issues. She remains intubated and sedated on arrival to ICU. Goals of extubation will be clarified with neuro.   PAST MEDICAL HISTORY :  She  has a past medical history of Hep C w/o coma, chronic (HCC); Cocaine abuse; Acute renal failure (HCC); Hypertension; and Stroke [facility]) (2012).  PAST SURGICAL HISTORY: She  has no past surgical history on file.  No Known Allergies  No current facility-administered medications on file prior to encounter.   Current Outpatient Prescriptions on File Prior to Encounter  Medication Sig  . amLODipine-benazepril (LOTREL) 10-20 MG per capsule Take 1 capsule by mouth daily.    . clonazePAM (KLONOPIN) 0.5 MG tablet Take 0.5 mg by mouth 2 (two) times daily as needed. For anxiety  . haloperidol (HALDOL) 2 MG tablet Take 2 mg by mouth 2 (two) times daily.    . metoCLOPramide (REGLAN) 10 MG/10ML SOLN Take 10 mg by mouth as needed. For upset stomach   . multivitamin with minerals (CERTA-VITE) LIQD Take 5 mLs by mouth daily.    . pantoprazole (PROTONIX) 40 MG tablet Take 40 mg by mouth daily.      FAMILY HISTORY:  Her has no family status information on file.   SOCIAL HISTORY: She  reports that she uses illicit drugs (Cocaine). She reports that she does not drink alcohol.  REVIEW OF SYSTEMS:   NA  SUBJECTIVE:  Sedated on vent  VITAL SIGNS: BP 175/97 mmHg  Pulse 132  Resp 19  SpO2  97%  HEMODYNAMICS:    VENTILATOR SETTINGS: Vent Mode:  [-] PRVC FiO2 (%):  [70 %-100 %] 70 % Set Rate:  [16 bmp-20 bmp] 20 bmp Vt Set:  [540 mL] 540 mL PEEP:  [5 cmH20] 5 cmH20 Plateau Pressure:  [22 cmH20] 22 cmH20  INTAKE / OUTPUT:    PHYSICAL EXAMINATION: General:  Obese AAF with NMB on board. Intubated Neuro:  NMB on board HEENT:  ETT-> vent, short neck Cardiovascular:  HSR RRR ST 104 Lungs: CTA Abdomen:  Obese soft decreased bs Musculoskeletal:  intact Skin: warm and dry  LABS:  BMET  Recent Labs Lab 09/28/15 0112  NA 142  K 4.4  CL 110  CO2 26  BUN 26*  CREATININE 1.55*  GLUCOSE 157*    Electrolytes  Recent Labs Lab 09/28/15 0112  CALCIUM 9.1    CBC  Recent Labs Lab 09/28/15 0112  WBC 18.7*  HGB 12.0  HCT 35.8  PLT 276    Coag's  Recent Labs Lab 09/28/15 0112  APTT 27  INR 0.98    Sepsis Markers No results for input(s): LATICACIDVEN, PROCALCITON, O2SATVEN in the last 168 hours.  ABG  Recent Labs Lab 09/28/15 0436  PHART 7.255*  PCO2ART 60.3*  PO2ART 180.0*    Liver Enzymes  Recent Labs Lab 09/28/15 0112  AST 26  ALT 16  ALKPHOS 94  BILITOT 0.4  ALBUMIN 4.1  Cardiac Enzymes  Recent Labs Lab 09/28/15 0112  TROPONINI <0.03    Glucose No results for input(s): GLUCAP in the last 168 hours.  Imaging Ct Angio Head W/cm &/or Wo Cm  09/28/2015  CLINICAL DATA:  Stroke-like symptoms after cocaine use. History of hepatitis-C, renal failure, hypertension, stroke. EXAM: CT ANGIOGRAPHY HEAD AND NECK TECHNIQUE: Multidetector CT imaging of the head and neck was performed using the standard protocol during bolus administration of intravenous contrast. Multiplanar CT image reconstructions and MIPs were obtained to evaluate the vascular anatomy. Carotid stenosis measurements (when applicable) are obtained utilizing NASCET criteria, using the distal internal carotid diameter as the denominator. CONTRAST:  80mL OMNIPAQUE  IOHEXOL 350 MG/ML SOLN COMPARISON:  CT head September 28, 2015 at 0124 hours and MRI/MRA head June 01, 2011 FINDINGS: Moderately motion degraded examination. CTA NECK Aortic arch: Normal appearance of the thoracic arch, normal branch pattern. Motion obscures the origins of the arch vessels. Moderate calcific atherosclerosis of the aortic arch. Arch vessels appear patent. Right carotid system: Common carotid artery is widely patent, coursing in a straight line fashion. Normal appearance of the carotid bifurcation without hemodynamically significant stenosis by NASCET criteria. Mild eccentric calcific atherosclerosis. Normal appearance of the included internal carotid artery. Left carotid system: Contrast reflux into LEFT internal jugular vein limits evaluation of the LEFT Common carotid artery due to streak artifact. Common carotid artery is widely patent, coursing in a straight line fashion. Normal appearance of the carotid bifurcation without hemodynamically significant stenosis by NASCET criteria. Normal appearance of the included internal carotid artery. Vertebral arteries:Origins of the vertebral arteries obscured by patient motion. Normal appearance of the vertebral arteries, which appear widely patent. Skeleton: No acute osseous process though bone windows have not been submitted. Multiple absent teeth. Other neck: Soft tissues of the neck are nonacute though, not tailored for evaluation. 3.5 x 5.7 cm dominant LEFT thyroid nodule mildly displacing and narrowing the trachea to the RIGHT. CTA HEAD Anterior circulation: Normal appearance of the cervical internal carotid arteries, petrous, cavernous and supra clinoid internal carotid arteries. Moderate calcific atherosclerosis the carotid siphons. Widely patent anterior communicating artery. LEFT M2 occlusion, density within LEFT sylvian fissure on today's examination likely represent is thromboembolism. Normal appearance of the anterior cerebral arteries.  Posterior circulation: Normal appearance of the vertebral arteries, vertebrobasilar junction and basilar artery, as well as main branch vessels. Robust bilateral posterior communicating arteries. Normal appearance of the posterior cerebral arteries. No hemodynamically significant stenosis, dissection, contrast extravasation or aneurysm within the anterior nor posterior circulation. Mild general luminal irregularity of the intracranial vessels. IMPRESSION: CTA NECK: Moderately motion degraded examination, limited by LEFT venous contamination. No definite acute vascular process or hemodynamically significant stenosis. 3.5 x 5.7 cm dominant LEFT thyroid not oral for which follow up thyroid sonogram is recommended on a nonemergent basis. CTA HEAD: LEFT M2 segment occlusion corresponding to sylvian fissure density on today's CT, likely acute. Complete circle of Willis.  Mild intracranial atherosclerosis. Acute findings discussed with and reconfirmed by Dr.CHARLES STEWART on 09/28/2015 at 4:05 am. Electronically Signed   By: Awilda Metro M.D.   On: 09/28/2015 04:08   Ct Head Wo Contrast  09/28/2015  CLINICAL DATA:  Right facial droop.  Code stroke. EXAM: CT HEAD WITHOUT CONTRAST TECHNIQUE: Contiguous axial images were obtained from the base of the skull through the vertex without intravenous contrast. COMPARISON:  10/27/2014 FINDINGS: Skull and Sinuses:Negative for fracture or destructive process. No sinusitis or mastoiditis Visualized orbits: Nonspecific dysconjugate gaze. Brain: Extensive  chronic small vessel disease with ischemic gliosis throughout the deep cerebral white matter and chronic (based on prior) lacunar infarcts in the bilateral thalamus and deep white matter tracts. Chronic patchy ischemic change also seen in the pons, greater on the right. There is new calcific density in the mid left sylvian fissure, in line with a MCA branch, possible calcific embolus. No evidence of cortical infarct. Aspects is  10. No hemorrhage, hydrocephalus, or shift. Critical Value/emergent results were called by telephone at the time of interpretation on 09/28/2015 at 1:42 am to Dr. Loleta Rose , who verbally acknowledged these results. IMPRESSION: 1. New calcific density in the left sylvian fissure, possible calcific distal M2 embolus. No visible acute infarct or hemorrhage. 2. Advanced chronic small vessel disease with multiple lacunar infarcts. Electronically Signed   By: Marnee Spring M.D.   On: 09/28/2015 01:49   Ct Angio Neck W/cm &/or Wo/cm  09/28/2015  CLINICAL DATA:  Stroke-like symptoms after cocaine use. History of hepatitis-C, renal failure, hypertension, stroke. EXAM: CT ANGIOGRAPHY HEAD AND NECK TECHNIQUE: Multidetector CT imaging of the head and neck was performed using the standard protocol during bolus administration of intravenous contrast. Multiplanar CT image reconstructions and MIPs were obtained to evaluate the vascular anatomy. Carotid stenosis measurements (when applicable) are obtained utilizing NASCET criteria, using the distal internal carotid diameter as the denominator. CONTRAST:  80mL OMNIPAQUE IOHEXOL 350 MG/ML SOLN COMPARISON:  CT head September 28, 2015 at 0124 hours and MRI/MRA head June 01, 2011 FINDINGS: Moderately motion degraded examination. CTA NECK Aortic arch: Normal appearance of the thoracic arch, normal branch pattern. Motion obscures the origins of the arch vessels. Moderate calcific atherosclerosis of the aortic arch. Arch vessels appear patent. Right carotid system: Common carotid artery is widely patent, coursing in a straight line fashion. Normal appearance of the carotid bifurcation without hemodynamically significant stenosis by NASCET criteria. Mild eccentric calcific atherosclerosis. Normal appearance of the included internal carotid artery. Left carotid system: Contrast reflux into LEFT internal jugular vein limits evaluation of the LEFT Common carotid artery due to streak  artifact. Common carotid artery is widely patent, coursing in a straight line fashion. Normal appearance of the carotid bifurcation without hemodynamically significant stenosis by NASCET criteria. Normal appearance of the included internal carotid artery. Vertebral arteries:Origins of the vertebral arteries obscured by patient motion. Normal appearance of the vertebral arteries, which appear widely patent. Skeleton: No acute osseous process though bone windows have not been submitted. Multiple absent teeth. Other neck: Soft tissues of the neck are nonacute though, not tailored for evaluation. 3.5 x 5.7 cm dominant LEFT thyroid nodule mildly displacing and narrowing the trachea to the RIGHT. CTA HEAD Anterior circulation: Normal appearance of the cervical internal carotid arteries, petrous, cavernous and supra clinoid internal carotid arteries. Moderate calcific atherosclerosis the carotid siphons. Widely patent anterior communicating artery. LEFT M2 occlusion, density within LEFT sylvian fissure on today's examination likely represent is thromboembolism. Normal appearance of the anterior cerebral arteries. Posterior circulation: Normal appearance of the vertebral arteries, vertebrobasilar junction and basilar artery, as well as main branch vessels. Robust bilateral posterior communicating arteries. Normal appearance of the posterior cerebral arteries. No hemodynamically significant stenosis, dissection, contrast extravasation or aneurysm within the anterior nor posterior circulation. Mild general luminal irregularity of the intracranial vessels. IMPRESSION: CTA NECK: Moderately motion degraded examination, limited by LEFT venous contamination. No definite acute vascular process or hemodynamically significant stenosis. 3.5 x 5.7 cm dominant LEFT thyroid not oral for which follow up thyroid sonogram is  recommended on a nonemergent basis. CTA HEAD: LEFT M2 segment occlusion corresponding to sylvian fissure density on  today's CT, likely acute. Complete circle of Willis.  Mild intracranial atherosclerosis. Acute findings discussed with and reconfirmed by Dr.CHARLES STEWART on 09/28/2015 at 4:05 am. Electronically Signed   By: Awilda Metro M.D.   On: 09/28/2015 04:08   Dg Chest Portable 1 View  09/28/2015  CLINICAL DATA:  Intubation EXAM: PORTABLE CHEST 1 VIEW COMPARISON:  10/27/2014 FINDINGS: High endotracheal tube with tip at the thoracic inlet and near the glottis. Low lung volumes with diffuse increase in interstitial opacity. Mild cardiomegaly. Aortic tortuosity accentuated by volumes and rotation. These results were called by telephone at the time of interpretation on 09/28/2015 at 4:18 am to RN Shanda Bumps, who verbally acknowledged these results. IMPRESSION: 1. High endotracheal tube with balloon near the glottis. Recommend advancement by 3 to 4 cm. 2. Diffuse increase in lung opacity which may be from low volumes and atelectasis or aspiration. Electronically Signed   By: Marnee Spring M.D.   On: 09/28/2015 04:20     STUDIES:  2/2 2 d>>  CULTURES: 2/2 bc x 2>> 2/2 uc>> 2/2 sputum>>  ANTIBIOTICS: None  SIGNIFICANT EVENTS: Code stroke 2/2  LINES/TUBES: 2/2 OTT 2/2 rt femoral sheath>> 2/3 left rad a line  DISCUSSION: 60 yo AAF hx of stroke and new neurological event + cocaine. Now s/p interventional procedure for L MCA occlusion.   ASSESSMENT / PLAN:  PULMONARY A: VDRF post IR procedure on 2/2 P:   PRVC at 8cc.kg Continue mandatory ventilation until timing of any further neuro workup or imaging determined this am  ETT was high - has been advanced   CARDIOVASCULAR A:  Hx of HTN, exacerbated by cocaine use.  P:  Antihypertensives > will start Cardene drip to keep sbp 111-140 Wean dilt drip to off Sedate as needed till NMB wears off Echocardiogram   RENAL Lab Results  Component Value Date   CREATININE 1.55* 09/28/2015   CREATININE 1.27 02/28/2014   CREATININE 1.15 04/10/2012    CREATININE 1.32* 11/13/2011   CREATININE 0.81 06/17/2011   CREATININE 0.78 06/13/2011    A:   ARI, likely related to HTN, volume status and dye load for procedure.  P:   Hydration Follow BMP   GASTROINTESTINAL A:   Hx of  Dysphagia with Peg in 2012 P:   PPI Swallow eval prior to feeding, speech ready to follow once extubated  HEMATOLOGIC A:   No acute issues P:  PAS  INFECTIOUS A:   No immediate concern for infection P:   Monitor infection parameters   ENDOCRINE A:   Hyperglycemia   P:   SSI ordered  NEUROLOGIC A:   Post Let MCA recannulization of inferior division 2/2 Hx of hemorraghic stroke 2012 with residual rt side weakness. Required trach/peg in 2012 Substance abuse/ + for cocaine    P:   RASS goal: 0 Sedation for vent tolerance until no further procedures Follow neuro exam and Neurology recs s/p new CVA. ? Degree of new deficits   FAMILY  - Updates: no family at bedside 2/2  - Inter-disciplinary family meet or Palliative Care meeting due by: 10/05/15   Brett Canales Minor ACNP Adolph Pollack PCCM Pager [phone] till 3 pm If no answer page [phone] 09/28/2015, 7:28 AM   Attending Note:  I have examined patient, reviewed labs, studies and notes. I have discussed the case with S Minor, and I agree with the data and plans as  amended above. 60 yo woman with severe HTN, prior CVA with residual debilitation, now admitted with acute L MCA stroke requiring interventional procedure. On my eval she is a sedated obese woman, weaning off dilt gtt and titrating up nicardipine. She is scheduled for MRI and EEG this am. Plan to keep her sedated on propofol until testing completed. Assess for wake up and any focal deficits once these are completed. No plan to extubate for now - hopefully move in direction SBT by 2/3. Independent critical care time is 40 minutes.   Levy Pupa, MD, PhD 09/28/2015, 10:11 AM [facility] [phone] or if no answer  [phone]

## 2015-09-29 ENCOUNTER — Inpatient Hospital Stay (HOSPITAL_COMMUNITY): Payer: Medicaid Other

## 2015-09-29 ENCOUNTER — Ambulatory Visit (HOSPITAL_COMMUNITY): Payer: Medicaid Other

## 2015-09-29 ENCOUNTER — Encounter (HOSPITAL_COMMUNITY): Payer: Self-pay | Admitting: Radiology

## 2015-09-29 DIAGNOSIS — I6340 Cerebral infarction due to embolism of unspecified cerebral artery: Secondary | ICD-10-CM

## 2015-09-29 DIAGNOSIS — Z8679 Personal history of other diseases of the circulatory system: Secondary | ICD-10-CM

## 2015-09-29 DIAGNOSIS — I429 Cardiomyopathy, unspecified: Secondary | ICD-10-CM

## 2015-09-29 LAB — CBC WITH DIFFERENTIAL/PLATELET
BASOS ABS: 0 10*3/uL (ref 0.0–0.1)
BASOS PCT: 0 %
EOS ABS: 0 10*3/uL (ref 0.0–0.7)
EOS PCT: 0 %
HCT: 34.5 % — ABNORMAL LOW (ref 36.0–46.0)
HEMOGLOBIN: 11.3 g/dL — AB (ref 12.0–15.0)
LYMPHS ABS: 4.3 10*3/uL — AB (ref 0.7–4.0)
Lymphocytes Relative: 38 %
MCH: 30.5 pg (ref 26.0–34.0)
MCHC: 32.8 g/dL (ref 30.0–36.0)
MCV: 93 fL (ref 78.0–100.0)
Monocytes Absolute: 0.9 10*3/uL (ref 0.1–1.0)
Monocytes Relative: 8 %
NEUTROS PCT: 54 %
Neutro Abs: 6.1 10*3/uL (ref 1.7–7.7)
PLATELETS: 288 10*3/uL (ref 150–400)
RBC: 3.71 MIL/uL — AB (ref 3.87–5.11)
RDW: 14.1 % (ref 11.5–15.5)
WBC: 11.3 10*3/uL — AB (ref 4.0–10.5)

## 2015-09-29 LAB — GLUCOSE, CAPILLARY
GLUCOSE-CAPILLARY: 103 mg/dL — AB (ref 65–99)
GLUCOSE-CAPILLARY: 111 mg/dL — AB (ref 65–99)
GLUCOSE-CAPILLARY: 132 mg/dL — AB (ref 65–99)
GLUCOSE-CAPILLARY: 94 mg/dL (ref 65–99)
Glucose-Capillary: 164 mg/dL — ABNORMAL HIGH (ref 65–99)
Glucose-Capillary: 115 mg/dL — ABNORMAL HIGH (ref 65–99)

## 2015-09-29 LAB — BASIC METABOLIC PANEL
Anion gap: 8 (ref 5–15)
BUN: 20 mg/dL (ref 6–20)
CHLORIDE: 110 mmol/L (ref 101–111)
CO2: 24 mmol/L (ref 22–32)
Calcium: 8.5 mg/dL — ABNORMAL LOW (ref 8.9–10.3)
Creatinine, Ser: 1.43 mg/dL — ABNORMAL HIGH (ref 0.44–1.00)
GFR, EST AFRICAN AMERICAN: 45 mL/min — AB (ref >60)
GFR, EST NON AFRICAN AMERICAN: 39 mL/min — AB (ref >60)
Glucose, Bld: 144 mg/dL — ABNORMAL HIGH (ref 65–99)
POTASSIUM: 4.5 mmol/L (ref 3.5–5.1)
SODIUM: 142 mmol/L (ref 135–145)

## 2015-09-29 LAB — URINE CULTURE
CULTURE: NO GROWTH
SPECIAL REQUESTS: NORMAL

## 2015-09-29 LAB — MAGNESIUM: MAGNESIUM: 1.8 mg/dL (ref 1.7–2.4)

## 2015-09-29 LAB — PROCALCITONIN: Procalcitonin: 0.1 ng/mL

## 2015-09-29 LAB — HEMOGLOBIN A1C
HEMOGLOBIN A1C: 6.9 % — AB (ref 4.8–5.6)
MEAN PLASMA GLUCOSE: 151 mg/dL

## 2015-09-29 LAB — PHOSPHORUS: PHOSPHORUS: 4.6 mg/dL (ref 2.5–4.6)

## 2015-09-29 MED ORDER — HYDRALAZINE HCL 20 MG/ML IJ SOLN
10.0000 mg | INTRAMUSCULAR | Status: DC | PRN
  Administered 2015-09-30: 20 mg via INTRAVENOUS
  Filled 2015-09-29: qty 1

## 2015-09-29 MED ORDER — ADULT MULTIVITAMIN W/MINERALS CH
1.0000 | ORAL_TABLET | Freq: Every day | ORAL | Status: DC
  Administered 2015-09-29 – 2015-10-25 (×24): 1
  Filled 2015-09-29 (×25): qty 1

## 2015-09-29 MED ORDER — HEPARIN SODIUM (PORCINE) 5000 UNIT/ML IJ SOLN
5000.0000 [IU] | Freq: Three times a day (TID) | INTRAMUSCULAR | Status: DC
  Administered 2015-09-29 – 2015-10-25 (×69): 5000 [IU] via SUBCUTANEOUS
  Filled 2015-09-29 (×69): qty 1

## 2015-09-29 MED ORDER — VITAL HIGH PROTEIN PO LIQD
1000.0000 mL | ORAL | Status: DC
  Administered 2015-09-29 – 2015-10-24 (×22): 1000 mL
  Filled 2015-09-29 (×24): qty 1000

## 2015-09-29 MED ORDER — ASPIRIN 325 MG PO TABS
325.0000 mg | ORAL_TABLET | Freq: Every day | ORAL | Status: DC
  Administered 2015-09-29 – 2015-10-03 (×5): 325 mg via ORAL
  Filled 2015-09-29 (×5): qty 1

## 2015-09-29 MED ORDER — PRO-STAT SUGAR FREE PO LIQD
30.0000 mL | Freq: Two times a day (BID) | ORAL | Status: DC
  Administered 2015-09-29 – 2015-10-25 (×48): 30 mL
  Filled 2015-09-29 (×49): qty 30

## 2015-09-29 NOTE — Progress Notes (Signed)
Referring Physician(s): Xu  Chief Complaint:  CVA S/p L MCA clot retrieval 2/2  Subjective: Remains intubated/sedated  Allergies: Review of patient's allergies indicates no known allergies.  Medications:  Current facility-administered medications:  .   stroke: mapping our early stages of recovery book, , Does not apply, Once, Noel Christmas .  0.9 %  sodium chloride infusion, , Intravenous, Continuous, Noel Christmas, 0  at 09/28/15 [phone] .  0.9 %  sodium chloride infusion, , Intravenous, Continuous, Julieanne Cotton, MD, Last Rate: 75 mL/hr at 09/29/15 1100 .  acetaminophen (TYLENOL) tablet 1,000 mg, 1,000 mg, Oral, Q6H PRN, 1,000 mg at 09/29/15 0023 **OR** acetaminophen (TYLENOL) suppository 650 mg, 650 mg, Rectal, Q6H PRN, Julieanne Cotton, MD, 650 mg at 09/29/15 0033 .  antiseptic oral rinse solution (CORINZ), 7 mL, Mouth Rinse, 10 times per day, Marvel Plan, MD, 7 mL at 09/29/15 1100 .  aspirin tablet 325 mg, 325 mg, Oral, Daily, Marvel Plan, MD, 325 mg at 09/29/15 1100 .  chlorhexidine gluconate (PERIDEX) 0.12 % solution 15 mL, 15 mL, Mouth Rinse, BID, Marvel Plan, MD, 15 mL at 09/29/15 0800 .  feeding supplement (PRO-STAT SUGAR FREE 64) liquid 30 mL, 30 mL, Per Tube, BID, Arlyss Gandy, RD .  feeding supplement (VITAL HIGH PROTEIN) liquid 1,000 mL, 1,000 mL, Per Tube, Continuous, Arlyss Gandy, RD .  fentaNYL (SUBLIMAZE) injection 25-200 mcg, 25-200 mcg, Intravenous, Q12H PRN, Vilinda Blanks Minor, NP, 100 mcg at 09/29/15 0541 .  heparin injection 5,000 Units, 5,000 Units, Subcutaneous, 3 times per day, Marvel Plan, MD, 5,000 Units at 09/29/15 0945 .  hydrALAZINE (APRESOLINE) injection 10-40 mg, 10-40 mg, Intravenous, Q4H PRN, Lewie Loron, NP .  insulin aspart (novoLOG) injection 0-9 Units, 0-9 Units, Subcutaneous, 6 times per day, Vilinda Blanks Minor, NP, 2 Units at 09/29/15 0401 .  midazolam (VERSED) 50 mg in sodium chloride 0.9 % 50 mL (1 mg/mL) infusion, 0-10 mg/hr,  Intravenous, Continuous, Leslye Peer, MD, Last Rate: 1 mL/hr at 09/29/15 1126, 1 mg/hr at 09/29/15 1126 .  midazolam (VERSED) bolus via infusion 1-2 mg, 1-2 mg, Intravenous, Q2H PRN, Leslye Peer, MD .  multivitamin with minerals tablet 1 tablet, 1 tablet, Per Tube, Daily, Arlyss Gandy, RD .  nicardipine (CARDENE)  in 0.83% saline IV DOUBLE STRENGTH infusion (0.2 mg/ml), 3-15 mg/hr, Intravenous, Continuous, Marvel Plan, MD, Stopped at 09/29/15 0415 .  ondansetron (ZOFRAN) injection 4 mg, 4 mg, Intravenous, Q6H PRN, Julieanne Cotton, MD .  pantoprazole (PROTONIX) injection 40 mg, 40 mg, Intravenous, QHS, Noel Christmas, 40 mg at 09/28/15 2217 .  promethazine (PHENERGAN) injection 6.25-12.5 mg, 6.25-12.5 mg, Intravenous, Q15 min PRN, Eilene Ghazi, MD    Vital Signs: BP 143/75 mmHg  Pulse 110  Temp(Src) 100.9 F (38.3 C) (Axillary)  Resp 21  Ht  (1.702 m)  Wt 231 lb 11.3 oz (105.1 kg)  BMI 36.28 kg/m2  SpO2 100%  Physical Exam  Skin: Skin is warm.  Rt groin site clean and dry No bleeding  no hematoma Rt foot 2+ pulses  Nursing note and vitals reviewed.   Labs:  CBC:  Recent Labs  09/28/15 0112 09/28/15 0938 09/29/15 0420  WBC 18.7* 8.6 11.3*  HGB 12.0 13.0 11.3*  HCT 35.8 39.0 34.5*  PLT 276 PLATELET CLUMPS NOTED ON SMEAR, UNABLE TO ESTIMATE 288    COAGS:  Recent Labs  09/28/15 0112  INR 0.98  APTT 27    BMP:  Recent Labs  09/28/15 0112 09/29/15 0420  NA 142 142  K 4.4 4.5  CL 110 110  CO2 26 24  GLUCOSE 157* 144*  BUN 26* 20  CALCIUM 9.1 8.5*  CREATININE 1.55* 1.43*  GFRNONAA 35* 39*  GFRAA 41* 45*    LIVER FUNCTION TESTS:  Recent Labs  09/28/15 0112  BILITOT 0.4  AST 26  ALT 16  ALKPHOS 94  PROT 7.6  ALBUMIN 4.1    Assessment and Plan: CVA L MCA revascularization 2/2 Sedated Will report to Dr. Corliss Skains  Electronically Signed: Brayton El 09/29/2015, 12:59 PM   I spent a total of 15 Minutes at the  the patient's bedside AND on the patient's hospital floor or unit, greater than 50% of which was counseling/coordinating care for L MCA revascuularization

## 2015-09-29 NOTE — Progress Notes (Signed)
Inpatient Diabetes Program Recommendations  AACE/ADA: New Consensus Statement on Inpatient Glycemic Control (2015)  Target Ranges:  Prepandial:   less than 140 mg/dL      Peak postprandial:   less than 180 mg/dL (1-2 hours)      Critically ill patients:  140 - 180 mg/dL    Results for Linda [name], Linda [name] (MRN [id number]) as of 09/29/2015 08:06  Ref. Range 09/28/2015 08:09 09/28/2015 15:44 09/28/2015 19:48 09/28/2015 23:48 09/29/2015 03:43  Glucose-Capillary Latest Ref Range: 65-99 mg/dL 191 (H) 478 (H) 295 (H) 132 (H) 164 (H)   Results for [name], [name] (MRN [id number]) as of 09/29/2015 08:06  Ref. Range 09/28/2015 09:38  Hemoglobin A1C Latest Ref Range: 4.8-5.6 % 6.9 (H)    Admit with: Code Stroke  History: DM, Cocaine Abuse, HTN, CVA  Home DM Meds: Lantus 60 units QHS  Current Insulin Orders: Novolog Sensitive SSI (0-9 units) Q4 hours      -Note through review of Care Everywhere tab in EPIC, patient does have previous History of DM.  Per [facility] records, patient is supposed to be taking Lantus 60 units QHS at home.  -Note Novolog Sensitive SSI (0-9 units) Q4 hours started yesterday at 8am.  CBGs have improved since initiation of Novolog insulin.  -Surprisingly, current Hemoglobin A1c level= 6.9%.  -Also note patient tested positive for cocaine this admission.     --Will follow patient during hospitalization--  Ambrose Finland RN, MSN, CDE Diabetes Coordinator Inpatient Glycemic Control Team Team Pager: [phone] (8a-5p)

## 2015-09-29 NOTE — Progress Notes (Signed)
PT Cancellation Note  Patient Details Name: Linda [name] MRN: [id number] DOB: [birth year]   Cancelled Treatment:    Reason Eval/Treat Not Completed: Patient not medically ready.  Pt currently on bedrest.  Will need increased activity orders once appropriate for PT and mobility.     Aniaya Bacha, Alison Murray 09/29/2015, 8:35 AM

## 2015-09-29 NOTE — Progress Notes (Signed)
PULMONARY / CRITICAL CARE MEDICINE   Name: Linda [name] MRN: [id number] DOB: [birth year]    ADMISSION DATE:  09/28/2015 CONSULTATION DATE:  2/2  REFERRING MD:  Neuro  CHIEF COMPLAINT:  Obtunded   HISTORY OF PRESENT ILLNESS:   60 yo AAF with hx of stroke 2012 that required trach/peg and with residual right side weakness was seen at [facility] after falling off a couch. Code stroke called per EDP and transferred to [facility] for IR procedure for revascularization of of left MCA. She was then admitted to NSICU and PCCM asked to help manage vent and critical care issues. She remains intubated and sedated on arrival to ICU. Goals of extubation will be clarified with neuro.   SUBJECTIVE:  Remains on full vent support. Failed awakening and weaning trials. Blood pressure stable; off Cardene gtt.   VITAL SIGNS: BP 132/80 mmHg  Pulse 109  Temp(Src) 100.2 F (37.9 C) (Axillary)  Resp 30  Ht  (1.702 m)  Wt 231 lb 11.3 oz (105.1 kg)  BMI 36.28 kg/m2  SpO2 100%  HEMODYNAMICS:    VENTILATOR SETTINGS: Vent Mode:  [-] PSV;CPAP FiO2 (%):  [40 %-100 %] 40 % Set Rate:  [20 bmp] 20 bmp Vt Set:  [490 mL] 490 mL PEEP:  [5 cmH20] 5 cmH20 Pressure Support:  [5 cmH20] 5 cmH20 Plateau Pressure:  [12 cmH20-21 cmH20] 15 cmH20  INTAKE / OUTPUT: I/O last 3 completed shifts: In: 3552.5 [I.V.:3552.5] Out: 2235 [Urine:2235]  PHYSICAL EXAMINATION: General:  Intubated and sedated Neuro: Withdraws to noxious stimulus HEENT:  ETT-> vent, short neck, pupils pinpoint, unreactive Cardiovascular: ST at 106; S1/S2, no MRG Lungs: Bilateral airflow Abdomen:  Obese, normal BS, S/NT/ND Musculoskeletal: No joint deformities Skin: warm and dry  LABS:  BMET  Recent Labs Lab 09/28/15 0112 09/29/15 0420  NA 142 142  K 4.4 4.5  CL 110 110  CO2 26 24  BUN 26* 20  CREATININE 1.55* 1.43*  GLUCOSE 157* 144*    Electrolytes  Recent Labs Lab 09/28/15 0112 09/29/15 0420  CALCIUM 9.1 8.5*  MG  --  1.8   PHOS  --  4.6    CBC  Recent Labs Lab 09/28/15 0112 09/28/15 0938 09/29/15 0420  WBC 18.7* 8.6 11.3*  HGB 12.0 13.0 11.3*  HCT 35.8 39.0 34.5*  PLT 276 PLATELET CLUMPS NOTED ON SMEAR, UNABLE TO ESTIMATE 288    Coag's  Recent Labs Lab 09/28/15 0112  APTT 27  INR 0.98    Sepsis Markers  Recent Labs Lab 09/28/15 0938 09/29/15 0420  LATICACIDVEN 1.5  --   PROCALCITON <0.10 0.10    ABG  Recent Labs Lab 09/28/15 0436 09/28/15 0805  PHART 7.255* 7.364  PCO2ART 60.3* 40.1  PO2ART 180.0* 304*    Liver Enzymes  Recent Labs Lab 09/28/15 0112  AST 26  ALT 16  ALKPHOS 94  BILITOT 0.4  ALBUMIN 4.1    Cardiac Enzymes  Recent Labs Lab 09/28/15 0112  TROPONINI <0.03    Glucose  Recent Labs Lab 09/28/15 0809 09/28/15 1544 09/28/15 1948 09/28/15 2348 09/29/15 0343 09/29/15 0820  GLUCAP 227* 242* 180* 132* 164* 94    Imaging Ct Head Wo Contrast  09/29/2015  CLINICAL DATA:  Followup LEFT M2 occlusion treated endovascularly. EXAM: CT HEAD WITHOUT CONTRAST TECHNIQUE: Contiguous axial images were obtained from the base of the skull through the vertex without intravenous contrast. COMPARISON:  MRI of the head September 28, 2015 and CT head September 28, 2015 FINDINGS: Large  wedge-like hypodensities LEFT frontal, temporal, parietal and occipital lobes and same distribution as reduced diffusion on yesterday's MRI. Faint density within LEFT posterior frontal lobe residual contrast staining without lobar hematoma. Multiple tiny densities LEFT frontal LEFT frontal lobe favoring residual intravascular contrast, unchanged from recent CT. Worsening mass-effect with 3 mm LEFT-to-RIGHT midline shift, new effacement of the LEFT atrium without ventricular entrapment. Small bilateral basal ganglia lacunar infarcts. Patchy to confluent white matter changes compatible with chronic small vessel ischemic disease. No abnormal extra-axial fluid collection. Basal cisterns are patent.  Moderate calcific atherosclerosis the carotid siphons. Mild paranasal sinus mucosal thickening. Mastoid air cells are well aerated. Ocular globes and orbital contents are normal. No skull fracture. IMPRESSION: Evolving large LEFT MCA territory infarct with small amount of residual contrast staining, no hemorrhagic conversion. Increasing edema and mass effect with 3 mm LEFT-to-RIGHT midline shift, no ventricular entrapment. Old bilateral basal ganglia lacunar infarcts. Moderate to severe chronic small vessel ischemic disease. Electronically Signed   By: Awilda Metro M.D.   On: 09/29/2015 05:44   Mr Brain Wo Contrast  09/28/2015  CLINICAL DATA:  60 year old hypertensive female with history of hepatitis-C and cocaine abuse presenting unresponsive with left middle cerebral artery stroke treated endovascularly. Subsequent encounter. EXAM: MRI HEAD WITHOUT CONTRAST TECHNIQUE: Multiplanar, multiecho pulse sequences of the brain and surrounding structures were obtained without intravenous contrast. COMPARISON:  09/28/2015 head CT. FINDINGS: Large acute hemorrhagic left hemispheric infarct involves the majority of the left middle cerebral artery distribution (left frontal lobe, left parietal lobe, left temporal lobe, left corona radiata and posterior left operculum region). Hemorrhage is most notable involving the left posterior temporal -parietal aspect of this acute infarct. Local mass effect upon the left lateral ventricle. At most there is minimal bowing of the septum to the right. Several small acute nonhemorrhagic right hemispheric infarcts involving right caudate head, superior aspect of the post limb right internal capsule, right frontal lobe, superior right lenticular nucleus and right occipital lobe. Several small acute nonhemorrhagic cerebellar infarcts greater on the right. Remote left lenticular nucleus infarct or hematoma with blood-stained cleft now noted. Moderate to marked chronic small vessel disease  changes. Atrophy without hydrocephalus. No intracranial mass lesion noted on this unenhanced exam. Scattered blood breakdown products right cerebellum and pons consistent with prior episodes hemorrhagic ischemia. Major intracranial vascular structures are patent. Slightly heterogeneous bone marrow of the clivus and upper cervical spine without discrete mass identified. Stability can be confirmed on follow-up. Mild exophthalmos.  Cervical medullary junction unremarkable. IMPRESSION: Large acute hemorrhagic left hemispheric infarct involves the majority of the left middle cerebral artery distribution. Hemorrhage is most notable involving the left posterior temporal -parietal aspect of this acute infarct. Local mass effect upon the left lateral ventricle. At most there is minimal bowing of the septum to the right. Several small acute nonhemorrhagic right hemispheric infarcts involving right caudate head, superior aspect of the post limb right internal capsule, right frontal lobe, superior right lenticular nucleus and right occipital lobe. Several small acute nonhemorrhagic cerebellar infarcts greater on the right. Remote left lenticular nucleus infarct or hematoma with blood-stained cleft now noted. Moderate to marked chronic small vessel disease changes. Atrophy. Electronically Signed   By: Lacy Duverney M.D.   On: 09/28/2015 13:16   Dg Abd Portable 1v  09/29/2015  CLINICAL DATA:  Orogastric tube placement. EXAM: PORTABLE ABDOMEN - 1 VIEW COMPARISON:  Abdominal radiograph of June 04, 2011 FINDINGS: In orogastric tube is in place with the tip in the region  of the pylorus or proximal duodenum. The stomach does not appear distended with gas. The small and large bowel gas pattern is normal. The lung bases are clear. IMPRESSION: The orogastric tube tip lies in the distal stomach or duodenal bulb. Electronically Signed   By: David  [place] M.D.   On: 09/29/2015 07:41    STUDIES:  Echo 02/20>> EEG 02/20: EEG is  consistent with a sleep/sedation; possible suggestion of a left central cerebral dysfunction  CULTURES: 2/2 bc x 2>> 2/2 uc>> 2/2 sputum>>GRAM POSITIVE COCCI  IN PAIRS, IN CHAINS, IN CLUSTERS  AND FEW GRAM NEGATIVE RODS   ANTIBIOTICS: None  SIGNIFICANT EVENTS: Code stroke 2/2  LINES/TUBES: 2/2 OTT>> 2/2 rt femoral sheath>> 2/3 left rad a line>>  DISCUSSION: 60 yo AAF hx of stroke and new neurological event + cocaine. Now s/p interventional procedure for L MCA occlusion.   ASSESSMENT / PLAN:  PULMONARY A: VDRF post IR procedure on 2/2 P:   PRVC at 8cc.kg Continue mandatory ventilation until timing of any further neuro workup or imaging determined  CARDIOVASCULAR A:  Hx of HTN, exacerbated by cocaine use.  P:  Off cardene gtte BP parameters per neurology Hydralazine prn to maintain SBP<160 Echocardiogram pending   RENAL Lab Results  Component Value Date   CREATININE 1.43* 09/29/2015   CREATININE 1.55* 09/28/2015   CREATININE 1.27 02/28/2014   CREATININE 1.15 04/10/2012   CREATININE 1.32* 11/13/2011   CREATININE 0.81 06/17/2011    A:   ARI, likely related to HTN, volume status and dye load for procedure; slight improvement.  P:   Continue IV FLUIDS Follow BMP  GASTROINTESTINAL A:   Hx of  Dysphagia with Peg in 2012 P:   PPI Swallow eval prior to feeding; speech ready to follow once extubated  HEMATOLOGIC A:   No acute issues P:  PAS  INFECTIOUS A:   No immediate concern for infection P:   Monitor infection parameters   ENDOCRINE A:   Hyperglycemia without h/o diabetes P:   SSI and Blood glucose monitoring per ICU protocol  NEUROLOGIC A:   Post Left MCA recannulization of inferior division 2/2 Hx of hemorraghic stroke 2012 with residual rt side weakness. Required trach/peg in 2012 Substance abuse/ + for cocaine    P:   RASS goal: 0 Sedation for vent tolerance until no further procedures Follow neuro exam and Neurology recs s/p new  CVA.  PT/OT/Speech recs per neurology   FAMILY  - Updates: no family at bedside 2/3  - Inter-disciplinary family meet or Palliative Care meeting due by: 10/05/15  Best Practice: Code Status:  Full code Diet: NPO GI prophylaxis:  PPI. VTE prophylaxis:  SCD's / heparin.  Magdalene S. [facility] ANP-BC Pulmonary and Critical Care Medicine [facility] Pager: [phone]  09/29/2015, 10:44 AM

## 2015-09-29 NOTE — Progress Notes (Signed)
Initial Nutrition Assessment  DOCUMENTATION CODES:   Obesity unspecified  INTERVENTION:  -Start Vital high protein, goal rate @ 54mL/hr and Prostat BID; provides 1280 kcal, 124.5 gm of protein, 903 mL of water -MVI daily  NUTRITION DIAGNOSIS:   Inadequate oral intake related to inability to eat as evidenced by NPO status.  GOAL:   Provide needs based on ASPEN/SCCM guidelines  MONITOR:   Vent status, TF tolerance, Labs  REASON FOR ASSESSMENT:    Ventilator, Consult  ASSESSMENT:   Pt with history of stroke (had trach/peg) admitted L MCA +cocaine s/p clot retrieval 2/2.  2/2 intubated MV 11.8 Temp 37.9  Conducted nutrition focused physical exam, found no signs of fat or muscle wasting, no edema present. Pt discussed during interdisciplinary rounds. Paged Dr Roda Shutters to receive consult to start nutrition.   Medications reviewed.  Labs reviewed; CBG 94-242 OG tube tip in the distal stomach/duodenal bulb   Diet Order:  Diet NPO time specified  Skin:  Reviewed, no issues  Last BM:  unknown  Height:   Ht Readings from Last 1 Encounters:  09/28/15  (1.702 m)    Weight:   Wt Readings from Last 1 Encounters:  09/28/15 231 lb 11.3 oz (105.1 kg)    Ideal Body Weight:  61.3 kg  BMI:  Body mass index is 36.28 kg/(m^2).  Estimated Nutritional Needs:   Kcal:  5638-7564   Protein:  >/= 122  Fluid:  >/=1.2 L  EDUCATION NEEDS:   No education needs identified at this time  Brynda Rim, Dietetic Intern Pager: [phone]

## 2015-09-29 NOTE — Progress Notes (Signed)
0100 found left forearm IV to be red and edematous. Removed IV and called pharmacy regarding Cardene infiltration. Pharmacist recommended warm compress to help absorption. Cardene switched to left hand IV. Put in IV team consult for new IV.

## 2015-09-29 NOTE — Progress Notes (Signed)
STROKE TEAM PROGRESS NOTE   SUBJECTIVE (INTERVAL HISTORY) No family is at the bedside. Currently on versed drip. MRI yesterday confirmed left MCA stroke with mild hemorrhagic transformation. Repeat CT this am showed no significant hemorrhagic transformation seen on CT. Started ASA and subq heparin. Still intubated and not responsive on sedation.  OBJECTIVE Temp:  [99.1 F (37.3 C)-100.9 F (38.3 C)] 100.9 F (38.3 C) (02/03 1200) Pulse Rate:  [98-115] 110 (02/03 1156) Cardiac Rhythm:  [-] Sinus tachycardia (02/03 0800) Resp:  [17-30] 21 (02/03 1156) BP: (114-178)/(57-83) 143/75 mmHg (02/03 1156) SpO2:  [99 %-100 %] 100 % (02/03 1156) Arterial Line BP: (82-180)/(57-112) 112/102 mmHg (02/03 1100) FiO2 (%):  [40 %-60 %] 40 % (02/03 1156)  CBC:   Recent Labs Lab 09/28/15 0112 09/28/15 0938 09/29/15 0420  WBC 18.7* 8.6 11.3*  NEUTROABS 2.6  --  6.1  HGB 12.0 13.0 11.3*  HCT 35.8 39.0 34.5*  MCV 92.5 92.9 93.0  PLT 276 PLATELET CLUMPS NOTED ON SMEAR, UNABLE TO ESTIMATE 288    Basic Metabolic Panel:   Recent Labs Lab 09/28/15 0112 09/29/15 0420  NA 142 142  K 4.4 4.5  CL 110 110  CO2 26 24  GLUCOSE 157* 144*  BUN 26* 20  CREATININE 1.55* 1.43*  CALCIUM 9.1 8.5*  MG  --  1.8  PHOS  --  4.6    Lipid Panel:     Component Value Date/Time   CHOL 170 09/28/2015 0938   TRIG 497* 09/28/2015 0938   TRIG 503* 09/28/2015 0938   HDL 38* 09/28/2015 0938   CHOLHDL 4.5 09/28/2015 0938   VLDL UNABLE TO CALCULATE IF TRIGLYCERIDE OVER 400 mg/dL 21/30/8657 8469   LDLCALC UNABLE TO CALCULATE IF TRIGLYCERIDE OVER 400 mg/dL 62/95/2841 3244   [id number]:  Lab Results  Component Value Date   HGBA1C 6.9* 09/28/2015   Urine Drug Screen:     Component Value Date/Time   LABOPIA NONE DETECTED 09/28/2015 0226   LABBENZ NONE DETECTED 09/28/2015 0226   AMPHETMU NONE DETECTED 09/28/2015 0226   THCU NONE DETECTED 09/28/2015 0226   LABBARB NONE DETECTED 09/28/2015 0226      IMAGING I  have personally reviewed the radiological images below and agree with the radiology interpretations.  Ct Head Wo Contrast 09/28/2015  1. New calcific density in the left sylvian fissure, possible calcific distal M2 embolus. No visible acute infarct or hemorrhage. 2. Advanced chronic small vessel disease with multiple lacunar infarcts.   CTA NECK 09/28/2015   Moderately motion degraded examination, limited by LEFT venous contamination. No definite acute vascular process or hemodynamically significant stenosis. 3.5 x 5.7 cm dominant LEFT thyroid not oral for which follow up thyroid sonogram is recommended on a nonemergent basis.   CTA HEAD 09/28/2015   LEFT M2 segment occlusion corresponding to sylvian fissure density on today's CT, likely acute. Complete circle of Willis.  Mild intracranial atherosclerosis. Moderate calcific atherosclerosis the carotid siphons.   Cerebral Angiogram 09/28/2015 S/P Lt internal carotid arteriogram followed by complete revascularization of occluded dominant inferior division of LT MCA with x pass with trevoprovue 4 mmx 30 mm retrieval device and 6 mg of superselective Integrelin achieving a TICI 3 flow.  MRI  Large acute hemorrhagic left hemispheric infarct involves the majority of the left middle cerebral artery distribution. Hemorrhage is most notable involving the left posterior temporal -parietal aspect of this acute infarct. Local mass effect upon the left lateral ventricle. At most there is minimal bowing of the septum to  the right. Several small acute nonhemorrhagic right hemispheric infarcts involving right caudate head, superior aspect of the post limb right internal capsule, right frontal lobe, superior right lenticular nucleus and right occipital lobe. Several small acute nonhemorrhagic cerebellar infarcts greater on the right. Remote left lenticular nucleus infarct or hematoma with blood-stained cleft now noted. Moderate to marked chronic small vessel  disease changes. Atrophy.  EEG Abnormalities: 1) possible attenuation of sleep structures in the left central region 2) lack of waking state Clinical Interpretation: This EEG is consistent with a sleep/sedation EEG with a possible suggestion of a left central cerebral dysfunction, though I don't think this is definite by this study.  2D echo - pending  LE venous doppler - pending    Physical exam  Temp:  [99.1 F (37.3 C)-100.9 F (38.3 C)] 100.9 F (38.3 C) (02/03 1200) Pulse Rate:  [98-115] 110 (02/03 1156) Resp:  [17-30] 21 (02/03 1156) BP: (114-178)/(57-83) 143/75 mmHg (02/03 1156) SpO2:  [99 %-100 %] 100 % (02/03 1156) Arterial Line BP: (82-180)/(57-112) 112/102 mmHg (02/03 1100) FiO2 (%):  [40 %-60 %] 40 % (02/03 1156)  General - obese, well developed, intubated on versed.  Ophthalmologic - Fundi not visualized due to positioning.  Cardiovascular - Regular rate and rhythm.  Neuro - intubated on versed, not responsive to voice, not open eyes. Pupils 3mm, not reactive to light, slight doll's eye with head movement, weak corneal and gag reflexes, breathing over the vent. On pain stimulation, she has weak withdraw BLEs but not able to against gravity, tendency to localize to pain on the LUE but no movement on the RUE. DTR 1+, no babinski bilaterally. Sensation, coordination or gait are not able to test.   ASSESSMENT/PLAN Ms. FAYE STROHMAN is a 60 y.o. female with history of left BG ICH in 2012, hypertension, cocaine abuse, renal failure and hepatitis C presenting with altered mental status, right side weakness and leftward gaze. Also concerning for possible seizure activity initial presentation. She did not receive IV t-PA due to hx ICH, CTA showed a L M2 occlusion, she was sent to IR where she received complete TICI3 revascularization of the L MCA with mechanical thrombectomy with trevo and IA Integrilin.  Stroke:  left MCA infarct with hemorrhagic transformation s/p  revascularizationf of L M2, multifocal scattered infarcts involving bilateral posterior and anterior circulation, consistent with cardioembolic source although pt does have significant large vessel intracranial atherosclerosis.  Resultant  Intubated and unresponsive  MRI - multifocal scattered infarcts involving bilateral posterior and anterior circulation, largest at left MCA territory with patchy hemorrhagic transformation on MRI but not on CT  Repeat CT - left MCA hemorrhagic infarct   CTA head L M2 occlusion, b/l ICA supraclinoid segment significant atherosclerosis, L>R with moderate to severe stenosis.  CTA neck L thyroid nodule  2D Echo  pending   LE venous doppler pending  LDL unable to calculate due to high TG   HgbA1c 6.9  UDS positive for cocaine  Heparin subq for VTE prophylaxis Diet NPO time specified  No antithrombotic prior to admission, now on ASA 325mg .   Ongoing aggressive stroke risk factor management  Therapy recommendations:  pending   Disposition:  pending   ? Seizure  Questionable seizure presentation initially as reported in chart  EEG showed no seizure but under sedation  Hold off AEDs at this time  on versed drip  Hx of ICH  2012 left BG ICH  BP high at 200s and urine positive with cocaine  S/p trach and PEG at that time  Lost follow up since  Acute respiratory failure  Intubated in ED due to airway protection  On versed with ventilation  CCM following  Hypertensive Emergency  BP as high as 202/124  off cardene drip  Stable now with tight parameters post IR < 160  Hyperlipidemia  Home meds:  No statin  LDL not able to calculate due to high TG , goal < 70  Total chol 170  Consider statin once po access.  Cocaine abuse  urine positive both in 2012 and this admission  Likely one of the causes of intracranial vascular stenosis  Need cessation counseling   Other Stroke Risk Factors  Obesity  Other Active  Problems  Hepatitis C  Hx Dysphagia secondary to ICH in 2012 with PEG  Hospital day # 1  This patient is critically ill due to large left MCA infarct with left M2 occlusion s/p intervention, hemorrhagic transformation and at significant risk of neurological worsening, death form recurrent infarcts, increased ICH, brain herniation, cerebral edema, heart failure. This patient's care requires constant monitoring of vital signs, hemodynamics, respiratory and cardiac monitoring, review of multiple databases, neurological assessment, discussion with family, other specialists and medical decision making of high complexity. I spent 40 minutes of neurocritical care time in the care of this patient.   Marvel Plan, MD PhD Stroke Neurology 09/29/2015 12:38 PM      To contact Stroke Continuity provider, please refer to [email or web address]. After hours, contact General Neurology

## 2015-09-29 NOTE — Progress Notes (Signed)
  Echocardiogram 2D Echocardiogram has been performed.  Leta Jungling M 09/29/2015, 11:32 AM

## 2015-09-30 ENCOUNTER — Inpatient Hospital Stay (HOSPITAL_COMMUNITY): Payer: Medicaid Other

## 2015-09-30 DIAGNOSIS — I639 Cerebral infarction, unspecified: Secondary | ICD-10-CM

## 2015-09-30 DIAGNOSIS — R509 Fever, unspecified: Secondary | ICD-10-CM | POA: Insufficient documentation

## 2015-09-30 DIAGNOSIS — I6350 Cerebral infarction due to unspecified occlusion or stenosis of unspecified cerebral artery: Secondary | ICD-10-CM

## 2015-09-30 DIAGNOSIS — I63412 Cerebral infarction due to embolism of left middle cerebral artery: Secondary | ICD-10-CM

## 2015-09-30 LAB — CULTURE, RESPIRATORY: CULTURE: NORMAL

## 2015-09-30 LAB — CBC
HEMATOCRIT: 34.9 % — AB (ref 36.0–46.0)
HEMOGLOBIN: 11.2 g/dL — AB (ref 12.0–15.0)
MCH: 30.5 pg (ref 26.0–34.0)
MCHC: 32.1 g/dL (ref 30.0–36.0)
MCV: 95.1 fL (ref 78.0–100.0)
Platelets: UNDETERMINED 10*3/uL (ref 150–400)
RBC: 3.67 MIL/uL — AB (ref 3.87–5.11)
RDW: 14.5 % (ref 11.5–15.5)
WBC: 13 10*3/uL — ABNORMAL HIGH (ref 4.0–10.5)

## 2015-09-30 LAB — URINALYSIS, ROUTINE W REFLEX MICROSCOPIC
BILIRUBIN URINE: NEGATIVE
Glucose, UA: NEGATIVE mg/dL
KETONES UR: NEGATIVE mg/dL
NITRITE: NEGATIVE
PH: 5.5 (ref 5.0–8.0)
PROTEIN: NEGATIVE mg/dL
Specific Gravity, Urine: 1.015 (ref 1.005–1.030)

## 2015-09-30 LAB — BASIC METABOLIC PANEL
Anion gap: 12 (ref 5–15)
BUN: 25 mg/dL — AB (ref 6–20)
CHLORIDE: 109 mmol/L (ref 101–111)
CO2: 21 mmol/L — AB (ref 22–32)
CREATININE: 1.28 mg/dL — AB (ref 0.44–1.00)
Calcium: 8.8 mg/dL — ABNORMAL LOW (ref 8.9–10.3)
GFR calc Af Amer: 52 mL/min — ABNORMAL LOW (ref >60)
GFR calc non Af Amer: 45 mL/min — ABNORMAL LOW (ref >60)
Glucose, Bld: 166 mg/dL — ABNORMAL HIGH (ref 65–99)
POTASSIUM: 5.2 mmol/L — AB (ref 3.5–5.1)
SODIUM: 142 mmol/L (ref 135–145)

## 2015-09-30 LAB — CULTURE, RESPIRATORY W GRAM STAIN: Special Requests: NORMAL

## 2015-09-30 LAB — PROCALCITONIN: Procalcitonin: 0.1 ng/mL

## 2015-09-30 LAB — GLUCOSE, CAPILLARY
GLUCOSE-CAPILLARY: 205 mg/dL — AB (ref 65–99)
Glucose-Capillary: 155 mg/dL — ABNORMAL HIGH (ref 65–99)
Glucose-Capillary: 156 mg/dL — ABNORMAL HIGH (ref 65–99)
Glucose-Capillary: 156 mg/dL — ABNORMAL HIGH (ref 65–99)
Glucose-Capillary: 159 mg/dL — ABNORMAL HIGH (ref 65–99)
Glucose-Capillary: 191 mg/dL — ABNORMAL HIGH (ref 65–99)
Glucose-Capillary: 192 mg/dL — ABNORMAL HIGH (ref 65–99)

## 2015-09-30 LAB — URINE MICROSCOPIC-ADD ON

## 2015-09-30 LAB — PHOSPHORUS: Phosphorus: 4.5 mg/dL (ref 2.5–4.6)

## 2015-09-30 LAB — MAGNESIUM: MAGNESIUM: 1.8 mg/dL (ref 1.7–2.4)

## 2015-09-30 NOTE — Progress Notes (Signed)
Referring Physician(s): Dr Roda Shutters  Chief Complaint:  CVA; LMCA revascularization- clot retrieval 2/2  Subjective:  Vent; no response stable  Allergies: Review of patient's allergies indicates no known allergies.  Medications: Prior to Admission medications   Medication Sig Start Date End Date Taking? Authorizing Provider  acetaminophen (TYLENOL) 500 MG tablet Take 500 mg by mouth every 8 (eight) hours as needed. pain   Yes Historical Provider, MD  albuterol (PROVENTIL HFA;VENTOLIN HFA) 108 (90 Base) MCG/ACT inhaler Inhale 1 puff into the lungs every 6 (six) hours as needed for wheezing or shortness of breath.   Yes Historical Provider, MD  amitriptyline (ELAVIL) 50 MG tablet Take 75 mg by mouth at bedtime.   Yes Historical Provider, MD  amLODipine (NORVASC) 10 MG tablet Take 10 mg by mouth daily.   Yes Historical Provider, MD  atorvastatin (LIPITOR) 40 MG tablet Take 80 mg by mouth daily.   Yes Historical Provider, MD  citalopram (CELEXA) 20 MG tablet Take 20 mg by mouth daily.   Yes Historical Provider, MD  clonazePAM (KLONOPIN) 0.5 MG tablet Take 0.5 mg by mouth 2 (two) times daily as needed. For anxiety   Yes Historical Provider, MD  ergocalciferol (VITAMIN D2) 50000 units capsule Take 50,000 Units by mouth once a week.   Yes Historical Provider, MD  Insulin Glargine (LANTUS SOLOSTAR) 100 UNIT/ML Solostar Pen Inject 60 Units into the skin every evening.   Yes Historical Provider, MD  lisinopril-hydrochlorothiazide (PRINZIDE,ZESTORETIC) 20-12.5 MG tablet Take 1 tablet by mouth daily.   Yes Historical Provider, MD  pantoprazole (PROTONIX) 40 MG tablet Take 40 mg by mouth daily. Reported on 09/28/2015   Yes Historical Provider, MD     Vital Signs: BP 155/77 mmHg  Pulse 125  Temp(Src) 100.8 F (38.2 C) (Axillary)  Resp 20  Ht 5\' 7"  (1.702 m)  Wt 238 lb 12.1 oz (108.3 kg)  BMI 37.39 kg/m2  SpO2 100%  Physical Exam  Abdominal: Soft.  Skin: Skin is warm and dry.  Rt groin  site clean and dry No bleeding no hematoma Rt foot 2+ pulses  Nursing note and vitals reviewed.   Imaging: Ct Angio Head W/cm &/or Wo Cm  09/28/2015  CLINICAL DATA:  Stroke-like symptoms after cocaine use. History of hepatitis-C, renal failure, hypertension, stroke. EXAM: CT ANGIOGRAPHY HEAD AND NECK TECHNIQUE: Multidetector CT imaging of the head and neck was performed using the standard protocol during bolus administration of intravenous contrast. Multiplanar CT image reconstructions and MIPs were obtained to evaluate the vascular anatomy. Carotid stenosis measurements (when applicable) are obtained utilizing NASCET criteria, using the distal internal carotid diameter as the denominator. CONTRAST:  80mL OMNIPAQUE IOHEXOL 350 MG/ML SOLN COMPARISON:  CT head September 28, 2015 at 0124 hours and MRI/MRA head June 01, 2011 FINDINGS: Moderately motion degraded examination. CTA NECK Aortic arch: Normal appearance of the thoracic arch, normal branch pattern. Motion obscures the origins of the arch vessels. Moderate calcific atherosclerosis of the aortic arch. Arch vessels appear patent. Right carotid system: Common carotid artery is widely patent, coursing in a straight line fashion. Normal appearance of the carotid bifurcation without hemodynamically significant stenosis by NASCET criteria. Mild eccentric calcific atherosclerosis. Normal appearance of the included internal carotid artery. Left carotid system: Contrast reflux into LEFT internal jugular vein limits evaluation of the LEFT Common carotid artery due to streak artifact. Common carotid artery is widely patent, coursing in a straight line fashion. Normal appearance of the carotid bifurcation without hemodynamically significant stenosis by  NASCET criteria. Normal appearance of the included internal carotid artery. Vertebral arteries:Origins of the vertebral arteries obscured by patient motion. Normal appearance of the vertebral arteries, which appear  widely patent. Skeleton: No acute osseous process though bone windows have not been submitted. Multiple absent teeth. Other neck: Soft tissues of the neck are nonacute though, not tailored for evaluation. 3.5 x 5.7 cm dominant LEFT thyroid nodule mildly displacing and narrowing the trachea to the RIGHT. CTA HEAD Anterior circulation: Normal appearance of the cervical internal carotid arteries, petrous, cavernous and supra clinoid internal carotid arteries. Moderate calcific atherosclerosis the carotid siphons. Widely patent anterior communicating artery. LEFT M2 occlusion, density within LEFT sylvian fissure on today's examination likely represent is thromboembolism. Normal appearance of the anterior cerebral arteries. Posterior circulation: Normal appearance of the vertebral arteries, vertebrobasilar junction and basilar artery, as well as main branch vessels. Robust bilateral posterior communicating arteries. Normal appearance of the posterior cerebral arteries. No hemodynamically significant stenosis, dissection, contrast extravasation or aneurysm within the anterior nor posterior circulation. Mild general luminal irregularity of the intracranial vessels. IMPRESSION: CTA NECK: Moderately motion degraded examination, limited by LEFT venous contamination. No definite acute vascular process or hemodynamically significant stenosis. 3.5 x 5.7 cm dominant LEFT thyroid not oral for which follow up thyroid sonogram is recommended on a nonemergent basis. CTA HEAD: LEFT M2 segment occlusion corresponding to sylvian fissure density on today's CT, likely acute. Complete circle of Willis.  Mild intracranial atherosclerosis. Acute findings discussed with and reconfirmed by Dr.CHARLES STEWART on 09/28/2015 at 4:05 am. Electronically Signed   By: Awilda Metro M.D.   On: 09/28/2015 04:08   Ct Head Wo Contrast  09/29/2015  CLINICAL DATA:  Followup LEFT M2 occlusion treated endovascularly. EXAM: CT HEAD WITHOUT CONTRAST  TECHNIQUE: Contiguous axial images were obtained from the base of the skull through the vertex without intravenous contrast. COMPARISON:  MRI of the head September 28, 2015 and CT head September 28, 2015 FINDINGS: Large wedge-like hypodensities LEFT frontal, temporal, parietal and occipital lobes and same distribution as reduced diffusion on yesterday's MRI. Faint density within LEFT posterior frontal lobe residual contrast staining without lobar hematoma. Multiple tiny densities LEFT frontal LEFT frontal lobe favoring residual intravascular contrast, unchanged from recent CT. Worsening mass-effect with 3 mm LEFT-to-RIGHT midline shift, new effacement of the LEFT atrium without ventricular entrapment. Small bilateral basal ganglia lacunar infarcts. Patchy to confluent white matter changes compatible with chronic small vessel ischemic disease. No abnormal extra-axial fluid collection. Basal cisterns are patent. Moderate calcific atherosclerosis the carotid siphons. Mild paranasal sinus mucosal thickening. Mastoid air cells are well aerated. Ocular globes and orbital contents are normal. No skull fracture. IMPRESSION: Evolving large LEFT MCA territory infarct with small amount of residual contrast staining, no hemorrhagic conversion. Increasing edema and mass effect with 3 mm LEFT-to-RIGHT midline shift, no ventricular entrapment. Old bilateral basal ganglia lacunar infarcts. Moderate to severe chronic small vessel ischemic disease. Electronically Signed   By: Awilda Metro M.D.   On: 09/29/2015 05:44   Ct Head Wo Contrast  09/28/2015  CLINICAL DATA:  60 year old female with left M2 occlusion treated endovascularly. Follow-up. Subsequent encounter. EXAM: CT HEAD WITHOUT CONTRAST TECHNIQUE: Contiguous axial images were obtained from the base of the skull through the vertex without intravenous contrast. COMPARISON:  09/28/2015. FINDINGS: Now well delineated is left middle cerebral artery moderate size infarct (best  noted superiorly). Gyriform enhancement left temporal-parietal lobe may reflect result of recent angiogram indicating region at risk for infarct/ischemia. Hyperdense material within  the left sylvian fissure may represent combination of blood and/ or contrast. No hydrocephalus or midline shift. Moderate chronic small vessel disease type changes. Remote basal ganglia infarcts bilaterally. Remote right paracentral pontine infarct. Mucosal thickening ethmoid sinus air cells. IMPRESSION: Now well delineated is left middle cerebral artery moderate size infarct (best noted superiorly). Gyriform enhancement left temporal-parietal lobe may reflect result of recent angiogram indicating region at risk for infarct/ischemia. Hyperdense material within the left sylvian fissure may represent combination of blood and/ or contrast. Moderate chronic small vessel disease type changes. Remote basal ganglia infarcts bilaterally. Remote right paracentral pontine infarct. These results will be called to the ordering clinician or representative by the Radiologist Assistant, and communication documented in the PACS or zVision Dashboard. Electronically Signed   By: Lacy Duverney M.D.   On: 09/28/2015 07:42   Ct Head Wo Contrast  09/28/2015  CLINICAL DATA:  Right facial droop.  Code stroke. EXAM: CT HEAD WITHOUT CONTRAST TECHNIQUE: Contiguous axial images were obtained from the base of the skull through the vertex without intravenous contrast. COMPARISON:  10/27/2014 FINDINGS: Skull and Sinuses:Negative for fracture or destructive process. No sinusitis or mastoiditis Visualized orbits: Nonspecific dysconjugate gaze. Brain: Extensive chronic small vessel disease with ischemic gliosis throughout the deep cerebral white matter and chronic (based on prior) lacunar infarcts in the bilateral thalamus and deep white matter tracts. Chronic patchy ischemic change also seen in the pons, greater on the right. There is new calcific density in the mid  left sylvian fissure, in line with a MCA branch, possible calcific embolus. No evidence of cortical infarct. Aspects is 10. No hemorrhage, hydrocephalus, or shift. Critical Value/emergent results were called by telephone at the time of interpretation on 09/28/2015 at 1:42 am to Dr. Loleta Rose , who verbally acknowledged these results. IMPRESSION: 1. New calcific density in the left sylvian fissure, possible calcific distal M2 embolus. No visible acute infarct or hemorrhage. 2. Advanced chronic small vessel disease with multiple lacunar infarcts. Electronically Signed   By: Marnee Spring M.D.   On: 09/28/2015 01:49   Ct Angio Neck W/cm &/or Wo/cm  09/28/2015  CLINICAL DATA:  Stroke-like symptoms after cocaine use. History of hepatitis-C, renal failure, hypertension, stroke. EXAM: CT ANGIOGRAPHY HEAD AND NECK TECHNIQUE: Multidetector CT imaging of the head and neck was performed using the standard protocol during bolus administration of intravenous contrast. Multiplanar CT image reconstructions and MIPs were obtained to evaluate the vascular anatomy. Carotid stenosis measurements (when applicable) are obtained utilizing NASCET criteria, using the distal internal carotid diameter as the denominator. CONTRAST:  80mL OMNIPAQUE IOHEXOL 350 MG/ML SOLN COMPARISON:  CT head September 28, 2015 at 0124 hours and MRI/MRA head June 01, 2011 FINDINGS: Moderately motion degraded examination. CTA NECK Aortic arch: Normal appearance of the thoracic arch, normal branch pattern. Motion obscures the origins of the arch vessels. Moderate calcific atherosclerosis of the aortic arch. Arch vessels appear patent. Right carotid system: Common carotid artery is widely patent, coursing in a straight line fashion. Normal appearance of the carotid bifurcation without hemodynamically significant stenosis by NASCET criteria. Mild eccentric calcific atherosclerosis. Normal appearance of the included internal carotid artery. Left carotid system:  Contrast reflux into LEFT internal jugular vein limits evaluation of the LEFT Common carotid artery due to streak artifact. Common carotid artery is widely patent, coursing in a straight line fashion. Normal appearance of the carotid bifurcation without hemodynamically significant stenosis by NASCET criteria. Normal appearance of the included internal carotid artery. Vertebral arteries:Origins of the  vertebral arteries obscured by patient motion. Normal appearance of the vertebral arteries, which appear widely patent. Skeleton: No acute osseous process though bone windows have not been submitted. Multiple absent teeth. Other neck: Soft tissues of the neck are nonacute though, not tailored for evaluation. 3.5 x 5.7 cm dominant LEFT thyroid nodule mildly displacing and narrowing the trachea to the RIGHT. CTA HEAD Anterior circulation: Normal appearance of the cervical internal carotid arteries, petrous, cavernous and supra clinoid internal carotid arteries. Moderate calcific atherosclerosis the carotid siphons. Widely patent anterior communicating artery. LEFT M2 occlusion, density within LEFT sylvian fissure on today's examination likely represent is thromboembolism. Normal appearance of the anterior cerebral arteries. Posterior circulation: Normal appearance of the vertebral arteries, vertebrobasilar junction and basilar artery, as well as main branch vessels. Robust bilateral posterior communicating arteries. Normal appearance of the posterior cerebral arteries. No hemodynamically significant stenosis, dissection, contrast extravasation or aneurysm within the anterior nor posterior circulation. Mild general luminal irregularity of the intracranial vessels. IMPRESSION: CTA NECK: Moderately motion degraded examination, limited by LEFT venous contamination. No definite acute vascular process or hemodynamically significant stenosis. 3.5 x 5.7 cm dominant LEFT thyroid not oral for which follow up thyroid sonogram is  recommended on a nonemergent basis. CTA HEAD: LEFT M2 segment occlusion corresponding to sylvian fissure density on today's CT, likely acute. Complete circle of Willis.  Mild intracranial atherosclerosis. Acute findings discussed with and reconfirmed by Dr.CHARLES STEWART on 09/28/2015 at 4:05 am. Electronically Signed   By: Awilda Metro M.D.   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 Subsequent encounter. EXAM: MRI HEAD WITHOUT CONTRAST TECHNIQUE: Multiplanar, multiecho pulse sequences of the brain and surrounding structures were obtained without intravenous contrast. COMPARISON:  09/28/2015 head CT. FINDINGS: Large acute hemorrhagic left hemispheric infarct involves the majority of the left middle cerebral artery distribution (left frontal lobe, left parietal lobe, left temporal lobe, left corona radiata and posterior left operculum region). Hemorrhage is most notable involving the left posterior temporal -parietal aspect of this acute infarct. Local mass effect upon the left lateral ventricle. At most there is minimal bowing of the septum to the right. Several small acute nonhemorrhagic right hemispheric infarcts involving right caudate head, superior aspect of the post limb right internal capsule, right frontal lobe, superior right lenticular nucleus and right occipital lobe. Several small acute nonhemorrhagic cerebellar infarcts greater on the right. Remote left lenticular nucleus infarct or hematoma with blood-stained cleft now noted. Moderate to marked chronic small vessel disease changes. Atrophy without hydrocephalus. No intracranial mass lesion noted on this unenhanced exam. Scattered blood breakdown products right cerebellum and pons consistent with prior episodes hemorrhagic ischemia. Major intracranial  vascular structures are patent. Slightly heterogeneous bone marrow of the clivus and upper cervical spine without discrete mass identified. Stability can be confirmed on follow-up. Mild exophthalmos.  Cervical medullary junction unremarkable. IMPRESSION: Large acute hemorrhagic left hemispheric infarct involves the majority of the left middle cerebral artery distribution. Hemorrhage is most notable involving the left posterior temporal -parietal aspect of this acute infarct. Local mass effect upon the left lateral ventricle. At most there is minimal bowing of the septum to the right. Several small acute nonhemorrhagic right hemispheric infarcts involving right caudate head, superior aspect of the post limb right internal capsule, right frontal lobe, superior right lenticular nucleus and right occipital lobe. Several small  acute nonhemorrhagic cerebellar infarcts greater on the right. Remote left lenticular nucleus infarct or hematoma with blood-stained cleft now noted. Moderate to marked chronic small vessel disease changes. Atrophy. Electronically Signed   By: Lacy Duverney M.D.   On: 09/28/2015 13:16   Dg Chest Port 1 View  09/29/2015  CLINICAL DATA:  Respiratory failure EXAM: PORTABLE CHEST 1 VIEW COMPARISON:  09/28/2015 FINDINGS: Endotracheal tube terminates 1.5 cm above the carina. Mild patchy opacity at the lung bases, likely atelectasis. No focal consolidation. No pleural effusion or pneumothorax. The heart is normal in size. Enteric tube courses into the stomach. IMPRESSION: Endotracheal tube terminates 1.5 cm above the carina. Electronically Signed   By: Charline Bills M.D.   On: 09/29/2015 10:45   Dg Chest Portable 1 View  09/28/2015  CLINICAL DATA:  Intubation EXAM: PORTABLE CHEST 1 VIEW COMPARISON:  10/27/2014 FINDINGS: High endotracheal tube with tip at the thoracic inlet and near the glottis. Low lung volumes with diffuse increase in interstitial opacity. Mild cardiomegaly. Aortic tortuosity  accentuated by volumes and rotation. These results were called by telephone at the time of interpretation on 09/28/2015 at 4:18 am to RN Shanda Bumps, who verbally acknowledged these results. IMPRESSION: 1. High endotracheal tube with balloon near the glottis. Recommend advancement by 3 to 4 cm. 2. Diffuse increase in lung opacity which may be from low volumes and atelectasis or aspiration. Electronically Signed   By: Marnee Spring M.D.   On: 09/28/2015 04:20   Dg Abd Portable 1v  09/29/2015  CLINICAL DATA:  Orogastric tube placement. EXAM: PORTABLE ABDOMEN - 1 VIEW COMPARISON:  Abdominal radiograph of June 04, 2011 FINDINGS: In orogastric tube is in place with the tip in the region of the pylorus or proximal duodenum. The stomach does not appear distended with gas. The small and large bowel gas pattern is normal. The lung bases are clear. IMPRESSION: The orogastric tube tip lies in the distal stomach or duodenal bulb. Electronically Signed   By: David  [place] M.D.   On: 09/29/2015 07:41    Labs:  CBC:  Recent Labs  09/28/15 0112 09/28/15 0938 09/29/15 0420 09/30/15 0350  WBC 18.7* 8.6 11.3* 13.0*  HGB 12.0 13.0 11.3* 11.2*  HCT 35.8 39.0 34.5* 34.9*  PLT 276 PLATELET CLUMPS NOTED ON SMEAR, UNABLE TO ESTIMATE 288 PLATELET CLUMPS NOTED ON SMEAR, UNABLE TO ESTIMATE    COAGS:  Recent Labs  09/28/15 0112  INR 0.98  APTT 27    BMP:  Recent Labs  09/28/15 0112 09/29/15 0420 09/30/15 0350  NA 142 142 142  K 4.4 4.5 5.2*  CL 110 110 109  CO2 26 24 21*  GLUCOSE 157* 144* 166*  BUN 26* 20 25*  CALCIUM 9.1 8.5* 8.8*  CREATININE 1.55* 1.43* 1.28*  GFRNONAA 35* 39* 45*  GFRAA 41* 45* 52*    LIVER FUNCTION TESTS:  Recent Labs  09/28/15 0112  BILITOT 0.4  AST 26  ALT 16  ALKPHOS 94  PROT 7.6  ALBUMIN 4.1    Assessment and Plan:  CVA; L MCA revascularization 2/2 On vent  Electronically Signed: Yomira Flitton A 09/30/2015, 8:23 AM   I spent a total of 15 Minutes at  the the patient's bedside AND on the patient's hospital floor or unit, greater than 50% of which was counseling/coordinating care for L MCA revascularization

## 2015-09-30 NOTE — Progress Notes (Signed)
VASCULAR LAB PRELIMINARY  PRELIMINARY  PRELIMINARY  PRELIMINARY  Bilateral lower extremity venous duplex  completed.    Preliminary report:  Right:  No evidence of DVT  superficial thrombosis in the visualized vein of the right lower extremity.  Unable to image right groin due to pressure dressing.  No Baker's cyst.   Left:  No evidence of DVT, superficial thrombosis, or Baker's cyst.  Zlatan Hornback, RVT 09/30/2015, 11:04 AM

## 2015-09-30 NOTE — Progress Notes (Signed)
PT Cancellation Note  Patient Details Name: Linda [name] MRN: [id number] DOB: 1956-07-12   Cancelled Treatment:    Reason Eval/Treat Not Completed: Patient not medically ready, remains on ventilator support   Fabio Asa 09/30/2015, 6:41 AM Charlotte Crumb, PT DPT  [phone]

## 2015-09-30 NOTE — Progress Notes (Signed)
STROKE TEAM PROGRESS NOTE   SUBJECTIVE (INTERVAL HISTORY) No family is at the bedside. Currently versed drip on hold. Patient had no events overnight.  Remains intubated and currently undergoing weaning parameters per RT.  She does not follow command and appears sedated despite Versed on hold  OBJECTIVE Temp:  [99.5 F (37.5 C)-101.3 F (38.5 C)] 99.5 F (37.5 C) (02/04 0400) Pulse Rate:  [107-126] 115 (02/04 0600) Cardiac Rhythm:  [-] Sinus tachycardia (02/03 2000) Resp:  [0-30] 20 (02/04 0600) BP: (92-156)/(56-101) 154/81 mmHg (02/04 0600) SpO2:  [100 %] 100 % (02/04 0600) Arterial Line BP: (71-169)/(53-112) 102/93 mmHg (02/04 0600) FiO2 (%):  [40 %] 40 % (02/04 0308) Weight:  [108.3 kg (238 lb 12.1 oz)] 108.3 kg (238 lb 12.1 oz) (02/04 0500)  CBC:   Recent Labs Lab 09/28/15 0112  09/29/15 0420 09/30/15 0350  WBC 18.7*  < > 11.3* 13.0*  NEUTROABS 2.6  --  6.1  --   HGB 12.0  < > 11.3* 11.2*  HCT 35.8  < > 34.5* 34.9*  MCV 92.5  < > 93.0 95.1  PLT 276  < > 288 PLATELET CLUMPS NOTED ON SMEAR, UNABLE TO ESTIMATE  < > = values in this interval not displayed.  Basic Metabolic Panel:   Recent Labs Lab 09/29/15 0420 09/30/15 0350  NA 142 142  K 4.5 5.2*  CL 110 109  CO2 24 21*  GLUCOSE 144* 166*  BUN 20 25*  CREATININE 1.43* 1.28*  CALCIUM 8.5* 8.8*  MG 1.8 1.8  PHOS 4.6 4.5    Lipid Panel:     Component Value Date/Time   CHOL 170 09/28/2015 0938   TRIG 497* 09/28/2015 0938   TRIG 503* 09/28/2015 0938   HDL 38* 09/28/2015 0938   CHOLHDL 4.5 09/28/2015 0938   VLDL UNABLE TO CALCULATE IF TRIGLYCERIDE OVER 400 mg/dL 11/91/4782 9562   LDLCALC UNABLE TO CALCULATE IF TRIGLYCERIDE OVER 400 mg/dL 13/03/6577 4696   EXBM8U:  Lab Results  Component Value Date   HGBA1C 6.9* 09/28/2015   Urine Drug Screen:     Component Value Date/Time   LABOPIA NONE DETECTED 09/28/2015 0226   LABBENZ NONE DETECTED 09/28/2015 0226   AMPHETMU NONE DETECTED 09/28/2015 0226   THCU  NONE DETECTED 09/28/2015 0226   LABBARB NONE DETECTED 09/28/2015 0226      IMAGING I have personally reviewed the radiological images below and agree with the radiology interpretations.  Ct Head Wo Contrast 09/28/2015  1. New calcific density in the left sylvian fissure, possible calcific distal M2 embolus. No visible acute infarct or hemorrhage. 2. Advanced chronic small vessel disease with multiple lacunar infarcts.   CTA NECK 09/28/2015   Moderately motion degraded examination, limited by LEFT venous contamination. No definite acute vascular process or hemodynamically significant stenosis. 3.5 x 5.7 cm dominant LEFT thyroid not oral for which follow up thyroid sonogram is recommended on a nonemergent basis.   CTA HEAD 09/28/2015   LEFT M2 segment occlusion corresponding to sylvian fissure density on today's CT, likely acute. Complete circle of Willis.  Mild intracranial atherosclerosis. Moderate calcific atherosclerosis the carotid siphons.   Cerebral Angiogram 09/28/2015 S/P Lt internal carotid arteriogram followed by complete revascularization of occluded dominant inferior division of LT MCA with x pass with trevoprovue 4 mmx 30 mm retrieval device and 6 mg of superselective Integrelin achieving a TICI 3 flow.  MRI  Large acute hemorrhagic left hemispheric infarct involves the majority of the left middle cerebral artery distribution. Hemorrhage  is most notable involving the left posterior temporal -parietal aspect of this acute infarct. Local mass effect upon the left lateral ventricle. At most there is minimal bowing of the septum to the right. Several small acute nonhemorrhagic right hemispheric infarcts involving right caudate head, superior aspect of the post limb right internal capsule, right frontal lobe, superior right lenticular nucleus and right occipital lobe. Several small acute nonhemorrhagic cerebellar infarcts greater on the right. Remote left lenticular nucleus  infarct or hematoma with blood-stained cleft now noted. Moderate to marked chronic small vessel disease changes. Atrophy.  EEG Abnormalities: 1) possible attenuation of sleep structures in the left central region 2) lack of waking state Clinical Interpretation: This EEG is consistent with a sleep/sedation EEG with a possible suggestion of a left central cerebral dysfunction, though I don't think this is definite by this study.  2D echo - Systolic function was normal. The estimated ejection fraction was in the range of 55% to 60%. Wall motion was normal; there were no regional wall motion abnormalities. Doppler parameters are consistent with abnormal left ventricular relaxation (grade 1 diastolic dysfunction).  LE venous doppler - pending    Physical exam  Temp:  [99.5 F (37.5 C)-101.3 F (38.5 C)] 99.5 F (37.5 C) (02/04 0400) Pulse Rate:  [107-126] 115 (02/04 0600) Resp:  [0-30] 20 (02/04 0600) BP: (92-156)/(56-101) 154/81 mmHg (02/04 0600) SpO2:  [100 %] 100 % (02/04 0600) Arterial Line BP: (71-169)/(53-112) 102/93 mmHg (02/04 0600) FiO2 (%):  [40 %] 40 % (02/04 0308) Weight:  [108.3 kg (238 lb 12.1 oz)] 108.3 kg (238 lb 12.1 oz) (02/04 0500)  General - obese, well developed, intubated; versed on hold HEENT:  NCAT; sclera slightly icteric; copious oral secretions Cardiovascular - Regular rate and rhythm. Pulm:  Scattered rhonchi Abd:  Obese; ND, normal bowel sounds Ext:  No C/C/E  Neurologic Exam MENTAL STATUS: Intubated with versed on hold Not responsive to voice, does not open eyes.  Pupils 3mm, poorly reactive to light, slight doll's eye with head movement, weak corneal and strong gag reflexes, breathing over the vent.   MOTOR/SENSORY: On pain stimulation, she has weak withdrawal of right UE and no response in the right LE; she spontaneously moves the left UE and LE  Coordination or gait are not able to test.   ASSESSMENT/PLAN Linda [name] is  a 60 y.o. female with history of left BG ICH in 2012, hypertension, cocaine abuse, renal failure and hepatitis C presenting with altered mental status, right side weakness and leftward gaze. Also concerning for possible seizure activity initial presentation. She did not receive IV t-PA due to hx ICH, CTA showed a L M2 occlusion, she was sent to IR where she received complete TICI3 revascularization of the L MCA with mechanical thrombectomy with trevo and IA Integrilin.  Stroke:  left MCA infarct with hemorrhagic transformation s/p revascularizationf of L M2, multifocal scattered infarcts involving bilateral posterior and anterior circulation, consistent with cardioembolic source although pt does have significant large vessel intracranial atherosclerosis.  Resultant  Intubated and unresponsive  MRI - multifocal scattered infarcts involving bilateral posterior and anterior circulation, largest at left MCA territory with patchy hemorrhagic transformation on MRI but not on CT  Repeat CT - left MCA hemorrhagic infarct   CTA head L M2 occlusion, b/l ICA supraclinoid segment significant atherosclerosis, L>R with moderate to severe stenosis.  CTA neck L thyroid nodule  2D Echo  Results above  LE venous doppler pending  LDL unable to calculate  due to high TG   HgbA1c 6.9  UDS positive for cocaine  Heparin subq for VTE prophylaxis Diet NPO time specified  No antithrombotic prior to admission, now on ASA .   Ongoing aggressive stroke risk factor management  Therapy recommendations:  pending   Disposition:  pending   ? Seizure  Questionable seizure presentation initially as reported in chart  EEG showed no seizure but under sedation  Hold off AEDs at this time  on versed drip  Hx of ICH  2012 left BG ICH; during hospital course:  BP high at 200s  Urine positive with cocaine   S/p trach and PEG at that time  Lost follow up since  Acute respiratory failure  Intubated  in ED due to airway protection  On versed with ventilation  CCM following  Hypertensive Emergency  BP as high as 202/124  off cardene drip  Stable now with tight parameters post IR < 160  Hyperlipidemia  Home meds:  No statin  LDL not able to calculate due to high TG , goal < 70  Total chol 170  Consider statin once po access.  Cocaine abuse  urine positive both in 2012 and this admission  Likely one of the causes of intracranial vascular stenosis  Need cessation counseling   Other Stroke Risk Factors  Obesity  Other Active Problems  Hepatitis C  Hx Dysphagia secondary to ICH in 2012 with PEG  Leukocytosis and fever:  Fever Tm 101.3; WBCs 18.7->11.3-> 13  Hospital day # 2  This patient is critically ill due to large left MCA infarct with left M2 occlusion s/p intervention, hemorrhagic transformation and at significant risk of neurological worsening, death form recurrent infarcts, increased ICH, brain herniation, cerebral edema, heart failure. This patient's care requires constant monitoring of vital signs, hemodynamics, respiratory and cardiac monitoring, review of multiple databases, neurological assessment, discussion with family, other specialists and medical decision making of high complexity. I spent 40 minutes of neurocritical care time in the care of this patient.    Repeat cultures due to fever  CXR  Monitor WBCs  Dr. Sula Soda Stroke Neurology 09/30/2015 7:45 AM      To contact Stroke Continuity provider, please refer to [email or web address]. After hours, contact General Neurology

## 2015-09-30 NOTE — Progress Notes (Signed)
PULMONARY / CRITICAL CARE MEDICINE   Name: Linda [name] MRN: [id number] DOB: 1955/09/28    ADMISSION DATE:  09/28/2015 CONSULTATION DATE:  2/2  REFERRING MD:  Neuro  CHIEF COMPLAINT:  Obtunded   HISTORY OF PRESENT ILLNESS:   60 yo AAF with hx of stroke 2012 that required trach/peg and with residual right side weakness was seen at [facility] after falling off a couch. Code stroke called per EDP and transferred to [facility] for IR procedure for revascularization of of left MCA. She was then admitted to NSICU and PCCM asked to help manage vent and critical care issues. She remains intubated and sedated on arrival to ICU. Goals of extubation will be clarified with neuro.   SUBJECTIVE:  Remains on full vent support. Failed awakening and weaning trials. Blood pressure stable; off Cardene gtt.   VITAL SIGNS: BP 155/77 mmHg  Pulse 125  Temp(Src) 100.8 F (38.2 C) (Axillary)  Resp 20  Ht  (1.702 m)  Wt 108.3 kg (238 lb 12.1 oz)  BMI 37.39 kg/m2  SpO2 100%  HEMODYNAMICS:    VENTILATOR SETTINGS: Vent Mode:  [-] PSV;CPAP FiO2 (%):  [40 %] 40 % Set Rate:  [20 bmp] 20 bmp Vt Set:  [490 mL] 490 mL PEEP:  [5 cmH20] 5 cmH20 Pressure Support:  [8 cmH20] 8 cmH20 Plateau Pressure:  [13 cmH20-17 cmH20] 13 cmH20  INTAKE / OUTPUT: I/O last 3 completed shifts: In: 3364.2 [I.V.:2948.5; NG/GT:415.8] Out: 2250 [Urine:2250]  PHYSICAL EXAMINATION: General:  Intubated and sedated Neuro: Withdraws to noxious stimulus HEENT:  ETT-> vent, short neck, pupils pinpoint, unreactive Cardiovascular: ST at 106; S1/S2, no MRG Lungs: Bilateral airflow Abdomen:  Obese, normal BS, S/NT/ND Musculoskeletal: No joint deformities Skin: warm and dry  LABS:  BMET  Recent Labs Lab 09/28/15 0112 09/29/15 0420 09/30/15 0350  NA 142 142 142  K 4.4 4.5 5.2*  CL 110 110 109  CO2 26 24 21*  BUN 26* 20 25*  CREATININE 1.55* 1.43* 1.28*  GLUCOSE 157* 144* 166*    Electrolytes  Recent Labs Lab  09/28/15 0112 09/29/15 0420 09/30/15 0350  CALCIUM 9.1 8.5* 8.8*  MG  --  1.8 1.8  PHOS  --  4.6 4.5    CBC  Recent Labs Lab 09/28/15 0938 09/29/15 0420 09/30/15 0350  WBC 8.6 11.3* 13.0*  HGB 13.0 11.3* 11.2*  HCT 39.0 34.5* 34.9*  PLT PLATELET CLUMPS NOTED ON SMEAR, UNABLE TO ESTIMATE 288 PLATELET CLUMPS NOTED ON SMEAR, UNABLE TO ESTIMATE    Coag's  Recent Labs Lab 09/28/15 0112  APTT 27  INR 0.98    Sepsis Markers  Recent Labs Lab 09/28/15 0938 09/29/15 0420 09/30/15 0350  LATICACIDVEN 1.5  --   --   PROCALCITON <0.10 0.10 0.10    ABG  Recent Labs Lab 09/28/15 0436 09/28/15 0805  PHART 7.255* 7.364  PCO2ART 60.3* 40.1  PO2ART 180.0* 304*    Liver Enzymes  Recent Labs Lab 09/28/15 0112  AST 26  ALT 16  ALKPHOS 94  BILITOT 0.4  ALBUMIN 4.1    Cardiac Enzymes  Recent Labs Lab 09/28/15 0112  TROPONINI <0.03    Glucose  Recent Labs Lab 09/29/15 0820 09/29/15 1155 09/29/15 1533 09/29/15 1959 09/29/15 2349 09/30/15 0343  GLUCAP 94 111* 115* 103* 156* 156*    Imaging Dg Chest Port 1 View  09/29/2015  CLINICAL DATA:  Respiratory failure EXAM: PORTABLE CHEST 1 VIEW COMPARISON:  09/28/2015 FINDINGS: Endotracheal tube terminates 1.5 cm above the  carina. Mild patchy opacity at the lung bases, likely atelectasis. No focal consolidation. No pleural effusion or pneumothorax. The heart is normal in size. Enteric tube courses into the stomach. IMPRESSION: Endotracheal tube terminates 1.5 cm above the carina. Electronically Signed   By: Charline Bills M.D.   On: 09/29/2015 10:45    STUDIES:  Echo 02/20>> EEG 02/20: EEG is consistent with a sleep/sedation; possible suggestion of a left central cerebral dysfunction  CULTURES: 2/2 bc x 2>> 2/2 uc>> 2/2 sputum>>GRAM POSITIVE COCCI  IN PAIRS, IN CHAINS, IN CLUSTERS  AND FEW GRAM NEGATIVE RODS   ANTIBIOTICS: None  SIGNIFICANT EVENTS: Code stroke 2/2  LINES/TUBES: 2/2 OTT>> 2/2 rt  femoral sheath>> 2/3 left rad a line>>  DISCUSSION: 60 yo AAF hx of stroke and new neurological event + cocaine. Now s/p interventional procedure for L MCA occlusion.   ASSESSMENT / PLAN:  PULMONARY A: VDRF post IR procedure on 2/2 P:   Begin PS trials but no extubation given mental status. Titrate O2 for sat of 88-92%.  CARDIOVASCULAR A:  Hx of HTN, exacerbated by cocaine use.  P:  Off cardene GTT. BP parameters per neurology. Hydralazine prn to maintain SBP<160. Echocardiogram 55-60%.  RENAL Lab Results  Component Value Date   CREATININE 1.28* 09/30/2015   CREATININE 1.43* 09/29/2015   CREATININE 1.55* 09/28/2015   CREATININE 1.27 02/28/2014   CREATININE 1.15 04/10/2012   CREATININE 1.32* 11/13/2011   A:   ARI, likely related to HTN, volume status and dye load for procedure; slight improvement.  P:   Continue IV FLUIDS Follow BMP  GASTROINTESTINAL A:   Hx of  Dysphagia with Peg in 2012 P:   PPI TF per nutrition.  HEMATOLOGIC A:   No acute issues P:  PAS  INFECTIOUS A:   No immediate concern for infection P:   Monitor infection parameters   ENDOCRINE A:   Hyperglycemia without h/o diabetes P:   SSI and Blood glucose monitoring per ICU protocol  NEUROLOGIC A:   Post Left MCA recannulization of inferior division 2/2 Hx of hemorraghic stroke 2012 with residual rt side weakness. Required trach/peg in 2012 Substance abuse/ + for cocaine    P:   RASS goal: 0 Sedation for vent tolerance until no further procedures Follow neuro exam and Neurology recs s/p new CVA.  PT/OT/Speech recs per neurology   FAMILY  - Updates: no family at bedside.  - Inter-disciplinary family meet or Palliative Care meeting due by: 10/05/15  The patient is critically ill with multiple organ systems failure and requires high complexity decision making for assessment and support, frequent evaluation and titration of therapies, application of advanced monitoring  technologies and extensive interpretation of multiple databases.   Critical Care Time devoted to patient care services described in this note is  35  Minutes. This time reflects time of care of this signee Dr Koren Bound. This critical care time does not reflect procedure time, or teaching time or supervisory time of PA/NP/Med student/Med Resident etc but could involve care discussion time.  Alyson Reedy, M.D. [facility] Pulmonary/Critical Care Medicine. Pager: [phone]. After hours pager: [phone].  09/30/2015, 8:40 AM

## 2015-09-30 NOTE — Progress Notes (Signed)
SLP Cancellation Note  Patient Details Name: Linda [name] MRN: [id number] DOB: 12/29/1955   Cancelled treatment:       Reason Eval/Treat Not Completed: Patient not medically ready   Adelheid Hoggard, Riley Nearing 09/30/2015, 7:47 AM

## 2015-10-01 ENCOUNTER — Inpatient Hospital Stay (HOSPITAL_COMMUNITY): Payer: Medicaid Other

## 2015-10-01 DIAGNOSIS — D72829 Elevated white blood cell count, unspecified: Secondary | ICD-10-CM | POA: Insufficient documentation

## 2015-10-01 LAB — BASIC METABOLIC PANEL
Anion gap: 10 (ref 5–15)
BUN: 29 mg/dL — ABNORMAL HIGH (ref 6–20)
CHLORIDE: 108 mmol/L (ref 101–111)
CO2: 23 mmol/L (ref 22–32)
CREATININE: 1.14 mg/dL — AB (ref 0.44–1.00)
Calcium: 8.6 mg/dL — ABNORMAL LOW (ref 8.9–10.3)
GFR, EST AFRICAN AMERICAN: 59 mL/min — AB (ref >60)
GFR, EST NON AFRICAN AMERICAN: 51 mL/min — AB (ref >60)
Glucose, Bld: 248 mg/dL — ABNORMAL HIGH (ref 65–99)
POTASSIUM: 4 mmol/L (ref 3.5–5.1)
SODIUM: 141 mmol/L (ref 135–145)

## 2015-10-01 LAB — GLUCOSE, CAPILLARY
GLUCOSE-CAPILLARY: 206 mg/dL — AB (ref 65–99)
GLUCOSE-CAPILLARY: 208 mg/dL — AB (ref 65–99)
GLUCOSE-CAPILLARY: 238 mg/dL — AB (ref 65–99)
Glucose-Capillary: 183 mg/dL — ABNORMAL HIGH (ref 65–99)
Glucose-Capillary: 206 mg/dL — ABNORMAL HIGH (ref 65–99)
Glucose-Capillary: 259 mg/dL — ABNORMAL HIGH (ref 65–99)

## 2015-10-01 LAB — POCT I-STAT 3, ART BLOOD GAS (G3+)
Acid-Base Excess: 1 mmol/L (ref 0.0–2.0)
Bicarbonate: 24.1 mEq/L — ABNORMAL HIGH (ref 20.0–24.0)
O2 SAT: 98 %
PCO2 ART: 34.6 mmHg — AB (ref 35.0–45.0)
PH ART: 7.453 — AB (ref 7.350–7.450)
TCO2: 25 mmol/L (ref 0–100)
pO2, Arterial: 105 mmHg — ABNORMAL HIGH (ref 80.0–100.0)

## 2015-10-01 LAB — CBC
HCT: 29.8 % — ABNORMAL LOW (ref 36.0–46.0)
HEMOGLOBIN: 9.8 g/dL — AB (ref 12.0–15.0)
MCH: 31.1 pg (ref 26.0–34.0)
MCHC: 32.9 g/dL (ref 30.0–36.0)
MCV: 94.6 fL (ref 78.0–100.0)
Platelets: 250 10*3/uL (ref 150–400)
RBC: 3.15 MIL/uL — AB (ref 3.87–5.11)
RDW: 14.3 % (ref 11.5–15.5)
WBC: 11.2 10*3/uL — AB (ref 4.0–10.5)

## 2015-10-01 LAB — PHOSPHORUS: PHOSPHORUS: 3.3 mg/dL (ref 2.5–4.6)

## 2015-10-01 LAB — MAGNESIUM: MAGNESIUM: 1.8 mg/dL (ref 1.7–2.4)

## 2015-10-01 LAB — TRIGLYCERIDES: Triglycerides: 93 mg/dL (ref <150)

## 2015-10-01 MED ORDER — DEXMEDETOMIDINE HCL IN NACL 400 MCG/100ML IV SOLN
0.4000 ug/kg/h | INTRAVENOUS | Status: DC
Start: 2015-10-01 — End: 2015-10-08
  Administered 2015-10-01 (×2): 0.4 ug/kg/h via INTRAVENOUS
  Administered 2015-10-02: 1 ug/kg/h via INTRAVENOUS
  Administered 2015-10-02: 0.4 ug/kg/h via INTRAVENOUS
  Administered 2015-10-02: 1 ug/kg/h via INTRAVENOUS
  Administered 2015-10-02 (×3): 0.4 ug/kg/h via INTRAVENOUS
  Administered 2015-10-02: 0.8 ug/kg/h via INTRAVENOUS
  Administered 2015-10-02: 0.4 ug/kg/h via INTRAVENOUS
  Administered 2015-10-03: 1.2 ug/kg/h via INTRAVENOUS
  Administered 2015-10-03: 1.5 ug/kg/h via INTRAVENOUS
  Administered 2015-10-03 (×2): 1.2 ug/kg/h via INTRAVENOUS
  Administered 2015-10-03: 1 ug/kg/h via INTRAVENOUS
  Administered 2015-10-03: 1.7 ug/kg/h via INTRAVENOUS
  Administered 2015-10-03: 1.2 ug/kg/h via INTRAVENOUS
  Administered 2015-10-03: 1.7 ug/kg/h via INTRAVENOUS
  Administered 2015-10-03: 1.2 ug/kg/h via INTRAVENOUS
  Administered 2015-10-03: 1.7 ug/kg/h via INTRAVENOUS
  Administered 2015-10-03: 1.2 ug/kg/h via INTRAVENOUS
  Administered 2015-10-04 (×2): 1.7 ug/kg/h via INTRAVENOUS
  Administered 2015-10-04: 1.1 ug/kg/h via INTRAVENOUS
  Administered 2015-10-04 (×2): 1.7 ug/kg/h via INTRAVENOUS
  Administered 2015-10-04: 0.9 ug/kg/h via INTRAVENOUS
  Administered 2015-10-04: 1.7 ug/kg/h via INTRAVENOUS
  Administered 2015-10-05: 0.8 ug/kg/h via INTRAVENOUS
  Administered 2015-10-05: 0.9 ug/kg/h via INTRAVENOUS
  Administered 2015-10-05: 0.4 ug/kg/h via INTRAVENOUS
  Administered 2015-10-06: 0.8 ug/kg/h via INTRAVENOUS
  Administered 2015-10-06: 0.7 ug/kg/h via INTRAVENOUS
  Administered 2015-10-06: 0.9 ug/kg/h via INTRAVENOUS
  Administered 2015-10-06: 0.7 ug/kg/h via INTRAVENOUS
  Administered 2015-10-07 (×2): 0.9 ug/kg/h via INTRAVENOUS
  Filled 2015-10-01: qty 50
  Filled 2015-10-01: qty 200
  Filled 2015-10-01 (×4): qty 100
  Filled 2015-10-01 (×3): qty 50
  Filled 2015-10-01: qty 100
  Filled 2015-10-01: qty 50
  Filled 2015-10-01: qty 100
  Filled 2015-10-01: qty 200
  Filled 2015-10-01 (×2): qty 50
  Filled 2015-10-01 (×2): qty 100
  Filled 2015-10-01: qty 50
  Filled 2015-10-01 (×3): qty 100
  Filled 2015-10-01: qty 50
  Filled 2015-10-01 (×4): qty 100
  Filled 2015-10-01: qty 50
  Filled 2015-10-01 (×8): qty 100

## 2015-10-01 MED ORDER — FENTANYL CITRATE (PF) 100 MCG/2ML IJ SOLN
25.0000 ug | INTRAMUSCULAR | Status: DC | PRN
Start: 2015-10-01 — End: 2015-10-08
  Administered 2015-10-02 – 2015-10-08 (×19): 50 ug via INTRAVENOUS
  Filled 2015-10-01 (×19): qty 2

## 2015-10-01 NOTE — Progress Notes (Signed)
PT Cancellation Note  Patient Details Name: Linda [name] MRN: [id number] DOB: [birth year]   Cancelled Treatment:    Reason Eval/Treat Not Completed: Patient not medically ready   Continues to be sedated and on the vent;   Will continue to follow for appropriateness of PT eval;   Van Clines, PT  Acute Rehabilitation Services Pager [phone] Office [phone]    Van Clines [facility] 10/01/2015, 12:02 PM

## 2015-10-01 NOTE — Progress Notes (Signed)
STROKE TEAM PROGRESS NOTE   SUBJECTIVE (INTERVAL HISTORY) Niece is at the bedside. Currently on versed drip. Patient had no documented events overnight.  Remains intubated.  She does not follow commands.    OBJECTIVE Temp:  [99.6 F (37.6 C)-101 F (38.3 C)] 99.6 F (37.6 C) (02/05 0400) Pulse Rate:  [103-127] 103 (02/05 0700) Cardiac Rhythm:  [-] Sinus tachycardia (02/05 0400) Resp:  [0-34] 20 (02/05 0700) BP: (119-179)/(62-141) 119/64 mmHg (02/05 0700) SpO2:  [100 %] 100 % (02/05 0700) Arterial Line BP: (70-191)/(28-90) 104/28 mmHg (02/05 0700) FiO2 (%):  [30 %-40 %] 30 % (02/05 0349) Weight:  [106.7 kg (235 lb 3.7 oz)] 106.7 kg (235 lb 3.7 oz) (02/05 0500)  CBC:   Recent Labs Lab 09/28/15 0112  09/29/15 0420 09/30/15 0350 10/01/15 0515  WBC 18.7*  < > 11.3* 13.0* 11.2*  NEUTROABS 2.6  --  6.1  --   --   HGB 12.0  < > 11.3* 11.2* 9.8*  HCT 35.8  < > 34.5* 34.9* 29.8*  MCV 92.5  < > 93.0 95.1 94.6  PLT 276  < > 288 PLATELET CLUMPS NOTED ON SMEAR, UNABLE TO ESTIMATE 250  < > = values in this interval not displayed.  Basic Metabolic Panel:   Recent Labs Lab 09/30/15 0350 10/01/15 0515  NA 142 141  K 5.2* 4.0  CL 109 108  CO2 21* 23  GLUCOSE 166* 248*  BUN 25* 29*  CREATININE 1.28* 1.14*  CALCIUM 8.8* 8.6*  MG 1.8 1.8  PHOS 4.5 3.3    Lipid Panel:     Component Value Date/Time   CHOL 170 09/28/2015 0938   TRIG 497* 09/28/2015 0938   TRIG 503* 09/28/2015 0938   HDL 38* 09/28/2015 0938   CHOLHDL 4.5 09/28/2015 0938   VLDL UNABLE TO CALCULATE IF TRIGLYCERIDE OVER 400 mg/dL 16/05/9603 5409   LDLCALC UNABLE TO CALCULATE IF TRIGLYCERIDE OVER 400 mg/dL 81/19/1478 2956   OZHY8M:  Lab Results  Component Value Date   HGBA1C 6.9* 09/28/2015   Urine Drug Screen:     Component Value Date/Time   LABOPIA NONE DETECTED 09/28/2015 0226   LABBENZ NONE DETECTED 09/28/2015 0226   AMPHETMU NONE DETECTED 09/28/2015 0226   THCU NONE DETECTED 09/28/2015 0226   LABBARB NONE DETECTED 09/28/2015 0226      IMAGING I have personally reviewed the radiological images below and agree with the radiology interpretations.  Ct Head Wo Contrast 09/28/2015  1. New calcific density in the left sylvian fissure, possible calcific distal M2 embolus. No visible acute infarct or hemorrhage. 2. Advanced chronic small vessel disease with multiple lacunar infarcts.   CTA NECK 09/28/2015   Moderately motion degraded examination, limited by LEFT venous contamination. No definite acute vascular process or hemodynamically significant stenosis. 3.5 x 5.7 cm dominant LEFT thyroid not oral for which follow up thyroid sonogram is recommended on a nonemergent basis.   CTA HEAD 09/28/2015   LEFT M2 segment occlusion corresponding to sylvian fissure density on today's CT, likely acute. Complete circle of Willis.  Mild intracranial atherosclerosis. Moderate calcific atherosclerosis the carotid siphons.   Cerebral Angiogram 09/28/2015 S/P Lt internal carotid arteriogram followed by complete revascularization of occluded dominant inferior division of LT MCA with x pass with trevoprovue 4 mmx 30 mm retrieval device and 6 mg of superselective Integrelin achieving a TICI 3 flow.  MRI  Large acute hemorrhagic left hemispheric infarct involves the majority of the left middle cerebral artery distribution. Hemorrhage is most notable  involving the left posterior temporal -parietal aspect of this acute infarct. Local mass effect upon the left lateral ventricle. At most there is minimal bowing of the septum to the right. Several small acute nonhemorrhagic right hemispheric infarcts involving right caudate head, superior aspect of the post limb right internal capsule, right frontal lobe, superior right lenticular nucleus and right occipital lobe. Several small acute nonhemorrhagic cerebellar infarcts greater on the right. Remote left lenticular nucleus infarct or hematoma with blood-stained  cleft now noted. Moderate to marked chronic small vessel disease changes. Atrophy.  EEG Abnormalities: 1) possible attenuation of sleep structures in the left central region 2) lack of waking state Clinical Interpretation: This EEG is consistent with a sleep/sedation EEG with a possible suggestion of a left central cerebral dysfunction, though I don't think this is definite by this study.  2D echo - Systolic function was normal. The estimated ejection fraction was in the range of 55% to 60%. Wall motion was normal; there were no regional wall motion abnormalities. Doppler parameters are consistent with abnormal left ventricular relaxation (grade 1 diastolic dysfunction).  LE venous doppler - preliminary report no DVT    Physical exam  Temp:  [99.6 F (37.6 C)-101 F (38.3 C)] 99.6 F (37.6 C) (02/05 0400) Pulse Rate:  [103-127] 103 (02/05 0700) Resp:  [0-34] 20 (02/05 0700) BP: (119-179)/(62-141) 119/64 mmHg (02/05 0700) SpO2:  [100 %] 100 % (02/05 0700) Arterial Line BP: (70-191)/(28-90) 104/28 mmHg (02/05 0700) FiO2 (%):  [30 %-40 %] 30 % (02/05 0349) Weight:  [106.7 kg (235 lb 3.7 oz)] 106.7 kg (235 lb 3.7 oz) (02/05 0500)  General - obese, well developed, intubated; versed gtt  HEENT:  NCAT; sclera slightly icteric; copious oral secretions Cardiovascular - Regular rate and rhythm. Pulm:  Scattered rhonchi Abd:  Obese; ND, normal bowel sounds Ext:  No C/C/E  Neurologic Exam MENTAL STATUS: Intubated and on versed Not responsive to voice, does not open eyes.  Pupils 3mm, poorly reactive to light, slight doll's eye with head movement, weak corneal and strong gag reflexes, breathing over the vent.   MOTOR/SENSORY: On painful stimulation, she has weak withdrawal of right UE and no response in the right LE; she spontaneously moves the left UE and LE  Coordination or gait can not able to test.   ASSESSMENT/PLAN [name] is a 60 y.o. female with  history of left BG ICH in 2012, hypertension, cocaine abuse, renal failure and hepatitis C presenting with altered mental status, right side weakness and leftward gaze. Also concerning for possible seizure activity initial presentation. She did not receive IV t-PA due to hx ICH, CTA showed a L M2 occlusion, she was sent to IR where she received complete TICI3 revascularization of the L MCA with mechanical thrombectomy with trevo and IA Integrilin.  Stroke:  left MCA infarct with hemorrhagic transformation s/p revascularizationf of L M2, multifocal scattered infarcts involving bilateral posterior and anterior circulation, consistent with cardioembolic source although pt does have significant large vessel intracranial atherosclerosis.  Resultant  Intubated and unresponsive  MRI - multifocal scattered infarcts involving bilateral posterior and anterior circulation, largest at left MCA territory with patchy hemorrhagic transformation on MRI but not on CT  Repeat CT - left MCA hemorrhagic infarct   CTA head L M2 occlusion, b/l ICA supraclinoid segment significant atherosclerosis, L>R with moderate to severe stenosis.  CTA neck L thyroid nodule  2D Echo  Results above  LE venous doppler preliminary negative  LDL unable to  calculate due to high TG   HgbA1c 6.9  UDS positive for cocaine  Heparin subq for VTE prophylaxis Diet NPO time specified  No antithrombotic prior to admission, now on ASA 325mg .   Ongoing aggressive stroke risk factor management  Therapy recommendations:  pending   Disposition:  pending   ? Seizure  Questionable seizure presentation initially as reported in chart  EEG showed no seizure but under sedation  Hold off AEDs at this time  on versed drip  Hx of ICH  2012 left BG ICH; during hospital course:  BP high at 200s  Urine positive with cocaine   S/p trach and PEG at that time  Lost follow up since  Acute respiratory failure  Intubated in ED  due to airway protection  On versed with ventilation  CCM following  Hypertensive Emergency  BP as high as 202/124  off cardene drip  Stable now with tight parameters post IR < 160  Hyperlipidemia  Home meds:  No statin  LDL not able to calculate due to high TG , goal < 70  Total chol 170  Consider statin once po access.  Cocaine abuse  urine positive both in 2012 and this admission  Likely one of the causes of intracranial vascular stenosis  Need cessation counseling   Other Stroke Risk Factors  Obesity  Other Active Problems  Hepatitis C  Hx Dysphagia secondary to ICH in 2012 with PEG  Leukocytosis and fever:  Fever Tm 101.3; WBCs 18.7->11.3-> 13  Hospital day # 3  This patient is critically ill due to large left MCA infarct with left M2 occlusion s/p intervention, hemorrhagic transformation and at significant risk of neurological worsening, death form recurrent infarcts, increased ICH, brain herniation, cerebral edema, heart failure. This patient's care requires constant monitoring of vital signs, hemodynamics, respiratory and cardiac monitoring, review of multiple databases, neurological assessment, discussion with family, other specialists and medical decision making of high complexity. I spent 40 minutes of neurocritical care time in the care of this patient.    Creatinine slightly improved  Hyperkalemia improved  Leukocytosis improving; tmax= 10; CXR cleared  Elevated TGs and blood sugars.  CCM following   Dr. Sula Soda Stroke Neurology 10/01/2015 7:52 AM      To contact Stroke Continuity provider, please refer to [email or web address]. After hours, contact General Neurology

## 2015-10-01 NOTE — Progress Notes (Addendum)
Wasted 25 ml of versed. Witnessed by Docia Barrier, RN and Berniece Pap, RN

## 2015-10-01 NOTE — Progress Notes (Signed)
PULMONARY / CRITICAL CARE MEDICINE   Name: Linda [name] MRN: [id number] DOB: 01-Jan-1956    ADMISSION DATE:  09/28/2015 CONSULTATION DATE:  2/2  REFERRING MD:  Neuro  CHIEF COMPLAINT:  Obtunded   HISTORY OF PRESENT ILLNESS:   60 yo AAF with hx of stroke 2012 that required trach/peg and with residual right side weakness was seen at [facility] after falling off a couch. Code stroke called per EDP and transferred to [facility] for IR procedure for revascularization of of left MCA. She was then admitted to NSICU and PCCM asked to help manage vent and critical care issues. She remains intubated and sedated on arrival to ICU. Goals of extubation will be clarified with neuro.   SUBJECTIVE:  Remains on full vent support. Failed awakening and weaning trials. Blood pressure stable; off Cardene gtt.   VITAL SIGNS: BP 160/75 mmHg  Pulse 115  Temp(Src) 100.3 F (37.9 C) (Axillary)  Resp 31  Ht 5\' 7"  (1.702 m)  Wt 106.7 kg (235 lb 3.7 oz)  BMI 36.83 kg/m2  SpO2 100%  HEMODYNAMICS:    VENTILATOR SETTINGS: Vent Mode:  [-] PSV;CPAP FiO2 (%):  [30 %-40 %] 30 % Set Rate:  [20 bmp] 20 bmp Vt Set:  [490 mL] 490 mL PEEP:  [5 cmH20] 5 cmH20 Pressure Support:  [8 cmH20] 8 cmH20 Plateau Pressure:  [12 cmH20-16 cmH20] 13 cmH20  INTAKE / OUTPUT: I/O last 3 completed shifts: In: 4239.9 [I.V.:2839.2; NG/GT:1400.8] Out: 3060 [Urine:3060]  PHYSICAL EXAMINATION: General:  Intubated and sedated Neuro: Withdraws to noxious stimulus HEENT:  ETT-> vent, short neck, pupils pinpoint, unreactive Cardiovascular: ST at 106; S1/S2, no MRG Lungs: Bilateral airflow Abdomen:  Obese, normal BS, S/NT/ND Musculoskeletal: No joint deformities Skin: warm and dry  LABS:  BMET  Recent Labs Lab 09/29/15 0420 09/30/15 0350 10/01/15 0515  NA 142 142 141  K 4.5 5.2* 4.0  CL 110 109 108  CO2 24 21* 23  BUN 20 25* 29*  CREATININE 1.43* 1.28* 1.14*  GLUCOSE 144* 166* 248*   Electrolytes  Recent Labs Lab  09/29/15 0420 09/30/15 0350 10/01/15 0515  CALCIUM 8.5* 8.8* 8.6*  MG 1.8 1.8 1.8  PHOS 4.6 4.5 3.3    CBC  Recent Labs Lab 09/29/15 0420 09/30/15 0350 10/01/15 0515  WBC 11.3* 13.0* 11.2*  HGB 11.3* 11.2* 9.8*  HCT 34.5* 34.9* 29.8*  PLT 288 PLATELET CLUMPS NOTED ON SMEAR, UNABLE TO ESTIMATE 250   Coag's  Recent Labs Lab 09/28/15 0112  APTT 27  INR 0.98   Sepsis Markers  Recent Labs Lab 09/28/15 0938 09/29/15 0420 09/30/15 0350  LATICACIDVEN 1.5  --   --   PROCALCITON <0.10 0.10 0.10   ABG  Recent Labs Lab 09/28/15 0436 09/28/15 0805 10/01/15 0358  PHART 7.255* 7.364 7.453*  PCO2ART 60.3* 40.1 34.6*  PO2ART 180.0* 304* 105.0*    Liver Enzymes  Recent Labs Lab 09/28/15 0112  AST 26  ALT 16  ALKPHOS 94  BILITOT 0.4  ALBUMIN 4.1   Cardiac Enzymes  Recent Labs Lab 09/28/15 0112  TROPONINI <0.03   Glucose  Recent Labs Lab 09/30/15 1128 09/30/15 1538 09/30/15 2032 09/30/15 2324 10/01/15 0325 10/01/15 0743  GLUCAP 192* 155* 205* 191* 206* 183*    Imaging Dg Chest Port 1 View  10/01/2015  CLINICAL DATA:  Endotracheal tube EXAM: PORTABLE CHEST 1 VIEW COMPARISON:  09/30/2015 FINDINGS: Endotracheal tube 4 cm from carina. Stable cardiac silhouette with ectatic aorta. Lungs are clear. No pneumothorax. IMPRESSION:  Endotracheal tube in good position.  Lungs are clear. Electronically Signed   By: Genevive Bi M.D.   On: 10/01/2015 07:52   Dg Abd Portable 1v  09/30/2015  CLINICAL DATA:  Orogastric tube placement. Orogastric tube removed and replaced. EXAM: PORTABLE ABDOMEN - 1 VIEW COMPARISON:  Earlier this day at 2248 hour FINDINGS: Tip and side port of the enteric tube below the diaphragm in the stomach. There is a normal bowel gas pattern. IMPRESSION: Tip and side port of the enteric tube below the diaphragm in the stomach. Electronically Signed   By: Rubye Oaks M.D.   On: 09/30/2015 23:18   Dg Abd Portable 1v  09/30/2015  CLINICAL  DATA:  Encounter for OG tube placement. EXAM: PORTABLE ABDOMEN - 1 VIEW COMPARISON:  Earlier this day at 2152 hour FINDINGS: Tip and side port of the enteric tube are below the diaphragm in the stomach. And tip is directed cranially. There is a normal bowel gas pattern. IMPRESSION: Tip and side port of the enteric tube below the diaphragm in the stomach, tip directed cranially. Electronically Signed   By: Rubye Oaks M.D.   On: 09/30/2015 23:14   Dg Abd Portable 1v  09/30/2015  CLINICAL DATA:  Orogastric tube placement EXAM: PORTABLE ABDOMEN - 1 VIEW COMPARISON:  09/29/2015. FINDINGS: Gastric tube is coiled in the stomach with the tip directed towards fundus. Normal bowel gas pattern. IMPRESSION: Gastric tube coiled in the stomach.  Normal bowel gas pattern. Electronically Signed   By: Marlan Palau M.D.   On: 09/30/2015 22:26   STUDIES:  Echo 02/20>> EEG 02/20: EEG is consistent with a sleep/sedation; possible suggestion of a left central cerebral dysfunction  CULTURES: 2/2 bc x 2>> 2/2 uc>> 2/2 sputum>>GRAM POSITIVE COCCI  IN PAIRS, IN CHAINS, IN CLUSTERS  AND FEW GRAM NEGATIVE RODS   ANTIBIOTICS: None  SIGNIFICANT EVENTS: Code stroke 2/2  LINES/TUBES: 2/2 OTT>> 2/2 rt femoral sheath>> 2/3 left rad a line>>  DISCUSSION: 60 yo AAF hx of stroke and new neurological event + cocaine. Now s/p interventional procedure for L MCA occlusion.   ASSESSMENT / PLAN:  PULMONARY A: VDRF post IR procedure on 2/2 P:   PS trials but no extubation given mental status. Titrate O2 for sat of 88-92%. Will need to discuss trach/peg vs withdrawal with family.  CARDIOVASCULAR A:  Hx of HTN, exacerbated by cocaine use.  P:  Off cardene GTT. BP parameters per neurology. Hydralazine prn to maintain SBP<160. Echocardiogram 55-60%.  RENAL Lab Results  Component Value Date   CREATININE 1.14* 10/01/2015   CREATININE 1.28* 09/30/2015   CREATININE 1.43* 09/29/2015   CREATININE 1.27  02/28/2014   CREATININE 1.15 04/10/2012   CREATININE 1.32* 11/13/2011   A:   ARI, likely related to HTN, volume status and dye load for procedure; slight improvement.  P:   KVO IVF. Follow BMP. Replace electrolytes as indicated.  GASTROINTESTINAL A:   Hx of  Dysphagia with Peg in 2012 P:   TF. PPI.  HEMATOLOGIC A:   No acute issues P:  PAS  INFECTIOUS A:   No immediate concern for infection P:   Monitor infection parameters   ENDOCRINE A:   Hyperglycemia without h/o diabetes P:   SSI and Blood glucose monitoring per ICU protocol  NEUROLOGIC A:   Post Left MCA recannulization of inferior division 2/2 Hx of hemorraghic stroke 2012 with residual rt side weakness. Required trach/peg in 2012 Substance abuse/ + for cocaine    P:  D/C versed drip. D/C fentanyl drip. PRN fentanyl. Precedex drip.  FAMILY  - Updates: No family at bedside.  - Inter-disciplinary family meet or Palliative Care meeting due by: 10/05/15  The patient is critically ill with multiple organ systems failure and requires high complexity decision making for assessment and support, frequent evaluation and titration of therapies, application of advanced monitoring technologies and extensive interpretation of multiple databases.   Critical Care Time devoted to patient care services described in this note is  35  Minutes. This time reflects time of care of this signee Dr Koren Bound. This critical care time does not reflect procedure time, or teaching time or supervisory time of PA/NP/Med student/Med Resident etc but could involve care discussion time.  Alyson Reedy, M.D. [facility] Pulmonary/Critical Care Medicine. Pager: [phone]. After hours pager: [phone].  10/01/2015, 9:47 AM

## 2015-10-01 NOTE — Progress Notes (Signed)
ABG collected  

## 2015-10-01 NOTE — Progress Notes (Signed)
CRITICAL VALUE ALERT  Critical value received:  Aerobic blood culture bottle has grown gram positive cocci Date of notification:  10/01/15  Time of notification:  1207  Critical value read back:Yes.    Nurse who received alert:  Docia Barrier, RN  MD notified (1st page):  Eliezer Champagne & Dr. Molli Knock   Time of first page: 1213  MD notified (2nd page):  Time of second page:  Responding MD:  Eliezer Champagne & Dr. Molli Knock   Time MD responded:  1217

## 2015-10-01 NOTE — Progress Notes (Signed)
Referring Physician(s): Xu  Chief Complaint:  CVA L MCA clot retrieval 2/2   Subjective:  On vent No response   Allergies: Review of patient's allergies indicates no known allergies.  Medications: Prior to Admission medications   Medication Sig Start Date End Date Taking? Authorizing Provider  acetaminophen (TYLENOL) 500 MG tablet Take 500 mg by mouth every 8 (eight) hours as needed. pain   Yes Historical Provider, MD  albuterol (PROVENTIL HFA;VENTOLIN HFA) 108 (90 Base) MCG/ACT inhaler Inhale 1 puff into the lungs every 6 (six) hours as needed for wheezing or shortness of breath.   Yes Historical Provider, MD  amitriptyline (ELAVIL) 50 MG tablet Take 75 mg by mouth at bedtime.   Yes Historical Provider, MD  amLODipine (NORVASC) 10 MG tablet Take 10 mg by mouth daily.   Yes Historical Provider, MD  atorvastatin (LIPITOR) 40 MG tablet Take 80 mg by mouth daily.   Yes Historical Provider, MD  citalopram (CELEXA) 20 MG tablet Take 20 mg by mouth daily.   Yes Historical Provider, MD  clonazePAM (KLONOPIN) 0.5 MG tablet Take 0.5 mg by mouth 2 (two) times daily as needed. For anxiety   Yes Historical Provider, MD  ergocalciferol (VITAMIN D2) 50000 units capsule Take 50,000 Units by mouth once a week.   Yes Historical Provider, MD  Insulin Glargine (LANTUS SOLOSTAR) 100 UNIT/ML Solostar Pen Inject 60 Units into the skin every evening.   Yes Historical Provider, MD  lisinopril-hydrochlorothiazide (PRINZIDE,ZESTORETIC) 20-12.5 MG tablet Take 1 tablet by mouth daily.   Yes Historical Provider, MD  pantoprazole (PROTONIX) 40 MG tablet Take 40 mg by mouth daily. Reported on 09/28/2015   Yes Historical Provider, MD     Vital Signs: BP 160/75 mmHg  Pulse 115  Temp(Src) 100.3 F (37.9 C) (Axillary)  Resp 31  Ht  (1.702 m)  Wt 235 lb 3.7 oz (106.7 kg)  BMI 36.83 kg/m2  SpO2 100%  Physical Exam  Abdominal: Soft.  Neurological:  No response  Skin: Skin is warm.  Rt groin  site clean and dry No bleeding  no hematoma Rt foot 2+ pulses    Imaging: Ct Angio Head W/cm &/or Wo Cm  09/28/2015  CLINICAL DATA:  Stroke-like symptoms after cocaine use. History of hepatitis-C, renal failure, hypertension, stroke. EXAM: CT ANGIOGRAPHY HEAD AND NECK TECHNIQUE: Multidetector CT imaging of the head and neck was performed using the standard protocol during bolus administration of intravenous contrast. Multiplanar CT image reconstructions and MIPs were obtained to evaluate the vascular anatomy. Carotid stenosis measurements (when applicable) are obtained utilizing NASCET criteria, using the distal internal carotid diameter as the denominator. CONTRAST:  80mL OMNIPAQUE IOHEXOL 350 MG/ML SOLN COMPARISON:  CT head September 28, 2015 at 0124 hours and MRI/MRA head June 01, 2011 FINDINGS: Moderately motion degraded examination. CTA NECK Aortic arch: Normal appearance of the thoracic arch, normal branch pattern. Motion obscures the origins of the arch vessels. Moderate calcific atherosclerosis of the aortic arch. Arch vessels appear patent. Right carotid system: Common carotid artery is widely patent, coursing in a straight line fashion. Normal appearance of the carotid bifurcation without hemodynamically significant stenosis by NASCET criteria. Mild eccentric calcific atherosclerosis. Normal appearance of the included internal carotid artery. Left carotid system: Contrast reflux into LEFT internal jugular vein limits evaluation of the LEFT Common carotid artery due to streak artifact. Common carotid artery is widely patent, coursing in a straight line fashion. Normal appearance of the carotid bifurcation without hemodynamically significant stenosis by  NASCET criteria. Normal appearance of the included internal carotid artery. Vertebral arteries:Origins of the vertebral arteries obscured by patient motion. Normal appearance of the vertebral arteries, which appear widely patent. Skeleton: No acute  osseous process though bone windows have not been submitted. Multiple absent teeth. Other neck: Soft tissues of the neck are nonacute though, not tailored for evaluation. 3.5 x 5.7 cm dominant LEFT thyroid nodule mildly displacing and narrowing the trachea to the RIGHT. CTA HEAD Anterior circulation: Normal appearance of the cervical internal carotid arteries, petrous, cavernous and supra clinoid internal carotid arteries. Moderate calcific atherosclerosis the carotid siphons. Widely patent anterior communicating artery. LEFT M2 occlusion, density within LEFT sylvian fissure on today's examination likely represent is thromboembolism. Normal appearance of the anterior cerebral arteries. Posterior circulation: Normal appearance of the vertebral arteries, vertebrobasilar junction and basilar artery, as well as main branch vessels. Robust bilateral posterior communicating arteries. Normal appearance of the posterior cerebral arteries. No hemodynamically significant stenosis, dissection, contrast extravasation or aneurysm within the anterior nor posterior circulation. Mild general luminal irregularity of the intracranial vessels. IMPRESSION: CTA NECK: Moderately motion degraded examination, limited by LEFT venous contamination. No definite acute vascular process or hemodynamically significant stenosis. 3.5 x 5.7 cm dominant LEFT thyroid not oral for which follow up thyroid sonogram is recommended on a nonemergent basis. CTA HEAD: LEFT M2 segment occlusion corresponding to sylvian fissure density on today's CT, likely acute. Complete circle of Willis.  Mild intracranial atherosclerosis. Acute findings discussed with and reconfirmed by Dr.CHARLES STEWART on 09/28/2015 at 4:05 am. Electronically Signed   By: Awilda Metro M.D.   On: 09/28/2015 04:08   Ct Head Wo Contrast  09/29/2015  CLINICAL DATA:  Followup LEFT M2 occlusion treated endovascularly. EXAM: CT HEAD WITHOUT CONTRAST TECHNIQUE: Contiguous axial images were  obtained from the base of the skull through the vertex without intravenous contrast. COMPARISON:  MRI of the head September 28, 2015 and CT head September 28, 2015 FINDINGS: Large wedge-like hypodensities LEFT frontal, temporal, parietal and occipital lobes and same distribution as reduced diffusion on yesterday's MRI. Faint density within LEFT posterior frontal lobe residual contrast staining without lobar hematoma. Multiple tiny densities LEFT frontal LEFT frontal lobe favoring residual intravascular contrast, unchanged from recent CT. Worsening mass-effect with 3 mm LEFT-to-RIGHT midline shift, new effacement of the LEFT atrium without ventricular entrapment. Small bilateral basal ganglia lacunar infarcts. Patchy to confluent white matter changes compatible with chronic small vessel ischemic disease. No abnormal extra-axial fluid collection. Basal cisterns are patent. Moderate calcific atherosclerosis the carotid siphons. Mild paranasal sinus mucosal thickening. Mastoid air cells are well aerated. Ocular globes and orbital contents are normal. No skull fracture. IMPRESSION: Evolving large LEFT MCA territory infarct with small amount of residual contrast staining, no hemorrhagic conversion. Increasing edema and mass effect with 3 mm LEFT-to-RIGHT midline shift, no ventricular entrapment. Old bilateral basal ganglia lacunar infarcts. Moderate to severe chronic small vessel ischemic disease. Electronically Signed   By: Awilda Metro M.D.   On: 09/29/2015 05:44   Ct Head Wo Contrast  09/28/2015  CLINICAL DATA:  60 year old female with left M2 occlusion treated endovascularly. Follow-up. Subsequent encounter. EXAM: CT HEAD WITHOUT CONTRAST TECHNIQUE: Contiguous axial images were obtained from the base of the skull through the vertex without intravenous contrast. COMPARISON:  09/28/2015. FINDINGS: Now well delineated is left middle cerebral artery moderate size infarct (best noted superiorly). Gyriform enhancement  left temporal-parietal lobe may reflect result of recent angiogram indicating region at risk for infarct/ischemia. Hyperdense material within  the left sylvian fissure may represent combination of blood and/ or contrast. No hydrocephalus or midline shift. Moderate chronic small vessel disease type changes. Remote basal ganglia infarcts bilaterally. Remote right paracentral pontine infarct. Mucosal thickening ethmoid sinus air cells. IMPRESSION: Now well delineated is left middle cerebral artery moderate size infarct (best noted superiorly). Gyriform enhancement left temporal-parietal lobe may reflect result of recent angiogram indicating region at risk for infarct/ischemia. Hyperdense material within the left sylvian fissure may represent combination of blood and/ or contrast. Moderate chronic small vessel disease type changes. Remote basal ganglia infarcts bilaterally. Remote right paracentral pontine infarct. These results will be called to the ordering clinician or representative by the Radiologist Assistant, and communication documented in the PACS or zVision Dashboard. Electronically Signed   By: Lacy Duverney M.D.   On: 09/28/2015 07:42   Ct Head Wo Contrast  09/28/2015  CLINICAL DATA:  Right facial droop.  Code stroke. EXAM: CT HEAD WITHOUT CONTRAST TECHNIQUE: Contiguous axial images were obtained from the base of the skull through the vertex without intravenous contrast. COMPARISON:  10/27/2014 FINDINGS: Skull and Sinuses:Negative for fracture or destructive process. No sinusitis or mastoiditis Visualized orbits: Nonspecific dysconjugate gaze. Brain: Extensive chronic small vessel disease with ischemic gliosis throughout the deep cerebral white matter and chronic (based on prior) lacunar infarcts in the bilateral thalamus and deep white matter tracts. Chronic patchy ischemic change also seen in the pons, greater on the right. There is new calcific density in the mid left sylvian fissure, in line with a MCA  branch, possible calcific embolus. No evidence of cortical infarct. Aspects is 10. No hemorrhage, hydrocephalus, or shift. Critical Value/emergent results were called by telephone at the time of interpretation on 09/28/2015 at 1:42 am to Dr. Loleta Rose , who verbally acknowledged these results. IMPRESSION: 1. New calcific density in the left sylvian fissure, possible calcific distal M2 embolus. No visible acute infarct or hemorrhage. 2. Advanced chronic small vessel disease with multiple lacunar infarcts. Electronically Signed   By: Marnee Spring M.D.   On: 09/28/2015 01:49   Ct Angio Neck W/cm &/or Wo/cm  09/28/2015  CLINICAL DATA:  Stroke-like symptoms after cocaine use. History of hepatitis-C, renal failure, hypertension, stroke. EXAM: CT ANGIOGRAPHY HEAD AND NECK TECHNIQUE: Multidetector CT imaging of the head and neck was performed using the standard protocol during bolus administration of intravenous contrast. Multiplanar CT image reconstructions and MIPs were obtained to evaluate the vascular anatomy. Carotid stenosis measurements (when applicable) are obtained utilizing NASCET criteria, using the distal internal carotid diameter as the denominator. CONTRAST:  80mL OMNIPAQUE IOHEXOL 350 MG/ML SOLN COMPARISON:  CT head September 28, 2015 at 0124 hours and MRI/MRA head June 01, 2011 FINDINGS: Moderately motion degraded examination. CTA NECK Aortic arch: Normal appearance of the thoracic arch, normal branch pattern. Motion obscures the origins of the arch vessels. Moderate calcific atherosclerosis of the aortic arch. Arch vessels appear patent. Right carotid system: Common carotid artery is widely patent, coursing in a straight line fashion. Normal appearance of the carotid bifurcation without hemodynamically significant stenosis by NASCET criteria. Mild eccentric calcific atherosclerosis. Normal appearance of the included internal carotid artery. Left carotid system: Contrast reflux into LEFT internal  jugular vein limits evaluation of the LEFT Common carotid artery due to streak artifact. Common carotid artery is widely patent, coursing in a straight line fashion. Normal appearance of the carotid bifurcation without hemodynamically significant stenosis by NASCET criteria. Normal appearance of the included internal carotid artery. Vertebral arteries:Origins of the  vertebral arteries obscured by patient motion. Normal appearance of the vertebral arteries, which appear widely patent. Skeleton: No acute osseous process though bone windows have not been submitted. Multiple absent teeth. Other neck: Soft tissues of the neck are nonacute though, not tailored for evaluation. 3.5 x 5.7 cm dominant LEFT thyroid nodule mildly displacing and narrowing the trachea to the RIGHT. CTA HEAD Anterior circulation: Normal appearance of the cervical internal carotid arteries, petrous, cavernous and supra clinoid internal carotid arteries. Moderate calcific atherosclerosis the carotid siphons. Widely patent anterior communicating artery. LEFT M2 occlusion, density within LEFT sylvian fissure on today's examination likely represent is thromboembolism. Normal appearance of the anterior cerebral arteries. Posterior circulation: Normal appearance of the vertebral arteries, vertebrobasilar junction and basilar artery, as well as main branch vessels. Robust bilateral posterior communicating arteries. Normal appearance of the posterior cerebral arteries. No hemodynamically significant stenosis, dissection, contrast extravasation or aneurysm within the anterior nor posterior circulation. Mild general luminal irregularity of the intracranial vessels. IMPRESSION: CTA NECK: Moderately motion degraded examination, limited by LEFT venous contamination. No definite acute vascular process or hemodynamically significant stenosis. 3.5 x 5.7 cm dominant LEFT thyroid not oral for which follow up thyroid sonogram is recommended on a nonemergent basis.  CTA HEAD: LEFT M2 segment occlusion corresponding to sylvian fissure density on today's CT, likely acute. Complete circle of Willis.  Mild intracranial atherosclerosis. Acute findings discussed with and reconfirmed by Dr.CHARLES STEWART on 09/28/2015 at 4:05 am. Electronically Signed   By: Awilda Metro M.D.   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 Subsequent encounter. EXAM: MRI HEAD WITHOUT CONTRAST TECHNIQUE: Multiplanar, multiecho pulse sequences of the brain and surrounding structures were obtained without intravenous contrast. COMPARISON:  09/28/2015 head CT. FINDINGS: Large acute hemorrhagic left hemispheric infarct involves the majority of the left middle cerebral artery distribution (left frontal lobe, left parietal lobe, left temporal lobe, left corona radiata and posterior left operculum region). Hemorrhage is most notable involving the left posterior temporal -parietal aspect of this acute infarct. Local mass effect upon the left lateral ventricle. At most there is minimal bowing of the septum to the right. Several small acute nonhemorrhagic right hemispheric infarcts involving right caudate head, superior aspect of the post limb right internal capsule, right frontal lobe, superior right lenticular nucleus and right occipital lobe. Several small acute nonhemorrhagic cerebellar infarcts greater on the right. Remote left lenticular nucleus infarct or hematoma with blood-stained cleft now noted. Moderate to marked chronic small vessel disease changes. Atrophy without hydrocephalus. No intracranial mass lesion noted on this unenhanced exam. Scattered blood breakdown products right cerebellum and pons consistent with prior episodes hemorrhagic ischemia. Major intracranial vascular structures are patent.  Slightly heterogeneous bone marrow of the clivus and upper cervical spine without discrete mass identified. Stability can be confirmed on follow-up. Mild exophthalmos.  Cervical medullary junction unremarkable. IMPRESSION: Large acute hemorrhagic left hemispheric infarct involves the majority of the left middle cerebral artery distribution. Hemorrhage is most notable involving the left posterior temporal -parietal aspect of this acute infarct. Local mass effect upon the left lateral ventricle. At most there is minimal bowing of the septum to the right. Several small acute nonhemorrhagic right hemispheric infarcts involving right caudate head, superior aspect of the post limb right internal capsule, right frontal lobe, superior right lenticular nucleus and right occipital lobe. Several small  acute nonhemorrhagic cerebellar infarcts greater on the right. Remote left lenticular nucleus infarct or hematoma with blood-stained cleft now noted. Moderate to marked chronic small vessel disease changes. Atrophy. Electronically Signed   By: Lacy Duverney M.D.   On: 09/28/2015 13:16   Dg Chest Port 1 View  10/01/2015  CLINICAL DATA:  Endotracheal tube EXAM: PORTABLE CHEST 1 VIEW COMPARISON:  09/30/2015 FINDINGS: Endotracheal tube 4 cm from carina. Stable cardiac silhouette with ectatic aorta. Lungs are clear. No pneumothorax. IMPRESSION: Endotracheal tube in good position.  Lungs are clear. Electronically Signed   By: Genevive Bi M.D.   On: 10/01/2015 07:52   Dg Chest Port 1 View  09/30/2015  CLINICAL DATA:  Ventilator dependence. EXAM: PORTABLE CHEST 1 VIEW COMPARISON:  09/29/2015 FINDINGS: Endotracheal tube terminates in the distal trachea near the right mainstem bronchus orifice. Lung volumes are unchanged. Cardiomediastinal silhouette is unchanged. Minimal left basilar atelectasis is unchanged. No overt pulmonary edema, sizable pleural effusion, or pneumothorax is identified. IMPRESSION: 1. Endotracheal tube tip  near the right mainstem bronchus orifice. Recommend retracting 2.5 cm. 2. Minimal left basilar atelectasis. These results were called by telephone at the time of interpretation on 09/30/2015 at 10:09 am to patient's ICU nurse Josh, who verbally acknowledged these results. Electronically Signed   By: Sebastian Ache M.D.   On: 09/30/2015 10:11   Dg Chest Port 1 View  09/29/2015  CLINICAL DATA:  Respiratory failure EXAM: PORTABLE CHEST 1 VIEW COMPARISON:  09/28/2015 FINDINGS: Endotracheal tube terminates 1.5 cm above the carina. Mild patchy opacity at the lung bases, likely atelectasis. No focal consolidation. No pleural effusion or pneumothorax. The heart is normal in size. Enteric tube courses into the stomach. IMPRESSION: Endotracheal tube terminates 1.5 cm above the carina. Electronically Signed   By: Charline Bills M.D.   On: 09/29/2015 10:45   Dg Chest Portable 1 View  09/28/2015  CLINICAL DATA:  Intubation EXAM: PORTABLE CHEST 1 VIEW COMPARISON:  10/27/2014 FINDINGS: High endotracheal tube with tip at the thoracic inlet and near the glottis. Low lung volumes with diffuse increase in interstitial opacity. Mild cardiomegaly. Aortic tortuosity accentuated by volumes and rotation. These results were called by telephone at the time of interpretation on 09/28/2015 at 4:18 am to RN Shanda Bumps, who verbally acknowledged these results. IMPRESSION: 1. High endotracheal tube with balloon near the glottis. Recommend advancement by 3 to 4 cm. 2. Diffuse increase in lung opacity which may be from low volumes and atelectasis or aspiration. Electronically Signed   By: Marnee Spring M.D.   On: 09/28/2015 04:20   Dg Abd Portable 1v  09/30/2015  CLINICAL DATA:  Orogastric tube placement. Orogastric tube removed and replaced. EXAM: PORTABLE ABDOMEN - 1 VIEW COMPARISON:  Earlier this day at 2248 hour FINDINGS: Tip and side port of the enteric tube below the diaphragm in the stomach. There is a normal bowel gas pattern. IMPRESSION:  Tip and side port of the enteric tube below the diaphragm in the stomach. Electronically Signed   By: Rubye Oaks M.D.   On: 09/30/2015 23:18   Dg Abd Portable 1v  09/30/2015  CLINICAL DATA:  Encounter for OG tube placement. EXAM: PORTABLE ABDOMEN - 1 VIEW COMPARISON:  Earlier this day at 2152 hour FINDINGS: Tip and side port of the enteric tube are below the diaphragm in the stomach. And tip is directed cranially. There is a normal bowel gas pattern. IMPRESSION: Tip and side port of the enteric tube below the diaphragm in the stomach,  tip directed cranially. Electronically Signed   By: Rubye Oaks M.D.   On: 09/30/2015 23:14   Dg Abd Portable 1v  09/30/2015  CLINICAL DATA:  Orogastric tube placement EXAM: PORTABLE ABDOMEN - 1 VIEW COMPARISON:  09/29/2015. FINDINGS: Gastric tube is coiled in the stomach with the tip directed towards fundus. Normal bowel gas pattern. IMPRESSION: Gastric tube coiled in the stomach.  Normal bowel gas pattern. Electronically Signed   By: Marlan Palau M.D.   On: 09/30/2015 22:26   Dg Abd Portable 1v  09/29/2015  CLINICAL DATA:  Orogastric tube placement. EXAM: PORTABLE ABDOMEN - 1 VIEW COMPARISON:  Abdominal radiograph of June 04, 2011 FINDINGS: In orogastric tube is in place with the tip in the region of the pylorus or proximal duodenum. The stomach does not appear distended with gas. The small and large bowel gas pattern is normal. The lung bases are clear. IMPRESSION: The orogastric tube tip lies in the distal stomach or duodenal bulb. Electronically Signed   By: David  [place] M.D.   On: 09/29/2015 07:41    Labs:  CBC:  Recent Labs  09/28/15 0938 09/29/15 0420 09/30/15 0350 10/01/15 0515  WBC 8.6 11.3* 13.0* 11.2*  HGB 13.0 11.3* 11.2* 9.8*  HCT 39.0 34.5* 34.9* 29.8*  PLT PLATELET CLUMPS NOTED ON SMEAR, UNABLE TO ESTIMATE 288 PLATELET CLUMPS NOTED ON SMEAR, UNABLE TO ESTIMATE 250    COAGS:  Recent Labs  09/28/15 0112  INR 0.98  APTT 27     BMP:  Recent Labs  09/28/15 0112 09/29/15 0420 09/30/15 0350 10/01/15 0515  NA 142 142 142 141  K 4.4 4.5 5.2* 4.0  CL 110 110 109 108  CO2 26 24 21* 23  GLUCOSE 157* 144* 166* 248*  BUN 26* 20 25* 29*  CALCIUM 9.1 8.5* 8.8* 8.6*  CREATININE 1.55* 1.43* 1.28* 1.14*  GFRNONAA 35* 39* 45* 51*  GFRAA 41* 45* 52* 59*    LIVER FUNCTION TESTS:  Recent Labs  09/28/15 0112  BILITOT 0.4  AST 26  ALT 16  ALKPHOS 94  PROT 7.6  ALBUMIN 4.1    Assessment and Plan:  CVA L MCA clot retrieval 2/2 in IR No response---on vent  Electronically Signed: Demeisha Geraghty A 10/01/2015, 9:31 AM   I spent a total of 15 Minutes at the the patient's bedside AND on the patient's hospital floor or unit, greater than 50% of which was counseling/coordinating care for CVA/L MCA clot retrieval

## 2015-10-02 ENCOUNTER — Inpatient Hospital Stay (HOSPITAL_COMMUNITY): Payer: Medicaid Other

## 2015-10-02 LAB — GLUCOSE, CAPILLARY
GLUCOSE-CAPILLARY: 277 mg/dL — AB (ref 65–99)
Glucose-Capillary: 210 mg/dL — ABNORMAL HIGH (ref 65–99)
Glucose-Capillary: 242 mg/dL — ABNORMAL HIGH (ref 65–99)
Glucose-Capillary: 249 mg/dL — ABNORMAL HIGH (ref 65–99)
Glucose-Capillary: 252 mg/dL — ABNORMAL HIGH (ref 65–99)
Glucose-Capillary: 262 mg/dL — ABNORMAL HIGH (ref 65–99)

## 2015-10-02 LAB — BLOOD GAS, ARTERIAL
Acid-base deficit: 2.7 mmol/L — ABNORMAL HIGH (ref 0.0–2.0)
Bicarbonate: 21.2 meq/L (ref 20.0–24.0)
Drawn by: 44166
FIO2: 0.3
MECHVT: 490 mL
O2 Saturation: 95 %
PEEP: 5 cmH2O
Patient temperature: 99
RATE: 20 {breaths}/min
TCO2: 22.2 mmol/L (ref 0–100)
pCO2 arterial: 34.2 mmHg — ABNORMAL LOW (ref 35.0–45.0)
pH, Arterial: 7.411 (ref 7.350–7.450)
pO2, Arterial: 78 mmHg — ABNORMAL LOW (ref 80.0–100.0)

## 2015-10-02 LAB — BASIC METABOLIC PANEL
Anion gap: 9 (ref 5–15)
BUN: 42 mg/dL — AB (ref 6–20)
CALCIUM: 8.5 mg/dL — AB (ref 8.9–10.3)
CO2: 22 mmol/L (ref 22–32)
Chloride: 111 mmol/L (ref 101–111)
Creatinine, Ser: 1.36 mg/dL — ABNORMAL HIGH (ref 0.44–1.00)
GFR calc Af Amer: 48 mL/min — ABNORMAL LOW (ref >60)
GFR, EST NON AFRICAN AMERICAN: 41 mL/min — AB (ref >60)
Glucose, Bld: 290 mg/dL — ABNORMAL HIGH (ref 65–99)
POTASSIUM: 4.4 mmol/L (ref 3.5–5.1)
SODIUM: 142 mmol/L (ref 135–145)

## 2015-10-02 LAB — CBC
HCT: 29.7 % — ABNORMAL LOW (ref 36.0–46.0)
Hemoglobin: 9.8 g/dL — ABNORMAL LOW (ref 12.0–15.0)
MCH: 31 pg (ref 26.0–34.0)
MCHC: 33 g/dL (ref 30.0–36.0)
MCV: 94 fL (ref 78.0–100.0)
Platelets: 275 K/uL (ref 150–400)
RBC: 3.16 MIL/uL — ABNORMAL LOW (ref 3.87–5.11)
RDW: 14.2 % (ref 11.5–15.5)
WBC: 11.3 K/uL — ABNORMAL HIGH (ref 4.0–10.5)

## 2015-10-02 LAB — PHOSPHORUS: Phosphorus: 4 mg/dL (ref 2.5–4.6)

## 2015-10-02 LAB — MAGNESIUM: Magnesium: 2 mg/dL (ref 1.7–2.4)

## 2015-10-02 MED ORDER — INSULIN ASPART 100 UNIT/ML ~~LOC~~ SOLN: 3.0000 [IU] | SUBCUTANEOUS | Status: DC

## 2015-10-02 MED ORDER — INSULIN ASPART 100 UNIT/ML ~~LOC~~ SOLN
3.0000 [IU] | SUBCUTANEOUS | Status: DC
  Administered 2015-10-02 – 2015-10-03 (×6): 3 [IU] via INTRAVENOUS

## 2015-10-02 NOTE — Progress Notes (Signed)
PULMONARY / CRITICAL CARE MEDICINE   Name: Linda [name] MRN: [id number] DOB: Apr 17, 1956    ADMISSION DATE:  09/28/2015 CONSULTATION DATE:  2/2  REFERRING MD:  Neuro  CHIEF COMPLAINT:  Obtunded   HISTORY OF PRESENT ILLNESS:   60 yo AAF with hx of stroke 2012 that required trach/peg and with residual right side weakness was seen at [facility] after falling off a couch. Code stroke called per EDP and transferred to [facility] for IR procedure for revascularization of of left MCA. She was then admitted to NSICU and PCCM asked to help manage vent and critical care issues. She remains intubated and sedated on arrival to ICU. Goals of extubation will be clarified with neuro.   SUBJECTIVE:  Not arousable, not waking up, restless in bed needing precedex, no further events overnight.   VITAL SIGNS: BP 94/59 mmHg  Pulse 73  Temp(Src) 98 F (36.7 C) (Axillary)  Resp 30  Ht  (1.702 m)  Wt 106.3 kg (234 lb 5.6 oz)  BMI 36.70 kg/m2  SpO2 98%  HEMODYNAMICS:    VENTILATOR SETTINGS: Vent Mode:  [-] PRVC FiO2 (%):  [30 %] 30 % Set Rate:  [20 bmp] 20 bmp Vt Set:  [490 mL] 490 mL PEEP:  [5 cmH20-8 cmH20] 5 cmH20 Pressure Support:  [5 cmH20-8 cmH20] 5 cmH20 Plateau Pressure:  [12 cmH20-18 cmH20] 12 cmH20  INTAKE / OUTPUT: I/O last 3 completed shifts: In: 2747.7 [I.V.:1262.7; NG/GT:1485] Out: 2985 [Urine:2985]  PHYSICAL EXAMINATION: General:  Intubated and sedated with precedex Neuro: Withdraws to noxious stimulus but not awake enough to follow commands. HEENT:  ETT-> vent, short neck, pupils pinpoint, unreactive Cardiovascular: ST at 106; S1/S2, no MRG Lungs: Bilateral airflow Abdomen:  Obese, normal BS, S/NT/ND Musculoskeletal: No joint deformities Skin: warm and dry  LABS:  BMET  Recent Labs Lab 09/30/15 0350 10/01/15 0515 10/02/15 0552  NA 142 141 142  K 5.2* 4.0 4.4  CL 109 108 111  CO2 21* 23 22  BUN 25* 29* 42*  CREATININE 1.28* 1.14* 1.36*  GLUCOSE 166* 248* 290*    Electrolytes  Recent Labs Lab 09/30/15 0350 10/01/15 0515 10/02/15 0552  CALCIUM 8.8* 8.6* 8.5*  MG 1.8 1.8 2.0  PHOS 4.5 3.3 4.0   CBC  Recent Labs Lab 09/30/15 0350 10/01/15 0515 10/02/15 0552  WBC 13.0* 11.2* 11.3*  HGB 11.2* 9.8* 9.8*  HCT 34.9* 29.8* 29.7*  PLT PLATELET CLUMPS NOTED ON SMEAR, UNABLE TO ESTIMATE 250 275   Coag's  Recent Labs Lab 09/28/15 0112  APTT 27  INR 0.98   Sepsis Markers  Recent Labs Lab 09/28/15 0938 09/29/15 0420 09/30/15 0350  LATICACIDVEN 1.5  --   --   PROCALCITON <0.10 0.10 0.10   ABG  Recent Labs Lab 09/28/15 0805 10/01/15 0358 10/02/15 0430  PHART 7.364 7.453* 7.411  PCO2ART 40.1 34.6* 34.2*  PO2ART 304* 105.0* 78.0*   Liver Enzymes  Recent Labs Lab 09/28/15 0112  AST 26  ALT 16  ALKPHOS 94  BILITOT 0.4  ALBUMIN 4.1   Cardiac Enzymes  Recent Labs Lab 09/28/15 0112  TROPONINI <0.03   Glucose  Recent Labs Lab 10/01/15 1136 10/01/15 1554 10/01/15 2017 10/01/15 2324 10/02/15 0408 10/02/15 0811  GLUCAP 206* 208* 238* 259* 252* 249*   Imaging Dg Chest Port 1 View  10/02/2015  CLINICAL DATA:  Intubated patient, status post CVA, acute respiratory failure, fever and elevated white blood cell count. EXAM: PORTABLE CHEST 1 VIEW COMPARISON:  Portable chest  x-ray of September 30, 2005. FINDINGS: The lungs are adequately inflated. There is no focal infiltrate. The interstitial markings are mildly prominent and more conspicuous today. The heart is normal in size. The pulmonary vascularity is not clearly engorged. The endotracheal tube tip lies 2.7 cm above the carina. The esophagogastric tube tip projects below the inferior margin of the image. The observed bony thorax is unremarkable. IMPRESSION: Slight increase conspicuity of the pulmonary interstitium which may reflect mild interstitial edema or less likely interstitial pneumonia. The support tubes are in reasonable position. Electronically Signed   By: David   [place] M.D.   On: 10/02/2015 07:36   STUDIES:  Echo 02/20>> EEG 02/20: EEG is consistent with a sleep/sedation; possible suggestion of a left central cerebral dysfunction  CULTURES: 2/2 bc x 2>> 2/2 uc>> 2/2 sputum>>GRAM POSITIVE COCCI  IN PAIRS, IN CHAINS, IN CLUSTERS  AND FEW GRAM NEGATIVE RODS   ANTIBIOTICS: None  SIGNIFICANT EVENTS: Code stroke 2/2  LINES/TUBES: 2/2 OTT>> 2/2 rt femoral sheath>> 2/3 left rad a line>>  DISCUSSION: 60 yo AAF hx of stroke and new neurological event + cocaine. Now s/p interventional procedure for L MCA occlusion.   ASSESSMENT / PLAN:  PULMONARY A: VDRF post IR procedure on 2/2 P:   PS trials but no extubation given mental status. Titrate O2 for sat of 88-92%. Will need to discuss trach/peg vs withdrawal with family towards the end of the week.  CARDIOVASCULAR A:  Hx of HTN, exacerbated by cocaine use.  P:  Off cardene GTT. BP parameters per neurology. Hydralazine prn to maintain SBP<160. Echocardiogram 55-60%.  RENAL Lab Results  Component Value Date   CREATININE 1.36* 10/02/2015   CREATININE 1.14* 10/01/2015   CREATININE 1.28* 09/30/2015   CREATININE 1.27 02/28/2014   CREATININE 1.15 04/10/2012   CREATININE 1.32* 11/13/2011   A:   ARI, likely related to HTN, volume status and dye load for procedure; slight improvement.  P:   KVO IVF. Follow BMP. Replace electrolytes as indicated.  GASTROINTESTINAL A:   Hx of  Dysphagia with Peg in 2012 P:   TF. PPI.  HEMATOLOGIC A:   No acute issues P:  PAS  INFECTIOUS A:   No immediate concern for infection P:   Monitor infection parameters   ENDOCRINE A:   Hyperglycemia without h/o diabetes P:   SSI and Blood glucose monitoring per ICU protocol Add novolog 3 units q4 hours.  NEUROLOGIC A:   Post Left MCA recannulization of inferior division 2/2 Hx of hemorraghic stroke 2012 with residual rt side weakness. Required trach/peg in 2012 Substance abuse/ + for  cocaine    P:   D/C versed drip. D/C fentanyl drip. PRN fentanyl. Precedex drip.  FAMILY  - Updates: No family at bedside.  - Inter-disciplinary family meet or Palliative Care meeting due by: 10/05/15  The patient is critically ill with multiple organ systems failure and requires high complexity decision making for assessment and support, frequent evaluation and titration of therapies, application of advanced monitoring technologies and extensive interpretation of multiple databases.   Critical Care Time devoted to patient care services described in this note is  35  Minutes. This time reflects time of care of this signee Dr Koren Bound. This critical care time does not reflect procedure time, or teaching time or supervisory time of PA/NP/Med student/Med Resident etc but could involve care discussion time.  Alyson Reedy, M.D. [facility] Pulmonary/Critical Care Medicine. Pager: [phone]. After hours pager: [phone].  10/02/2015, 9:44 AM

## 2015-10-02 NOTE — Progress Notes (Signed)
abg collected  

## 2015-10-02 NOTE — Progress Notes (Signed)
Inpatient Diabetes Program Recommendations  AACE/ADA: New Consensus Statement on Inpatient Glycemic Control (2015)  Target Ranges:  Prepandial:   less than 140 mg/dL      Peak postprandial:   less than 180 mg/dL (1-2 hours)      Critically ill patients:  140 - 180 mg/dL   Results for ZARAHI, FUERST (MRN [id number]) as of 10/02/2015 08:55  Ref. Range 10/01/2015 07:43 10/01/2015 11:36 10/01/2015 15:54 10/01/2015 20:17 10/01/2015 23:24 10/02/2015 04:08  Glucose-Capillary Latest Ref Range: 65-99 mg/dL 536 (H) 644 (H) 034 (H) 238 (H) 259 (H) 252 (H)   Review of Glycemic Control  Diabetes history: DM 2 Outpatient Diabetes medications: Lantus 60 units QPM Current orders for Inpatient glycemic control: Novolog Sensitive Q4hrs  Inpatient Diabetes Program Recommendations: Insulin - Tube Feed Coverage: Glucose in 200's patient is on Vital HP at 45 /hr, may want to consider 3 units of Tube Feed Coverage Q4hrs.  Thanks,  Christena Deem RN, MSN, [facility] Inpatient Diabetes Coordinator Team Pager [phone] (8a-5p)

## 2015-10-02 NOTE — Progress Notes (Signed)
SLP Cancellation Note  Patient Details Name: Linda [name] MRN: [id number] DOB: 06-13-1956   Cancelled treatment:       Reason Eval/Treat Not Completed: Patient not medically ready. Will sign off, please reorder when pt is medically ready.    Wanda Cellucci, Riley Nearing 10/02/2015, 7:44 AM

## 2015-10-02 NOTE — Progress Notes (Signed)
Pt's niece Garlon Hatchet called: she and pt's sister Zahraa Bhargava have discussed options following their conversation earlier today with Dr. Pearlean Brownie, and wish to proceed with trach and PEG when these can be arranged.

## 2015-10-02 NOTE — Progress Notes (Signed)
PT Cancellation Note  Patient Details Name: Linda [name] MRN: [id number] DOB: 24-Mar-1956   Cancelled Treatment:    Reason Eval/Treat Not Completed: Patient not medically ready; pt continues with issues limiting ability to participate in PT.  Spoke with RN, will sign off and await new orders when able to participate in PT.  Thanks   [name] 10/02/2015, 1:47 PM  [name], [place] [phone] 10/02/2015

## 2015-10-02 NOTE — Progress Notes (Signed)
Referring Physician(s): Dr Roda Shutters  Chief Complaint:  CVA L MCA revascularization 2/2 in IR  Subjective:  On vent Moving B arms and left leg involuntarily Does not follow commands +BC GPC   Allergies: Review of patient's allergies indicates no known allergies.  Medications: Prior to Admission medications   Medication Sig Start Date End Date Taking? Authorizing Provider  acetaminophen (TYLENOL) 500 MG tablet Take 500 mg by mouth every 8 (eight) hours as needed. pain   Yes Historical Provider, MD  albuterol (PROVENTIL HFA;VENTOLIN HFA) 108 (90 Base) MCG/ACT inhaler Inhale 1 puff into the lungs every 6 (six) hours as needed for wheezing or shortness of breath.   Yes Historical Provider, MD  amitriptyline (ELAVIL) 50 MG tablet Take 75 mg by mouth at bedtime.   Yes Historical Provider, MD  amLODipine (NORVASC) 10 MG tablet Take 10 mg by mouth daily.   Yes Historical Provider, MD  atorvastatin (LIPITOR) 40 MG tablet Take 80 mg by mouth daily.   Yes Historical Provider, MD  citalopram (CELEXA) 20 MG tablet Take 20 mg by mouth daily.   Yes Historical Provider, MD  clonazePAM (KLONOPIN) 0.5 MG tablet Take 0.5 mg by mouth 2 (two) times daily as needed. For anxiety   Yes Historical Provider, MD  ergocalciferol (VITAMIN D2) 50000 units capsule Take 50,000 Units by mouth once a week.   Yes Historical Provider, MD  Insulin Glargine (LANTUS SOLOSTAR) 100 UNIT/ML Solostar Pen Inject 60 Units into the skin every evening.   Yes Historical Provider, MD  lisinopril-hydrochlorothiazide (PRINZIDE,ZESTORETIC) 20-12.5 MG tablet Take 1 tablet by mouth daily.   Yes Historical Provider, MD  pantoprazole (PROTONIX) 40 MG tablet Take 40 mg by mouth daily. Reported on 09/28/2015   Yes Historical Provider, MD     Vital Signs: BP 94/60 mmHg  Pulse 89  Temp(Src) 101.4 F (38.6 C) (Axillary)  Resp 20  Ht  (1.702 m)  Wt 234 lb 5.6 oz (106.3 kg)  BMI 36.70 kg/m2  SpO2 99%  Physical Exam  Eyes:    Tries to open eyes today No focus  Pulmonary/Chest:  vent  Abdominal: Soft.  Musculoskeletal:  Moves arms and L leg involuntarily No movement of Rt leg noted Does not follow commands   Skin: Skin is warm.  Nursing note and vitals reviewed.   Imaging: Ct Head Wo Contrast  09/29/2015  CLINICAL DATA:  Followup LEFT M2 occlusion treated endovascularly. EXAM: CT HEAD WITHOUT CONTRAST TECHNIQUE: Contiguous axial images were obtained from the base of the skull through the vertex without intravenous contrast. COMPARISON:  MRI of the head September 28, 2015 and CT head September 28, 2015 FINDINGS: Large wedge-like hypodensities LEFT frontal, temporal, parietal and occipital lobes and same distribution as reduced diffusion on yesterday's MRI. Faint density within LEFT posterior frontal lobe residual contrast staining without lobar hematoma. Multiple tiny densities LEFT frontal LEFT frontal lobe favoring residual intravascular contrast, unchanged from recent CT. Worsening mass-effect with 3 mm LEFT-to-RIGHT midline shift, new effacement of the LEFT atrium without ventricular entrapment. Small bilateral basal ganglia lacunar infarcts. Patchy to confluent white matter changes compatible with chronic small vessel ischemic disease. No abnormal extra-axial fluid collection. Basal cisterns are patent. Moderate calcific atherosclerosis the carotid siphons. Mild paranasal sinus mucosal thickening. Mastoid air cells are well aerated. Ocular globes and orbital contents are normal. No skull fracture. IMPRESSION: Evolving large LEFT MCA territory infarct with small amount of residual contrast staining, no hemorrhagic conversion. Increasing edema and mass effect with 3  mm LEFT-to-RIGHT midline shift, no ventricular entrapment. Old bilateral basal ganglia lacunar infarcts. Moderate to severe chronic small vessel ischemic disease. Electronically Signed   By: Awilda Metro M.D.   On: 09/29/2015 05:44   Mr Brain Wo  Contrast  09/28/2015  CLINICAL DATA:  60 year old hypertensive female with history of hepatitis-C and cocaine abuse presenting unresponsive with left middle cerebral artery stroke treated endovascularly. Subsequent encounter. EXAM: MRI HEAD WITHOUT CONTRAST TECHNIQUE: Multiplanar, multiecho pulse sequences of the brain and surrounding structures were obtained without intravenous contrast. COMPARISON:  09/28/2015 head CT. FINDINGS: Large acute hemorrhagic left hemispheric infarct involves the majority of the left middle cerebral artery distribution (left frontal lobe, left parietal lobe, left temporal lobe, left corona radiata and posterior left operculum region). Hemorrhage is most notable involving the left posterior temporal -parietal aspect of this acute infarct. Local mass effect upon the left lateral ventricle. At most there is minimal bowing of the septum to the right. Several small acute nonhemorrhagic right hemispheric infarcts involving right caudate head, superior aspect of the post limb right internal capsule, right frontal lobe, superior right lenticular nucleus and right occipital lobe. Several small acute nonhemorrhagic cerebellar infarcts greater on the right. Remote left lenticular nucleus infarct or hematoma with blood-stained cleft now noted. Moderate to marked chronic small vessel disease changes. Atrophy without hydrocephalus. No intracranial mass lesion noted on this unenhanced exam. Scattered blood breakdown products right cerebellum and pons consistent with prior episodes hemorrhagic ischemia. Major intracranial vascular structures are patent. Slightly heterogeneous bone marrow of the clivus and upper cervical spine without discrete mass identified. Stability can be confirmed on follow-up. Mild exophthalmos.  Cervical medullary junction unremarkable. IMPRESSION: Large acute hemorrhagic left hemispheric infarct involves the majority of the left middle cerebral artery distribution. Hemorrhage is  most notable involving the left posterior temporal -parietal aspect of this acute infarct. Local mass effect upon the left lateral ventricle. At most there is minimal bowing of the septum to the right. Several small acute nonhemorrhagic right hemispheric infarcts involving right caudate head, superior aspect of the post limb right internal capsule, right frontal lobe, superior right lenticular nucleus and right occipital lobe. Several small acute nonhemorrhagic cerebellar infarcts greater on the right. Remote left lenticular nucleus infarct or hematoma with blood-stained cleft now noted. Moderate to marked chronic small vessel disease changes. Atrophy. Electronically Signed   By: Lacy Duverney M.D.   On: 09/28/2015 13:16   Dg Chest Port 1 View  10/02/2015  CLINICAL DATA:  Intubated patient, status post CVA, acute respiratory failure, fever and elevated white blood cell count. EXAM: PORTABLE CHEST 1 VIEW COMPARISON:  Portable chest x-ray of September 30, 2005. FINDINGS: The lungs are adequately inflated. There is no focal infiltrate. The interstitial markings are mildly prominent and more conspicuous today. The heart is normal in size. The pulmonary vascularity is not clearly engorged. The endotracheal tube tip lies 2.7 cm above the carina. The esophagogastric tube tip projects below the inferior margin of the image. The observed bony thorax is unremarkable. IMPRESSION: Slight increase conspicuity of the pulmonary interstitium which may reflect mild interstitial edema or less likely interstitial pneumonia. The support tubes are in reasonable position. Electronically Signed   By: David  [place] M.D.   On: 10/02/2015 07:36   Dg Chest Port 1 View  10/01/2015  CLINICAL DATA:  Endotracheal tube EXAM: PORTABLE CHEST 1 VIEW COMPARISON:  09/30/2015 FINDINGS: Endotracheal tube 4 cm from carina. Stable cardiac silhouette with ectatic aorta. Lungs are clear. No pneumothorax.  IMPRESSION: Endotracheal tube in good position.   Lungs are clear. Electronically Signed   By: Genevive Bi M.D.   On: 10/01/2015 07:52   Dg Chest Port 1 View  09/30/2015  CLINICAL DATA:  Ventilator dependence. EXAM: PORTABLE CHEST 1 VIEW COMPARISON:  09/29/2015 FINDINGS: Endotracheal tube terminates in the distal trachea near the right mainstem bronchus orifice. Lung volumes are unchanged. Cardiomediastinal silhouette is unchanged. Minimal left basilar atelectasis is unchanged. No overt pulmonary edema, sizable pleural effusion, or pneumothorax is identified. IMPRESSION: 1. Endotracheal tube tip near the right mainstem bronchus orifice. Recommend retracting 2.5 cm. 2. Minimal left basilar atelectasis. These results were called by telephone at the time of interpretation on 09/30/2015 at 10:09 am to patient's ICU nurse Josh, who verbally acknowledged these results. Electronically Signed   By: Sebastian Ache M.D.   On: 09/30/2015 10:11   Dg Chest Port 1 View  09/29/2015  CLINICAL DATA:  Respiratory failure EXAM: PORTABLE CHEST 1 VIEW COMPARISON:  09/28/2015 FINDINGS: Endotracheal tube terminates 1.5 cm above the carina. Mild patchy opacity at the lung bases, likely atelectasis. No focal consolidation. No pleural effusion or pneumothorax. The heart is normal in size. Enteric tube courses into the stomach. IMPRESSION: Endotracheal tube terminates 1.5 cm above the carina. Electronically Signed   By: Charline Bills M.D.   On: 09/29/2015 10:45   Dg Abd Portable 1v  09/30/2015  CLINICAL DATA:  Orogastric tube placement. Orogastric tube removed and replaced. EXAM: PORTABLE ABDOMEN - 1 VIEW COMPARISON:  Earlier this day at 2248 hour FINDINGS: Tip and side port of the enteric tube below the diaphragm in the stomach. There is a normal bowel gas pattern. IMPRESSION: Tip and side port of the enteric tube below the diaphragm in the stomach. Electronically Signed   By: Rubye Oaks M.D.   On: 09/30/2015 23:18   Dg Abd Portable 1v  09/30/2015  CLINICAL DATA:   Encounter for OG tube placement. EXAM: PORTABLE ABDOMEN - 1 VIEW COMPARISON:  Earlier this day at 2152 hour FINDINGS: Tip and side port of the enteric tube are below the diaphragm in the stomach. And tip is directed cranially. There is a normal bowel gas pattern. IMPRESSION: Tip and side port of the enteric tube below the diaphragm in the stomach, tip directed cranially. Electronically Signed   By: Rubye Oaks M.D.   On: 09/30/2015 23:14   Dg Abd Portable 1v  09/30/2015  CLINICAL DATA:  Orogastric tube placement EXAM: PORTABLE ABDOMEN - 1 VIEW COMPARISON:  09/29/2015. FINDINGS: Gastric tube is coiled in the stomach with the tip directed towards fundus. Normal bowel gas pattern. IMPRESSION: Gastric tube coiled in the stomach.  Normal bowel gas pattern. Electronically Signed   By: Marlan Palau M.D.   On: 09/30/2015 22:26   Dg Abd Portable 1v  09/29/2015  CLINICAL DATA:  Orogastric tube placement. EXAM: PORTABLE ABDOMEN - 1 VIEW COMPARISON:  Abdominal radiograph of June 04, 2011 FINDINGS: In orogastric tube is in place with the tip in the region of the pylorus or proximal duodenum. The stomach does not appear distended with gas. The small and large bowel gas pattern is normal. The lung bases are clear. IMPRESSION: The orogastric tube tip lies in the distal stomach or duodenal bulb. Electronically Signed   By: David  [place] M.D.   On: 09/29/2015 07:41    Labs:  CBC:  Recent Labs  09/29/15 0420 09/30/15 0350 10/01/15 0515 10/02/15 0552  WBC 11.3* 13.0* 11.2* 11.3*  HGB 11.3*  11.2* 9.8* 9.8*  HCT 34.5* 34.9* 29.8* 29.7*  PLT 288 PLATELET CLUMPS NOTED ON SMEAR, UNABLE TO ESTIMATE 250 275    COAGS:  Recent Labs  09/28/15 0112  INR 0.98  APTT 27    BMP:  Recent Labs  09/29/15 0420 09/30/15 0350 10/01/15 0515 10/02/15 0552  NA 142 142 141 142  K 4.5 5.2* 4.0 4.4  CL 110 109 108 111  CO2 24 21* 23 22  GLUCOSE 144* 166* 248* 290*  BUN 20 25* 29* 42*  CALCIUM 8.5* 8.8* 8.6*  8.5*  CREATININE 1.43* 1.28* 1.14* 1.36*  GFRNONAA 39* 45* 51* 41*  GFRAA 45* 52* 59* 48*    LIVER FUNCTION TESTS:  Recent Labs  09/28/15 0112  BILITOT 0.4  AST 26  ALT 16  ALKPHOS 94  PROT 7.6  ALBUMIN 4.1    Assessment and Plan:  CVA L MCA revascularization 2/2 in IR follow  Electronically Signed: Quinn Bartling A 10/02/2015, 8:28 AM   I spent a total of 15 Minutes at the the patient's bedside AND on the patient's hospital floor or unit, greater than 50% of which was counseling/coordinating care for CVA/ L MCA clot retrieval

## 2015-10-02 NOTE — Progress Notes (Signed)
OT Cancellation Note  Patient Details Name: Linda [name] MRN: [id number] DOB: [birth year]   Cancelled Treatment:    Reason Eval/Treat Not Completed: Patient not medically ready.  Pt intubated, not following commands or responsive per RN.  Will reattempt.   Angelene Giovanni [place], OTR/L [phone]  10/02/2015, 11:12 AM

## 2015-10-02 NOTE — Progress Notes (Signed)
STROKE TEAM PROGRESS NOTE   SUBJECTIVE (INTERVAL HISTORY) No family  at the bedside. Currently on versed drip. Patient had no documented events overnight.  Remains intubated.  She does not follow commands.    OBJECTIVE Temp:  [98 F (36.7 C)-103 F (39.4 C)] 98 F (36.7 C) (02/06 0800) Pulse Rate:  [73-115] 76 (02/06 1000) Cardiac Rhythm:  [-] Normal sinus rhythm (02/06 1000) Resp:  [4-43] 22 (02/06 1000) BP: (87-177)/(49-85) 104/62 mmHg (02/06 1000) SpO2:  [97 %-100 %] 97 % (02/06 1000) Arterial Line BP: (85-161)/(50-98) 110/56 mmHg (02/06 1000) FiO2 (%):  [30 %] 30 % (02/06 1000) Weight:  [234 lb 5.6 oz (106.3 kg)] 234 lb 5.6 oz (106.3 kg) (02/06 0500)  CBC:   Recent Labs Lab 09/28/15 0112  09/29/15 0420  10/01/15 0515 10/02/15 0552  WBC 18.7*  < > 11.3*  < > 11.2* 11.3*  NEUTROABS 2.6  --  6.1  --   --   --   HGB 12.0  < > 11.3*  < > 9.8* 9.8*  HCT 35.8  < > 34.5*  < > 29.8* 29.7*  MCV 92.5  < > 93.0  < > 94.6 94.0  PLT 276  < > 288  < > 250 275  < > = values in this interval not displayed.  Basic Metabolic Panel:   Recent Labs Lab 10/01/15 0515 10/02/15 0552  NA 141 142  K 4.0 4.4  CL 108 111  CO2 23 22  GLUCOSE 248* 290*  BUN 29* 42*  CREATININE 1.14* 1.36*  CALCIUM 8.6* 8.5*  MG 1.8 2.0  PHOS 3.3 4.0    Lipid Panel:     Component Value Date/Time   CHOL 170 09/28/2015 0938   TRIG 93 10/01/2015 0850   HDL 38* 09/28/2015 0938   CHOLHDL 4.5 09/28/2015 0938   VLDL UNABLE TO CALCULATE IF TRIGLYCERIDE OVER 400 mg/dL 16/05/9603 5409   LDLCALC UNABLE TO CALCULATE IF TRIGLYCERIDE OVER 400 mg/dL 81/19/1478 2956   OZHY8M:  Lab Results  Component Value Date   HGBA1C 6.9* 09/28/2015   Urine Drug Screen:     Component Value Date/Time   LABOPIA NONE DETECTED 09/28/2015 0226   LABBENZ NONE DETECTED 09/28/2015 0226   AMPHETMU NONE DETECTED 09/28/2015 0226   THCU NONE DETECTED 09/28/2015 0226   LABBARB NONE DETECTED 09/28/2015 0226      IMAGING I  have personally reviewed the radiological images below and agree with the radiology interpretations.  Ct Head Wo Contrast 09/28/2015  1. New calcific density in the left sylvian fissure, possible calcific distal M2 embolus. No visible acute infarct or hemorrhage. 2. Advanced chronic small vessel disease with multiple lacunar infarcts.   CTA NECK 09/28/2015   Moderately motion degraded examination, limited by LEFT venous contamination. No definite acute vascular process or hemodynamically significant stenosis. 3.5 x 5.7 cm dominant LEFT thyroid not oral for which follow up thyroid sonogram is recommended on a nonemergent basis.   CTA HEAD 09/28/2015   LEFT M2 segment occlusion corresponding to sylvian fissure density on today's CT, likely acute. Complete circle of Willis.  Mild intracranial atherosclerosis. Moderate calcific atherosclerosis the carotid siphons.   Cerebral Angiogram 09/28/2015 S/P Lt internal carotid arteriogram followed by complete revascularization of occluded dominant inferior division of LT MCA with x pass with trevoprovue 4 mmx 30 mm retrieval device and 6 mg of superselective Integrelin achieving a TICI 3 flow.  MRI  Large acute hemorrhagic left hemispheric infarct involves the majority of the  left middle cerebral artery distribution. Hemorrhage is most notable involving the left posterior temporal -parietal aspect of this acute infarct. Local mass effect upon the left lateral ventricle. At most there is minimal bowing of the septum to the right. Several small acute nonhemorrhagic right hemispheric infarcts involving right caudate head, superior aspect of the post limb right internal capsule, right frontal lobe, superior right lenticular nucleus and right occipital lobe. Several small acute nonhemorrhagic cerebellar infarcts greater on the right. Remote left lenticular nucleus infarct or hematoma with blood-stained cleft now noted. Moderate to marked chronic small vessel  disease changes. Atrophy.  EEG Abnormalities: 1) possible attenuation of sleep structures in the left central region 2) lack of waking state Clinical Interpretation: This EEG is consistent with a sleep/sedation EEG with a possible suggestion of a left central cerebral dysfunction, though I don't think this is definite by this study.  2D echo - Systolic function was normal. The estimated ejection fraction was in the range of 55% to 60%. Wall motion was normal; there were no regional wall motion abnormalities. Doppler parameters are consistent with abnormal left ventricular relaxation (grade 1 diastolic dysfunction).  LE venous doppler - preliminary report no DVT    Physical exam  Temp:  [98 F (36.7 C)-103 F (39.4 C)] 98 F (36.7 C) (02/06 0800) Pulse Rate:  [73-115] 76 (02/06 1000) Resp:  [4-43] 22 (02/06 1000) BP: (87-177)/(49-85) 104/62 mmHg (02/06 1000) SpO2:  [97 %-100 %] 97 % (02/06 1000) Arterial Line BP: (85-161)/(50-98) 110/56 mmHg (02/06 1000) FiO2 (%):  [30 %] 30 % (02/06 1000) Weight:  [234 lb 5.6 oz (106.3 kg)] 234 lb 5.6 oz (106.3 kg) (02/06 0500)  General - obese, well developed, intubated; versed gtt  HEENT:  NCAT; sclera slightly icteric; copious oral secretions Cardiovascular - Regular rate and rhythm. Pulm:  Scattered rhonchi Abd:  Obese; ND, normal bowel sounds Ext:  No C/C/E  Neurologic Exam MENTAL STATUS: Intubated and on versed Not responsive to voice, does not open eyes.  Pupils 3mm, poorly reactive to light, slight doll's eye with head movement, weak corneal and strong gag reflexes, breathing over the vent.   MOTOR/SENSORY: On painful stimulation, she has weak withdrawal of right UE and no response in the right LE; she spontaneously moves the left UE and LE.Tone diminished on right and normal on left  Coordination or gait can not able to test.   ASSESSMENT/PLAN Linda [name] is a 60 y.o. female with history of left BG ICH in  2012, hypertension, cocaine abuse, renal failure and hepatitis C presenting with altered mental status, right side weakness and leftward gaze. Also concerning for possible seizure activity initial presentation. She did not receive IV t-PA due to hx ICH, CTA showed a L M2 occlusion, she was sent to IR where she received complete TICI3 revascularization of the L MCA with mechanical thrombectomy with trevo and IA Integrilin.  Stroke:  left MCA infarct with hemorrhagic transformation s/p revascularizationf of L M2, multifocal scattered infarcts involving bilateral posterior and anterior circulation, consistent with cardioembolic source although pt does have significant large vessel intracranial atherosclerosis.  Resultant  Intubated and unresponsive  MRI - multifocal scattered infarcts involving bilateral posterior and anterior circulation, largest at left MCA territory with patchy hemorrhagic transformation on MRI but not on CT  Repeat CT - left MCA hemorrhagic infarct   CTA head L M2 occlusion, b/l ICA supraclinoid segment significant atherosclerosis, L>R with moderate to severe stenosis.  CTA neck L thyroid nodule  2D Echo  Results above  LE venous doppler preliminary negative  LDL unable to calculate due to high TG   HgbA1c 6.9  UDS positive for cocaine  Heparin subq for VTE prophylaxis Diet NPO time specified  No antithrombotic prior to admission, now on ASA 325mg .   Ongoing aggressive stroke risk factor management  Therapy recommendations:  pending   Disposition:  pending   ? Seizure  Questionable seizure presentation initially as reported in chart  EEG showed no seizure but under sedation  Hold off AEDs at this time  on versed drip  Hx of ICH  2012 left BG ICH; during hospital course:  BP high at 200s  Urine positive with cocaine   S/p trach and PEG at that time  Lost follow up since  Acute respiratory failure  Intubated in ED due to airway  protection  On versed with ventilation  CCM following  Hypertensive Emergency  BP as high as 202/124  off cardene drip  Stable now with tight parameters post IR < 160  Hyperlipidemia  Home meds:  No statin  LDL not able to calculate due to high TG , goal < 70  Total chol 170  Consider statin once po access.  Cocaine abuse  urine positive both in 2012 and this admission  Likely one of the causes of intracranial vascular stenosis  Need cessation counseling   Other Stroke Risk Factors  Obesity  Other Active Problems  Hepatitis C  Hx Dysphagia secondary to ICH in 2012 with PEG  Leukocytosis and fever:  Fever Tm 101.3; WBCs 18.7->11.3-> 13  Hospital day # 4  This patient is critically ill due to large left MCA infarct with left M2 occlusion s/p intervention, hemorrhagic transformation and at significant risk of neurological worsening, death form recurrent infarcts, increased ICH, brain herniation, cerebral edema, heart failure. This patient's care requires constant monitoring of vital signs, hemodynamics, respiratory and cardiac monitoring, review of multiple databases, neurological assessment, discussion with family, other specialists and medical decision making of high complexity. I spent 35 minutes of neurocritical care time in the care of this patient.    Creatinine slightly worse -1.36  Hyperkalemia improved-4.4 today  Leukocytosis improving; ; CXR cleared  Elevated TGs and blood sugars.  CCM following  Prognosis is quite poor and need to discuss with family goals of care as she likely will need trach, peg, prolonged nursing home placement. Palliative care/comfort care may be appropriate given her poor baseline   Delia Heady, MD Stroke Neurology 10/02/2015 10:16 AM      To contact Stroke Continuity provider, please refer to [email or web address]. After hours, contact General Neurology

## 2015-10-03 ENCOUNTER — Inpatient Hospital Stay (HOSPITAL_COMMUNITY): Payer: Medicaid Other

## 2015-10-03 DIAGNOSIS — R509 Fever, unspecified: Secondary | ICD-10-CM

## 2015-10-03 DIAGNOSIS — I63032 Cerebral infarction due to thrombosis of left carotid artery: Secondary | ICD-10-CM

## 2015-10-03 LAB — CULTURE, BLOOD (ROUTINE X 2)
CULTURE: NO GROWTH
CULTURE: NO GROWTH

## 2015-10-03 LAB — CBC
HEMATOCRIT: 28.4 % — AB (ref 36.0–46.0)
Hemoglobin: 9.4 g/dL — ABNORMAL LOW (ref 12.0–15.0)
MCH: 31.1 pg (ref 26.0–34.0)
MCHC: 33.1 g/dL (ref 30.0–36.0)
MCV: 94 fL (ref 78.0–100.0)
Platelets: 264 10*3/uL (ref 150–400)
RBC: 3.02 MIL/uL — AB (ref 3.87–5.11)
RDW: 14.3 % (ref 11.5–15.5)
WBC: 10.1 10*3/uL (ref 4.0–10.5)

## 2015-10-03 LAB — BLOOD GAS, ARTERIAL
Acid-base deficit: 3.8 mmol/L — ABNORMAL HIGH (ref 0.0–2.0)
BICARBONATE: 20.1 meq/L (ref 20.0–24.0)
Drawn by: 44956
FIO2: 0.3
O2 SAT: 90.1 %
PEEP: 5 cmH2O
Patient temperature: 98.6
RATE: 20 resp/min
TCO2: 21.1 mmol/L (ref 0–100)
VT: 490 mL
pCO2 arterial: 32.4 mmHg — ABNORMAL LOW (ref 35.0–45.0)
pH, Arterial: 7.409 (ref 7.350–7.450)
pO2, Arterial: 60.3 mmHg — ABNORMAL LOW (ref 80.0–100.0)

## 2015-10-03 LAB — MAGNESIUM: Magnesium: 2.1 mg/dL (ref 1.7–2.4)

## 2015-10-03 LAB — GLUCOSE, CAPILLARY
GLUCOSE-CAPILLARY: 250 mg/dL — AB (ref 65–99)
GLUCOSE-CAPILLARY: 194 mg/dL — AB (ref 65–99)
GLUCOSE-CAPILLARY: 272 mg/dL — AB (ref 65–99)
Glucose-Capillary: 211 mg/dL — ABNORMAL HIGH (ref 65–99)
Glucose-Capillary: 215 mg/dL — ABNORMAL HIGH (ref 65–99)
Glucose-Capillary: 218 mg/dL — ABNORMAL HIGH (ref 65–99)

## 2015-10-03 LAB — BASIC METABOLIC PANEL
ANION GAP: 11 (ref 5–15)
BUN: 60 mg/dL — AB (ref 6–20)
CHLORIDE: 113 mmol/L — AB (ref 101–111)
CO2: 21 mmol/L — AB (ref 22–32)
Calcium: 8.5 mg/dL — ABNORMAL LOW (ref 8.9–10.3)
Creatinine, Ser: 1.48 mg/dL — ABNORMAL HIGH (ref 0.44–1.00)
GFR, EST AFRICAN AMERICAN: 43 mL/min — AB (ref >60)
GFR, EST NON AFRICAN AMERICAN: 37 mL/min — AB (ref >60)
GLUCOSE: 282 mg/dL — AB (ref 65–99)
POTASSIUM: 4.2 mmol/L (ref 3.5–5.1)
Sodium: 145 mmol/L (ref 135–145)

## 2015-10-03 LAB — CULTURE, RESPIRATORY

## 2015-10-03 LAB — CULTURE, RESPIRATORY W GRAM STAIN

## 2015-10-03 LAB — PHOSPHORUS: Phosphorus: 3.9 mg/dL (ref 2.5–4.6)

## 2015-10-03 MED ORDER — VANCOMYCIN HCL 10 G IV SOLR
2000.0000 mg | Freq: Once | INTRAVENOUS | Status: AC
  Administered 2015-10-03: 2000 mg via INTRAVENOUS
  Filled 2015-10-03: qty 2000

## 2015-10-03 MED ORDER — VECURONIUM BROMIDE 10 MG IV SOLR: 10.0000 mg | Freq: Once | INTRAVENOUS | Status: DC

## 2015-10-03 MED ORDER — PROPOFOL 500 MG/50ML IV EMUL
5.0000 ug/kg/min | Freq: Once | INTRAVENOUS | Status: DC
  Filled 2015-10-03: qty 50

## 2015-10-03 MED ORDER — ETOMIDATE 2 MG/ML IV SOLN: 40.0000 mg | Freq: Once | INTRAVENOUS | Status: DC

## 2015-10-03 MED ORDER — ETOMIDATE 2 MG/ML IV SOLN
40.0000 mg | Freq: Once | INTRAVENOUS | Status: AC
  Administered 2015-10-04: 20 mg via INTRAVENOUS
  Filled 2015-10-03: qty 20

## 2015-10-03 MED ORDER — INSULIN ASPART 100 UNIT/ML ~~LOC~~ SOLN
5.0000 [IU] | SUBCUTANEOUS | Status: DC
  Administered 2015-10-03 – 2015-10-04 (×3): 5 [IU] via SUBCUTANEOUS

## 2015-10-03 MED ORDER — INSULIN ASPART 100 UNIT/ML ~~LOC~~ SOLN
5.0000 [IU] | SUBCUTANEOUS | Status: DC
  Administered 2015-10-03: 5 [IU] via INTRAVENOUS

## 2015-10-03 MED ORDER — FENTANYL CITRATE (PF) 100 MCG/2ML IJ SOLN
200.0000 ug | Freq: Once | INTRAMUSCULAR | Status: AC
  Administered 2015-10-04: 100 ug via INTRAVENOUS
  Filled 2015-10-03 (×2): qty 4

## 2015-10-03 MED ORDER — VANCOMYCIN HCL 10 G IV SOLR
1250.0000 mg | INTRAVENOUS | Status: DC
  Administered 2015-10-04 – 2015-10-06 (×3): 1250 mg via INTRAVENOUS
  Filled 2015-10-03 (×7): qty 1250

## 2015-10-03 MED ORDER — MIDAZOLAM HCL 2 MG/2ML IJ SOLN: 4.0000 mg | Freq: Once | INTRAMUSCULAR | Status: DC

## 2015-10-03 MED ORDER — INSULIN ASPART 100 UNIT/ML ~~LOC~~ SOLN: 5.0000 [IU] | SUBCUTANEOUS | Status: DC

## 2015-10-03 MED ORDER — FENTANYL CITRATE (PF) 100 MCG/2ML IJ SOLN: 200.0000 ug | Freq: Once | INTRAMUSCULAR | Status: DC

## 2015-10-03 MED ORDER — VECURONIUM BROMIDE 10 MG IV SOLR
10.0000 mg | Freq: Once | INTRAVENOUS | Status: AC
  Administered 2015-10-04: 10 mg via INTRAVENOUS
  Filled 2015-10-03: qty 10

## 2015-10-03 MED ORDER — PANTOPRAZOLE SODIUM 40 MG PO PACK
40.0000 mg | PACK | Freq: Every day | ORAL | Status: DC
  Administered 2015-10-03 – 2015-10-24 (×20): 40 mg
  Filled 2015-10-03 (×21): qty 20

## 2015-10-03 MED ORDER — MIDAZOLAM HCL 2 MG/2ML IJ SOLN
4.0000 mg | Freq: Once | INTRAMUSCULAR | Status: AC
  Administered 2015-10-04: 2 mg via INTRAVENOUS
  Filled 2015-10-03: qty 4

## 2015-10-03 NOTE — Progress Notes (Signed)
Nutrition Follow-up  DOCUMENTATION CODES:   Obesity unspecified  INTERVENTION:  -Continue Vital high protein @ 81m/hr and 30 mL Prostat BID; provides 1280 kcal, 124.5 gm of protein, 903 mL of water -Continue MVI daily  NUTRITION DIAGNOSIS:   Inadequate oral intake related to inability to eat as evidenced by NPO status. -ongoing  GOAL:   Provide needs based on ASPEN/SCCM guidelines -met  MONITOR:   Vent status, TF tolerance, Labs, I & O's  ASSESSMENT:   Pt with history of stroke (had trach/peg) admitted L MCA +cocaine s/p clot retrieval 2/2.  Per MD family deciding on goals of care tach/peg or comfort care. Pt discussed during rounds.  Reviewed medications. Reviewed labs; CBG 206-277. Diabetes coordinator following, recommended starting Lantus, pt was on this at home, and increasing TF coverage.TF regimen provides 141 grams of carbohydrate per day, no need to change TF formula.  Pt with no BM x5 days. Discussed with RN, abdomen continues to be soft.   Noted 9 lb weight gain, pt +3.7 L.   Diet Order:  Diet NPO time specified  Skin:  Reviewed, no issues  Last BM:  unknown  Height:   Ht Readings from Last 1 Encounters:  09/28/15 _0  (1.702 m)    Weight:   Wt Readings from Last 1 Encounters:  10/03/15 240 lb 11.9 oz (109.2 kg)    Ideal Body Weight:  61.3 kg  BMI:  Body mass index is 37.7 kg/(m^2).  Estimated Nutritional Needs:   Kcal:  [address]  Protein:  >/= 122  Fluid:  >/=1.2 L  EDUCATION NEEDS:   No education needs identified at this time  BRaford Pitcher Dietetic Intern Pager: [phone]

## 2015-10-03 NOTE — Progress Notes (Signed)
18:10: Pt very agitated to RASS +2, resp rate 42, vomited large amount of tube feeding + bile-like vomitius,  No  Changes in pupil reactions or other neuro assessments, HR & BP stable.  Large amount of vomitus + secretions  Suction from ETT.  Precedex gtt increased from 0.8 to 1.2 mcg/kg/hour, prn fentanyl IVP given, tube feedings stopped, OGT placed to LIWS.  18:40:  Pt now w/ RASS -3 (typical for pt), resp rate 30, no pupil or neuro assessment changes.  Dr. Belia Heman (CCM) notified of events.

## 2015-10-03 NOTE — Progress Notes (Addendum)
Inpatient Diabetes Program Recommendations  AACE/ADA: New Consensus Statement on Inpatient Glycemic Control (2015)  Target Ranges:  Prepandial:   less than 140 mg/dL      Peak postprandial:   less than 180 mg/dL (1-2 hours)      Critically ill patients:  140 - 180 mg/dL   Results for Linda [name], Linda [name] (MRN [id number]) as of 10/03/2015 10:50  Ref. Range 10/02/2015 08:11 10/02/2015 11:38 10/02/2015 15:40 10/02/2015 19:49 10/02/2015 23:35 10/03/2015 03:59 10/03/2015 08:11  Glucose-Capillary Latest Ref Range: 65-99 mg/dL 409 (H) 811 (H) 914 (H) 242 (H) 210 (H) 250 (H) 211 (H)   Review of Glycemic Control  Diabetes history: DM 2 Outpatient Diabetes medications: Lantus 60 QHS Current orders for Inpatient glycemic control: Novolog Sensitive Q4hrs, Novolog 3 units Q4hrs Meal Coverage  Inpatient Diabetes Program Recommendations: Insulin - Basal: Glucose in the mid 200 range. Patient takes basal insulin at home. Please consider starting Lantus 15 units Q24hrs for tighter glucose control. Insulin - Tube Feed Coverage: Consider increasing Tube Feed Coverage to Novolog 5 units Q4.  Thanks,  Christena Deem RN, MSN, [facility] Inpatient Diabetes Coordinator Team Pager [phone] (8a-5p)

## 2015-10-03 NOTE — Progress Notes (Signed)
CRITICAL VALUE ALERT  Critical value received:  Respiratory Culture + MRSA  Date of notification:  10/03/15  Time of notification:  08:17  Critical value read back: Yes   Nurse who received alert:  C. Lewie Chamber, RN  MD notified (1st page):  P. Sethi  Time of first page:  08:20  MD notified (2nd page): n/a   Time of second page: n/a  Responding MD:  Lina Sayre  Time MD responded:  08:20 (MD on unit)

## 2015-10-03 NOTE — Progress Notes (Signed)
STROKE TEAM PROGRESS NOTE   SUBJECTIVE (INTERVAL HISTORY) No family  at the bedside. Currently on versed drip. Patient had no documented events overnight.  Remains intubated.  She does not follow commands. I spoke to the patient's sister yesterday and explained poor prognosis and family wants full support for now and will decide on trach/PEG soon    OBJECTIVE Temp:  [98.2 F (36.8 C)-101.2 F (38.4 C)] 98.6 F (37 C) (02/07 1200) Pulse Rate:  [65-116] 83 (02/07 1345) Cardiac Rhythm:  [-] Normal sinus rhythm (02/07 1000) Resp:  [21-46] 41 (02/07 1345) BP: (90-153)/(53-80) 99/59 mmHg (02/07 1300) SpO2:  [94 %-100 %] 99 % (02/07 1345) FiO2 (%):  [30 %] 30 % (02/07 1200) Weight:  [240 lb 11.9 oz (109.2 kg)] 240 lb 11.9 oz (109.2 kg) (02/07 0500)  CBC:   Recent Labs Lab 09/28/15 0112  09/29/15 0420  10/02/15 0552 10/03/15 1030  WBC 18.7*  < > 11.3*  < > 11.3* 10.1  NEUTROABS 2.6  --  6.1  --   --   --   HGB 12.0  < > 11.3*  < > 9.8* 9.4*  HCT 35.8  < > 34.5*  < > 29.7* 28.4*  MCV 92.5  < > 93.0  < > 94.0 94.0  PLT 276  < > 288  < > 275 264  < > = values in this interval not displayed.  Basic Metabolic Panel:   Recent Labs Lab 10/02/15 0552 10/03/15 1030  NA 142 145  K 4.4 4.2  CL 111 113*  CO2 22 21*  GLUCOSE 290* 282*  BUN 42* 60*  CREATININE 1.36* 1.48*  CALCIUM 8.5* 8.5*  MG 2.0 2.1  PHOS 4.0 3.9    Lipid Panel:     Component Value Date/Time   CHOL 170 09/28/2015 0938   TRIG 93 10/01/2015 0850   HDL 38* 09/28/2015 0938   CHOLHDL 4.5 09/28/2015 0938   VLDL UNABLE TO CALCULATE IF TRIGLYCERIDE OVER 400 mg/dL 40/98/1191 4782   LDLCALC UNABLE TO CALCULATE IF TRIGLYCERIDE OVER 400 mg/dL 95/62/1308 6578   IONG2X:  Lab Results  Component Value Date   HGBA1C 6.9* 09/28/2015   Urine Drug Screen:     Component Value Date/Time   LABOPIA NONE DETECTED 09/28/2015 0226   LABBENZ NONE DETECTED 09/28/2015 0226   AMPHETMU NONE DETECTED 09/28/2015 0226   THCU NONE  DETECTED 09/28/2015 0226   LABBARB NONE DETECTED 09/28/2015 0226      IMAGING I have personally reviewed the radiological images below and agree with the radiology interpretations.  Ct Head Wo Contrast 09/28/2015  1. New calcific density in the left sylvian fissure, possible calcific distal M2 embolus. No visible acute infarct or hemorrhage. 2. Advanced chronic small vessel disease with multiple lacunar infarcts.   CTA NECK 09/28/2015   Moderately motion degraded examination, limited by LEFT venous contamination. No definite acute vascular process or hemodynamically significant stenosis. 3.5 x 5.7 cm dominant LEFT thyroid not oral for which follow up thyroid sonogram is recommended on a nonemergent basis.   CTA HEAD 09/28/2015   LEFT M2 segment occlusion corresponding to sylvian fissure density on today's CT, likely acute. Complete circle of Willis.  Mild intracranial atherosclerosis. Moderate calcific atherosclerosis the carotid siphons.   Cerebral Angiogram 09/28/2015 S/P Lt internal carotid arteriogram followed by complete revascularization of occluded dominant inferior division of LT MCA with x pass with trevoprovue 4 mmx 30 mm retrieval device and 6 mg of superselective Integrelin achieving a TICI  3 flow.  MRI  Large acute hemorrhagic left hemispheric infarct involves the majority of the left middle cerebral artery distribution. Hemorrhage is most notable involving the left posterior temporal -parietal aspect of this acute infarct. Local mass effect upon the left lateral ventricle. At most there is minimal bowing of the septum to the right. Several small acute nonhemorrhagic right hemispheric infarcts involving right caudate head, superior aspect of the post limb right internal capsule, right frontal lobe, superior right lenticular nucleus and right occipital lobe. Several small acute nonhemorrhagic cerebellar infarcts greater on the right. Remote left lenticular nucleus infarct or  hematoma with blood-stained cleft now noted. Moderate to marked chronic small vessel disease changes. Atrophy.  EEG Abnormalities: 1) possible attenuation of sleep structures in the left central region 2) lack of waking state Clinical Interpretation: This EEG is consistent with a sleep/sedation EEG with a possible suggestion of a left central cerebral dysfunction, though I don't think this is definite by this study.  2D echo - Systolic function was normal. The estimated ejection fraction was in the range of 55% to 60%. Wall motion was normal; there were no regional wall motion abnormalities. Doppler parameters are consistent with abnormal left ventricular relaxation (grade 1 diastolic dysfunction).  LE venous doppler - preliminary report no DVT    Physical exam  Temp:  [98.2 F (36.8 C)-101.2 F (38.4 C)] 98.6 F (37 C) (02/07 1200) Pulse Rate:  [65-116] 83 (02/07 1345) Resp:  [21-46] 41 (02/07 1345) BP: (90-153)/(53-80) 99/59 mmHg (02/07 1300) SpO2:  [94 %-100 %] 99 % (02/07 1345) FiO2 (%):  [30 %] 30 % (02/07 1200) Weight:  [240 lb 11.9 oz (109.2 kg)] 240 lb 11.9 oz (109.2 kg) (02/07 0500)  General - obese, well developed, intubated; versed gtt  HEENT:  NCAT; sclera slightly icteric;   Cardiovascular - Regular rate and rhythm. Pulm:  Scattered rhonchi Abd:  Obese; ND, normal bowel sounds Ext:  No C/C/E  Neurologic Exam MENTAL STATUS: Intubated and on versed Not responsive to voice, does not open eyes.  Pupils 3mm, poorly reactive to light, slight doll's eye with head movement, weak corneal and strong gag reflexes, breathing over the vent.   MOTOR/SENSORY: On painful stimulation, she has weak withdrawal of right UE and no response in the right LE; she spontaneously moves the left UE and LE.Tone diminished on right and normal on left  Coordination or gait can not able to test.   ASSESSMENT/PLAN Linda [name] is a 60 y.o. female with history of left BG  ICH in 2012, hypertension, cocaine abuse, renal failure and hepatitis C presenting with altered mental status, right side weakness and leftward gaze. Also concerning for possible seizure activity initial presentation. She did not receive IV t-PA due to hx ICH, CTA showed a L M2 occlusion, she was sent to IR where she received complete TICI3 revascularization of the L MCA with mechanical thrombectomy with trevo and IA Integrilin.  Stroke:  left MCA infarct with hemorrhagic transformation s/p revascularizationf of L M2, multifocal scattered infarcts involving bilateral posterior and anterior circulation, consistent with cardioembolic source although pt does have significant large vessel intracranial atherosclerosis.  Resultant  Intubated and unresponsive  MRI - multifocal scattered infarcts involving bilateral posterior and anterior circulation, largest at left MCA territory with patchy hemorrhagic transformation on MRI but not on CT  Repeat CT - left MCA hemorrhagic infarct   CTA head L M2 occlusion, b/l ICA supraclinoid segment significant atherosclerosis, L>R with moderate to severe  stenosis.  CTA neck L thyroid nodule  2D Echo  Results above  LE venous doppler preliminary negative  LDL unable to calculate due to high TG   HgbA1c 6.9  UDS positive for cocaine  Heparin subq for VTE prophylaxis Diet NPO time specified  No antithrombotic prior to admission, now on ASA .   Ongoing aggressive stroke risk factor management  Therapy recommendations:  pending   Disposition:  pending   ? Seizure  Questionable seizure presentation initially as reported in chart  EEG showed no seizure but under sedation  Hold off AEDs at this time  on versed drip  Hx of ICH  2012 left BG ICH; during hospital course:  BP high at 200s  Urine positive with cocaine   S/p trach and PEG at that time  Lost follow up since  Acute respiratory failure  Intubated in ED due to airway  protection  On versed with ventilation  CCM following  Hypertensive Emergency  BP as high as 202/124  off cardene drip  Stable now with tight parameters post IR < 160  Hyperlipidemia  Home meds:  No statin  LDL not able to calculate due to high TG , goal < 70  Total chol 170  Consider statin once po access.  Cocaine abuse  urine positive both in 2012 and this admission  Likely one of the causes of intracranial vascular stenosis  Need cessation counseling   Other Stroke Risk Factors  Obesity  Other Active Problems  Hepatitis C  Hx Dysphagia secondary to ICH in 2012 with PEG  Leukocytosis and fever:  Fever Tm 101.3; WBCs 18.7->11.3-> 13  Hospital day # 5  This patient is critically ill due to large left MCA infarct with left M2 occlusion s/p intervention, hemorrhagic transformation and at significant risk of neurological worsening, death form recurrent infarcts, increased ICH, brain herniation, cerebral edema, heart failure. This patient's care requires constant monitoring of vital signs, hemodynamics, respiratory and cardiac monitoring, review of multiple databases, neurological assessment, discussion with family, other specialists and medical decision making of high complexity. I spent 30 minutes of neurocritical care time in the care of this patient.    Prognosis is quite poor and I did discuss with sister goals of care  On 2/6/17as she likely will need trach, peg, prolonged nursing home placement. Palliative care/comfort care may be appropriate given her poor baseline.Family to decide soon  D/w Dr Molli Knock   Delia Heady, MD Stroke Neurology 10/03/2015 2:33 PM      To contact Stroke Continuity provider, please refer to [email or web address]. After hours, contact General Neurology

## 2015-10-03 NOTE — Consult Note (Signed)
Reason for Consult:PEG Referring Physician: Inita Uram is an 59 y.o. female.  HPI: Linda [name] suffered a stroke and is unable eat. Surgery was asked to see pt for consideration of PEG placement. The patient is on the ventilator and is unable to contribute to history. Her sister told me that she had a PEG in 2012 after a stroke. The family would like to pursue placement of another PEG.  Past Medical History  Diagnosis Date  . Hep C w/o coma, chronic ([place])   . Cocaine abuse   . Acute renal failure ([place])   . Hypertension   . Stroke [facility]) 2012    hemorrhagic    Past Surgical History  Procedure Laterality Date  . Radiology with anesthesia N/A 09/28/2015    Procedure: RADIOLOGY WITH ANESTHESIA;  Surgeon: Medication Radiologist, MD;  Location: [place];  Service: Radiology;  Laterality: N/A;    No family history on file.  Social History:  reports that she uses illicit drugs (Cocaine). She reports that she does not drink alcohol. Her tobacco history is not on file.  Allergies: No Known Allergies  Medications: I have reviewed the patient's current medications.  Results for orders placed or performed during the hospital encounter of 09/28/15 (from the past 48 hour(s))  Glucose, capillary     Status: Abnormal   Collection Time: 10/01/15  3:54 PM  Result Value Ref Range   Glucose-Capillary 208 (H) 65 - 99 mg/dL  Glucose, capillary     Status: Abnormal   Collection Time: 10/01/15  8:17 PM  Result Value Ref Range   Glucose-Capillary 238 (H) 65 - 99 mg/dL  Glucose, capillary     Status: Abnormal   Collection Time: 10/01/15 11:24 PM  Result Value Ref Range   Glucose-Capillary 259 (H) 65 - 99 mg/dL  Glucose, capillary     Status: Abnormal   Collection Time: 10/02/15  4:08 AM  Result Value Ref Range   Glucose-Capillary 252 (H) 65 - 99 mg/dL  Blood gas, arterial     Status: Abnormal   Collection Time: 10/02/15  4:30 AM  Result Value Ref Range   FIO2 0.30    Delivery systems  VENTILATOR    Mode PRESSURE REGULATED VOLUME CONTROL    VT 490 mL   LHR 20 resp/min   Peep/cpap 5.0 cm H20   pH, Arterial 7.411 7.350 - 7.450   pCO2 arterial 34.2 (L) 35.0 - 45.0 mmHg   pO2, Arterial 78.0 (L) 80.0 - 100.0 mmHg   Bicarbonate 21.2 20.0 - 24.0 mEq/L   TCO2 22.2 0 - 100 mmol/L   Acid-base deficit 2.7 (H) 0.0 - 2.0 mmol/L   O2 Saturation 95.0 %   Patient temperature 99.0    Collection site A-LINE    Drawn by [id number]    Sample type ARTERIAL DRAW   CBC     Status: Abnormal   Collection Time: 10/02/15  5:52 AM  Result Value Ref Range   WBC 11.3 (H) 4.0 - 10.5 K/uL   RBC 3.16 (L) 3.87 - 5.11 MIL/uL   Hemoglobin 9.8 (L) 12.0 - 15.0 g/dL   HCT 29.7 (L) 36.0 - 46.0 %   MCV 94.0 78.0 - 100.0 fL   MCH 31.0 26.0 - 34.0 pg   MCHC 33.0 30.0 - 36.0 g/dL   RDW 14.2 11.5 - 15.5 %   Platelets 275 150 - 400 K/uL  Basic metabolic panel     Status: Abnormal   Collection Time: 10/02/15  5:52 AM  Result Value Ref Range   Sodium 142 135 - 145 mmol/L   Potassium 4.4 3.5 - 5.1 mmol/L   Chloride 111 101 - 111 mmol/L   CO2 22 22 - 32 mmol/L   Glucose, Bld 290 (H) 65 - 99 mg/dL   BUN 42 (H) 6 - 20 mg/dL   Creatinine, Ser 1.36 (H) 0.44 - 1.00 mg/dL   Calcium 8.5 (L) 8.9 - 10.3 mg/dL   GFR calc non Af Amer 41 (L) >60 mL/min   GFR calc Af Amer 48 (L) >60 mL/min    Comment: (NOTE) The eGFR has been calculated using the CKD EPI equation. This calculation has not been validated in all clinical situations. eGFR's persistently <60 mL/min signify possible Chronic Kidney Disease.    Anion gap 9 5 - 15  Magnesium     Status: None   Collection Time: 10/02/15  5:52 AM  Result Value Ref Range   Magnesium 2.0 1.7 - 2.4 mg/dL  Phosphorus     Status: None   Collection Time: 10/02/15  5:52 AM  Result Value Ref Range   Phosphorus 4.0 2.5 - 4.6 mg/dL  Glucose, capillary     Status: Abnormal   Collection Time: 10/02/15  8:11 AM  Result Value Ref Range   Glucose-Capillary 249 (H) 65 - 99 mg/dL    Comment 1 Notify RN    Comment 2 Document in Chart   Glucose, capillary     Status: Abnormal   Collection Time: 10/02/15 11:38 AM  Result Value Ref Range   Glucose-Capillary 277 (H) 65 - 99 mg/dL   Comment 1 Notify RN    Comment 2 Document in Chart   Glucose, capillary     Status: Abnormal   Collection Time: 10/02/15  3:40 PM  Result Value Ref Range   Glucose-Capillary 262 (H) 65 - 99 mg/dL   Comment 1 Notify RN    Comment 2 Document in Chart   Glucose, capillary     Status: Abnormal   Collection Time: 10/02/15  7:49 PM  Result Value Ref Range   Glucose-Capillary 242 (H) 65 - 99 mg/dL  Glucose, capillary     Status: Abnormal   Collection Time: 10/02/15 11:35 PM  Result Value Ref Range   Glucose-Capillary 210 (H) 65 - 99 mg/dL  Glucose, capillary     Status: Abnormal   Collection Time: 10/03/15  3:59 AM  Result Value Ref Range   Glucose-Capillary 250 (H) 65 - 99 mg/dL  Blood gas, arterial     Status: Abnormal   Collection Time: 10/03/15  4:50 AM  Result Value Ref Range   FIO2 0.30    Delivery systems VENTILATOR    Mode PRESSURE REGULATED VOLUME CONTROL    VT 490 mL   LHR 20 resp/min   Peep/cpap 5.0 cm H20   pH, Arterial 7.409 7.350 - 7.450   pCO2 arterial 32.4 (L) 35.0 - 45.0 mmHg   pO2, Arterial 60.3 (L) 80.0 - 100.0 mmHg   Bicarbonate 20.1 20.0 - 24.0 mEq/L   TCO2 21.1 0 - 100 mmol/L   Acid-base deficit 3.8 (H) 0.0 - 2.0 mmol/L   O2 Saturation 90.1 %   Patient temperature 98.6    Collection site BRACHIAL ARTERY    Drawn by [phone]    Sample type ARTERIAL    Allens test (pass/fail) PASS PASS  Glucose, capillary     Status: Abnormal   Collection Time: 10/03/15  8:11 AM  Result Value Ref  Range   Glucose-Capillary 211 (H) 65 - 99 mg/dL   Comment 1 Notify RN    Comment 2 Document in Chart   CBC     Status: Abnormal   Collection Time: 10/03/15 10:30 AM  Result Value Ref Range   WBC 10.1 4.0 - 10.5 K/uL   RBC 3.02 (L) 3.87 - 5.11 MIL/uL   Hemoglobin 9.4 (L) 12.0 -  15.0 g/dL   HCT 28.4 (L) 36.0 - 46.0 %   MCV 94.0 78.0 - 100.0 fL   MCH 31.1 26.0 - 34.0 pg   MCHC 33.1 30.0 - 36.0 g/dL   RDW 14.3 11.5 - 15.5 %   Platelets 264 150 - 400 K/uL  Basic metabolic panel     Status: Abnormal   Collection Time: 10/03/15 10:30 AM  Result Value Ref Range   Sodium 145 135 - 145 mmol/L   Potassium 4.2 3.5 - 5.1 mmol/L   Chloride 113 (H) 101 - 111 mmol/L   CO2 21 (L) 22 - 32 mmol/L   Glucose, Bld 282 (H) 65 - 99 mg/dL   BUN 60 (H) 6 - 20 mg/dL   Creatinine, Ser 1.48 (H) 0.44 - 1.00 mg/dL   Calcium 8.5 (L) 8.9 - 10.3 mg/dL   GFR calc non Af Amer 37 (L) >60 mL/min   GFR calc Af Amer 43 (L) >60 mL/min    Comment: (NOTE) The eGFR has been calculated using the CKD EPI equation. This calculation has not been validated in all clinical situations. eGFR's persistently <60 mL/min signify possible Chronic Kidney Disease.    Anion gap 11 5 - 15  Magnesium     Status: None   Collection Time: 10/03/15 10:30 AM  Result Value Ref Range   Magnesium 2.1 1.7 - 2.4 mg/dL  Phosphorus     Status: None   Collection Time: 10/03/15 10:30 AM  Result Value Ref Range   Phosphorus 3.9 2.5 - 4.6 mg/dL  Glucose, capillary     Status: Abnormal   Collection Time: 10/03/15 11:53 AM  Result Value Ref Range   Glucose-Capillary 194 (H) 65 - 99 mg/dL   Comment 1 Notify RN    Comment 2 Document in Chart     Dg Chest Port 1 View  10/03/2015  CLINICAL DATA:  Intubated patient, acute respiratory failure, acute CVA, fever and elevated white blood cell count. EXAM: PORTABLE CHEST 1 VIEW COMPARISON:  Chest x-ray of October 02, 2015. FINDINGS: There has been interval development of patchy interstitial and early alveolar opacities in the left mid and lower lung. The interstitial markings elsewhere in both lungs are mildly increased but stable. The heart is normal in size. There is tortuosity of the descending thoracic aorta. There is no pleural effusion or pneumothorax. The endotracheal tube tip  lies 4 cm above the carina. The esophagogastric tube tip projects below the inferior margin of the image. IMPRESSION: Interval development of subsegmental atelectasis or early pneumonia in the left mid and lower lung. Midpole interstitial prominence elsewhere in both lungs is stable. There is no evidence of pulmonary edema. Electronically Signed   By: David  Martinique M.D.   On: 10/03/2015 07:35   Dg Chest Port 1 View  10/02/2015  CLINICAL DATA:  Intubated patient, status post CVA, acute respiratory failure, fever and elevated white blood cell count. EXAM: PORTABLE CHEST 1 VIEW COMPARISON:  Portable chest x-ray of September 30, 2005. FINDINGS: The lungs are adequately inflated. There is no focal infiltrate. The interstitial markings are  mildly prominent and more conspicuous today. The heart is normal in size. The pulmonary vascularity is not clearly engorged. The endotracheal tube tip lies 2.7 cm above the carina. The esophagogastric tube tip projects below the inferior margin of the image. The observed bony thorax is unremarkable. IMPRESSION: Slight increase conspicuity of the pulmonary interstitium which may reflect mild interstitial edema or less likely interstitial pneumonia. The support tubes are in reasonable position. Electronically Signed   By: David  Martinique M.D.   On: 10/02/2015 07:36    Review of Systems  Unable to perform ROS: intubated   Blood pressure 100/60, pulse 74, temperature 98.6 F (37 C), temperature source Axillary, resp. rate 29, height 5' 7"  (1.702 m), weight 109.2 kg (240 lb 11.9 oz), SpO2 98 %. Physical Exam  Constitutional: She appears well-developed and well-nourished. No distress.  HENT:  Head: Normocephalic and atraumatic.  Neck: Normal range of motion. Neck supple.  Cardiovascular: Normal rate.   Respiratory: Breath sounds normal.  GI: Soft. Bowel sounds are normal. She exhibits no distension. There is no tenderness. There is no rebound and no guarding.  Neurological: She  is unresponsive.  Skin: She is not diaphoretic.    Assessment/Plan: Dysphagia 2/2 CVA -- PEG planned for tomorrow at 10:00AM with Dr. Hulen Skains. Hold TF at [place].    Lisette Abu, PA-C Pager: [phone] General Trauma PA Pager: [phone] 10/03/2015, 1:13 PM

## 2015-10-03 NOTE — Progress Notes (Signed)
OT Cancellation Note  Patient Details Name: Linda [name] MRN: [id number] DOB: Jan 05, 1956   Cancelled Treatment:    Reason Eval/Treat Not Completed: Patient not medically ready - Will sign off at this time, and reorder when pt able to participate.   Angelene Giovanni [place], OTR/L [phone]  10/03/2015, 9:35 AM

## 2015-10-03 NOTE — Progress Notes (Signed)
eLink Physician-Brief Progress Note Patient Name: Linda [name] DOB: 1956/03/11 MRN: [id number]   Date of Service  10/03/2015  HPI/Events of Note  Agitation.  eICU Interventions  Increase ceiling on Precedex IV infusion to 1.7 mcg/kg/hour.      Intervention Category Minor Interventions: Agitation / anxiety - evaluation and management  Sommer,Steven Eugene 10/03/2015, 4:07 AM

## 2015-10-03 NOTE — Progress Notes (Addendum)
Pharmacy Antibiotic Note Linda [name] is a 60 y.o. female admitted on 09/28/2015 with coag (-) staph bacteremia and MRSA growing from tracheal aspirate in setting of chronic trach and PEG since 2012. Pharmacy has been consulted for Vancomycin dosing.  Plan: 1. Vancomycin 2000 mg IV x 1 now followed by vancomycin 1250 mg IV every 24 hours starting 2/8 2. Obtain trough at [facility]; goal 15-20 3. Following along with you daily   Height:  (170.2 cm) Weight: 240 lb 11.9 oz (109.2 kg) IBW/kg (Calculated) : 61.6  Temp (24hrs), Avg:99.3 F (37.4 C), Min:98.2 F (36.8 C), Max:101.2 F (38.4 C)   Recent Labs Lab 09/28/15 0938 09/29/15 0420 09/30/15 0350 10/01/15 0515 10/02/15 0552 10/03/15 1030  WBC 8.6 11.3* 13.0* 11.2* 11.3* 10.1  CREATININE  --  1.43* 1.28* 1.14* 1.36* 1.48*  LATICACIDVEN 1.5  --   --   --   --   --   Estimated CrCl: 30 - 50 ml/min   No Known Allergies  Antimicrobials this admission: 2/7 Vancomycin  >>  Dose adjustments this admission: n/a  Microbiology results: 2/2 BCx: ngF 2/2 UCx: ngF 2/4 BCx: coag(-) staph  2/4 Sputum: MRSA    Thank you for allowing pharmacy to be a part of this patient's care.  Pollyann Samples, PharmD, BCPS 10/03/2015, 3:43 PM Pager: [phone]

## 2015-10-03 NOTE — Progress Notes (Signed)
PULMONARY / CRITICAL CARE MEDICINE   Name: Linda [name] MRN: [id number] DOB: [birth year]    ADMISSION DATE:  09/28/2015 CONSULTATION DATE:  2/2  REFERRING MD:  Neuro  CHIEF COMPLAINT:  Obtunded   HISTORY OF PRESENT ILLNESS:   60 yo AAF with hx of stroke 2012 that required trach/peg and with residual right side weakness was seen at [facility] after falling off a couch. Code stroke called per EDP and transferred to [facility] for IR procedure for revascularization of of left MCA. She was then admitted to NSICU and PCCM asked to help manage vent and critical care issues. She remains intubated and sedated on arrival to ICU. Goals of extubation will be clarified with neuro.   SUBJECTIVE:  Not arousable, not waking up, restless in bed needing precedex, no further events overnight.   VITAL SIGNS: BP 106/62 mmHg  Pulse 79  Temp(Src) 100.1 F (37.8 C) (Axillary)  Resp 34  Ht  (1.702 m)  Wt 109.2 kg (240 lb 11.9 oz)  BMI 37.70 kg/m2  SpO2 98%  HEMODYNAMICS:    VENTILATOR SETTINGS: Vent Mode:  [-] PRVC FiO2 (%):  [30 %] 30 % Set Rate:  [20 bmp] 20 bmp Vt Set:  [490 mL] 490 mL PEEP:  [5 cmH20] 5 cmH20 Plateau Pressure:  [16 cmH20-23 cmH20] 23 cmH20  INTAKE / OUTPUT: I/O last 3 completed shifts: In: 2463.4 [I.V.:723.4; NG/GT:1740] Out: 850 [Urine:850]  PHYSICAL EXAMINATION: General:  Intubated and sedated with precedex Neuro: Withdraws to noxious stimulus but not awake enough to follow commands. HEENT:  ETT-> vent, short neck, pupils pinpoint, unreactive Cardiovascular: ST at 106; S1/S2, no MRG Lungs: Bilateral airflow Abdomen:  Obese, normal BS, S/NT/ND Musculoskeletal: No joint deformities Skin: warm and dry  LABS:  BMET  Recent Labs Lab 10/01/15 0515 10/02/15 0552 10/03/15 1030  NA 141 142 145  K 4.0 4.4 4.2  CL 108 111 113*  CO2 23 22 21*  BUN 29* 42* 60*  CREATININE 1.14* 1.36* 1.48*  GLUCOSE 248* 290* 282*   Electrolytes  Recent Labs Lab 10/01/15 0515  10/02/15 0552 10/03/15 1030  CALCIUM 8.6* 8.5* 8.5*  MG 1.8 2.0 2.1  PHOS 3.3 4.0 3.9   CBC  Recent Labs Lab 10/01/15 0515 10/02/15 0552 10/03/15 1030  WBC 11.2* 11.3* 10.1  HGB 9.8* 9.8* 9.4*  HCT 29.8* 29.7* 28.4*  PLT 250 275 264   Coag's  Recent Labs Lab 09/28/15 0112  APTT 27  INR 0.98   Sepsis Markers  Recent Labs Lab 09/28/15 0938 09/29/15 0420 09/30/15 0350  LATICACIDVEN 1.5  --   --   PROCALCITON <0.10 0.10 0.10   ABG  Recent Labs Lab 10/01/15 0358 10/02/15 0430 10/03/15 0450  PHART 7.453* 7.411 7.409  PCO2ART 34.6* 34.2* 32.4*  PO2ART 105.0* 78.0* 60.3*   Liver Enzymes  Recent Labs Lab 09/28/15 0112  AST 26  ALT 16  ALKPHOS 94  BILITOT 0.4  ALBUMIN 4.1   Cardiac Enzymes  Recent Labs Lab 09/28/15 0112  TROPONINI <0.03   Glucose  Recent Labs Lab 10/02/15 1138 10/02/15 1540 10/02/15 1949 10/02/15 2335 10/03/15 0359 10/03/15 0811  GLUCAP 277* 262* 242* 210* 250* 211*   Imaging Dg Chest Port 1 View  10/03/2015  CLINICAL DATA:  Intubated patient, acute respiratory failure, acute CVA, fever and elevated white blood cell count. EXAM: PORTABLE CHEST 1 VIEW COMPARISON:  Chest x-ray of October 02, 2015. FINDINGS: There has been interval development of patchy interstitial and early alveolar opacities  in the left mid and lower lung. The interstitial markings elsewhere in both lungs are mildly increased but stable. The heart is normal in size. There is tortuosity of the descending thoracic aorta. There is no pleural effusion or pneumothorax. The endotracheal tube tip lies 4 cm above the carina. The esophagogastric tube tip projects below the inferior margin of the image. IMPRESSION: Interval development of subsegmental atelectasis or early pneumonia in the left mid and lower lung. Midpole interstitial prominence elsewhere in both lungs is stable. There is no evidence of pulmonary edema. Electronically Signed   By: David  [place] M.D.   On:  10/03/2015 07:35   STUDIES:  Echo 02/20>> EEG 02/20: EEG is consistent with a sleep/sedation; possible suggestion of a left central cerebral dysfunction  CULTURES: 2/2 bc x 2>> 2/2 uc>> 2/2 sputum>>GRAM POSITIVE COCCI  IN PAIRS, IN CHAINS, IN CLUSTERS  AND FEW GRAM NEGATIVE RODS   ANTIBIOTICS: None  SIGNIFICANT EVENTS: Code stroke 2/2  LINES/TUBES: 2/2 OTT>> 2/2 rt femoral sheath>> 2/3 left rad a line>>  DISCUSSION: 60 yo AAF hx of stroke and new neurological event + cocaine. Now s/p interventional procedure for L MCA occlusion.   ASSESSMENT / PLAN:  PULMONARY A: VDRF post IR procedure on 2/2 P:   Failed PS trials, continue full vent support. Titrate O2 for sat of 88-92%. Spoke with sister, now they do not wish for trach/peg, will need to talk to daughter.  CARDIOVASCULAR A:  Hx of HTN, exacerbated by cocaine use.  P:  Off cardene GTT. BP parameters per neurology. Hydralazine prn to maintain SBP<160. Echocardiogram 55-60%.  RENAL Lab Results  Component Value Date   CREATININE 1.48* 10/03/2015   CREATININE 1.36* 10/02/2015   CREATININE 1.14* 10/01/2015   CREATININE 1.27 02/28/2014   CREATININE 1.15 04/10/2012   CREATININE 1.32* 11/13/2011   A:   ARI, likely related to HTN, volume status and dye load for procedure; slight improvement.  P:   KVO IVF. Follow BMP. Replace electrolytes as indicated.  GASTROINTESTINAL A:   Hx of  Dysphagia with Peg in 2012 P:   TF. PPI.  HEMATOLOGIC A:   No acute issues P:  PAS  INFECTIOUS A:   No immediate concern for infection P:   Monitor infection parameters   ENDOCRINE A:   Hyperglycemia without h/o diabetes P:   SSI and Blood glucose monitoring per ICU protocol Continue novolog 3 units q4 hours.  NEUROLOGIC A:   Post Left MCA recannulization of inferior division 2/2 Hx of hemorraghic stroke 2012 with residual rt side weakness. Required trach/peg in 2012 Substance abuse/ + for cocaine    P:    D/C versed drip. D/C fentanyl drip. PRN fentanyl. Precedex drip.  FAMILY  - Updates: Sister does not wish for trach/peg, awaiting decision from niece.  - Inter-disciplinary family meet or Palliative Care meeting due by: 10/05/15  The patient is critically ill with multiple organ systems failure and requires high complexity decision making for assessment and support, frequent evaluation and titration of therapies, application of advanced monitoring technologies and extensive interpretation of multiple databases.   Critical Care Time devoted to patient care services described in this note is  35  Minutes. This time reflects time of care of this signee Dr Koren Bound. This critical care time does not reflect procedure time, or teaching time or supervisory time of PA/NP/Med student/Med Resident etc but could involve care discussion time.  Alyson Reedy, M.D. [facility] Pulmonary/Critical Care Medicine. Pager: [phone]. After hours  pager: [phone].  10/03/2015, 11:29 AM

## 2015-10-03 NOTE — Progress Notes (Signed)
eLink Physician-Brief Progress Note Patient Name: Linda [name] DOB: 07-26-1956 MRN: [id number]   Date of Service  10/03/2015  HPI/Events of Note  Trach Aspirate  growing MRSA  eICU Interventions  Will start Vancomycin        Erin Fulling 10/03/2015, 3:33 PM

## 2015-10-04 ENCOUNTER — Inpatient Hospital Stay (HOSPITAL_COMMUNITY): Payer: Medicaid Other

## 2015-10-04 ENCOUNTER — Encounter (HOSPITAL_COMMUNITY)
Admission: EM | Disposition: A | Discharge status: Skilled Nursing Facility | Payer: Self-pay | Source: Home / Self Care | Attending: Neurology

## 2015-10-04 DIAGNOSIS — J9690 Respiratory failure, unspecified, unspecified whether with hypoxia or hypercapnia: Secondary | ICD-10-CM | POA: Insufficient documentation

## 2015-10-04 DIAGNOSIS — I639 Cerebral infarction, unspecified: Secondary | ICD-10-CM | POA: Insufficient documentation

## 2015-10-04 DIAGNOSIS — I63031 Cerebral infarction due to thrombosis of right carotid artery: Secondary | ICD-10-CM

## 2015-10-04 DIAGNOSIS — I63039 Cerebral infarction due to thrombosis of unspecified carotid artery: Secondary | ICD-10-CM

## 2015-10-04 HISTORY — PX: PEG PLACEMENT: SHX5437

## 2015-10-04 HISTORY — PX: ESOPHAGOGASTRODUODENOSCOPY: SHX5428

## 2015-10-04 LAB — BASIC METABOLIC PANEL
ANION GAP: 10 (ref 5–15)
BUN: 69 mg/dL — ABNORMAL HIGH (ref 6–20)
CHLORIDE: 115 mmol/L — AB (ref 101–111)
CO2: 22 mmol/L (ref 22–32)
CREATININE: 1.68 mg/dL — AB (ref 0.44–1.00)
Calcium: 8.5 mg/dL — ABNORMAL LOW (ref 8.9–10.3)
GFR calc non Af Amer: 32 mL/min — ABNORMAL LOW (ref >60)
GFR, EST AFRICAN AMERICAN: 37 mL/min — AB (ref >60)
Glucose, Bld: 248 mg/dL — ABNORMAL HIGH (ref 65–99)
Potassium: 4.1 mmol/L (ref 3.5–5.1)
Sodium: 147 mmol/L — ABNORMAL HIGH (ref 135–145)

## 2015-10-04 LAB — APTT: APTT: 29 s (ref 24–37)

## 2015-10-04 LAB — TRIGLYCERIDES: Triglycerides: 245 mg/dL — ABNORMAL HIGH (ref <150)

## 2015-10-04 LAB — CBC
HEMATOCRIT: 29.1 % — AB (ref 36.0–46.0)
HEMOGLOBIN: 8.9 g/dL — AB (ref 12.0–15.0)
MCH: 29 pg (ref 26.0–34.0)
MCHC: 30.6 g/dL (ref 30.0–36.0)
MCV: 94.8 fL (ref 78.0–100.0)
Platelets: 278 10*3/uL (ref 150–400)
RBC: 3.07 MIL/uL — ABNORMAL LOW (ref 3.87–5.11)
RDW: 14.6 % (ref 11.5–15.5)
WBC: 12.2 10*3/uL — ABNORMAL HIGH (ref 4.0–10.5)

## 2015-10-04 LAB — BLOOD GAS, ARTERIAL
ACID-BASE DEFICIT: 3.4 mmol/L — AB (ref 0.0–2.0)
Bicarbonate: 20.4 mEq/L (ref 20.0–24.0)
Drawn by: 44166
FIO2: 0.3
MECHVT: 490 mL
O2 Saturation: 90.8 %
PCO2 ART: 32.7 mmHg — AB (ref 35.0–45.0)
PEEP: 5 cmH2O
PO2 ART: 63.2 mmHg — AB (ref 80.0–100.0)
Patient temperature: 98.9
RATE: 20 resp/min
TCO2: 21.4 mmol/L (ref 0–100)
pH, Arterial: 7.412 (ref 7.350–7.450)

## 2015-10-04 LAB — MAGNESIUM: Magnesium: 2.2 mg/dL (ref 1.7–2.4)

## 2015-10-04 LAB — GLUCOSE, CAPILLARY
GLUCOSE-CAPILLARY: 253 mg/dL — AB (ref 65–99)
GLUCOSE-CAPILLARY: 140 mg/dL — AB (ref 65–99)
Glucose-Capillary: 226 mg/dL — ABNORMAL HIGH (ref 65–99)
Glucose-Capillary: 229 mg/dL — ABNORMAL HIGH (ref 65–99)
Glucose-Capillary: 243 mg/dL — ABNORMAL HIGH (ref 65–99)

## 2015-10-04 LAB — PHOSPHORUS: PHOSPHORUS: 4.9 mg/dL — AB (ref 2.5–4.6)

## 2015-10-04 LAB — PROTIME-INR
INR: 1.16 (ref 0.00–1.49)
PROTHROMBIN TIME: 15 s (ref 11.6–15.2)

## 2015-10-04 SURGERY — INSERTION, PEG TUBE
Anesthesia: Moderate Sedation

## 2015-10-04 MED ORDER — FREE WATER
250.0000 mL | Freq: Four times a day (QID) | Status: DC
  Administered 2015-10-05 – 2015-10-07 (×10): 250 mL

## 2015-10-04 MED ORDER — FENTANYL CITRATE (PF) 100 MCG/2ML IJ SOLN
100.0000 ug | INTRAMUSCULAR | Status: AC
  Administered 2015-10-04: 100 ug via INTRAVENOUS

## 2015-10-04 MED ORDER — FENTANYL CITRATE (PF) 100 MCG/2ML IJ SOLN
INTRAMUSCULAR | Status: AC
  Filled 2015-10-04: qty 2

## 2015-10-04 MED ORDER — MIDAZOLAM HCL 2 MG/2ML IJ SOLN
INTRAMUSCULAR | Status: AC
  Filled 2015-10-04: qty 4

## 2015-10-04 MED ORDER — MIDAZOLAM HCL 2 MG/2ML IJ SOLN
4.0000 mg | Freq: Once | INTRAMUSCULAR | Status: AC
  Administered 2015-10-04: 4 mg via INTRAVENOUS

## 2015-10-04 MED ORDER — FENTANYL CITRATE (PF) 100 MCG/2ML IJ SOLN
100.0000 ug | Freq: Once | INTRAMUSCULAR | Status: AC
  Administered 2015-10-04: 100 ug via INTRAVENOUS

## 2015-10-04 MED ORDER — MIDAZOLAM HCL 2 MG/2ML IJ SOLN
2.0000 mg | Freq: Once | INTRAMUSCULAR | Status: AC
  Administered 2015-10-04: 2 mg via INTRAVENOUS

## 2015-10-04 NOTE — Progress Notes (Signed)
STROKE TEAM PROGRESS NOTE   SUBJECTIVE (INTERVAL HISTORY) No family  at the bedside. Currently on versed drip. Patient had no documented events overnight.  Remains intubated.  She does not follow commands.Dr Molli Knock spoke to the patient's sister yesterday and explained poor prognosis and family wants full support and n trach/PEG today    OBJECTIVE Temp:  [97.8 F (36.6 C)-102.6 F (39.2 C)] 100.6 F (38.1 C) (02/08 0800) Pulse Rate:  [62-109] 65 (02/08 1025) Cardiac Rhythm:  [-] Normal sinus rhythm (02/08 0800) Resp:  [0-49] 37 (02/08 1025) BP: (87-135)/(48-87) 109/87 mmHg (02/08 1025) SpO2:  [94 %-100 %] 97 % (02/08 1025) FiO2 (%):  [30 %] 30 % (02/08 1000) Weight:  [240 lb 8.4 oz (109.1 kg)] 240 lb 8.4 oz (109.1 kg) (02/08 0221)  CBC:   Recent Labs Lab 09/28/15 0112  09/29/15 0420  10/03/15 1030 10/04/15 0400  WBC 18.7*  < > 11.3*  < > 10.1 12.2*  NEUTROABS 2.6  --  6.1  --   --   --   HGB 12.0  < > 11.3*  < > 9.4* 8.9*  HCT 35.8  < > 34.5*  < > 28.4* 29.1*  MCV 92.5  < > 93.0  < > 94.0 94.8  PLT 276  < > 288  < > 264 278  < > = values in this interval not displayed.  Basic Metabolic Panel:   Recent Labs Lab 10/03/15 1030 10/04/15 0400  NA 145 147*  K 4.2 4.1  CL 113* 115*  CO2 21* 22  GLUCOSE 282* 248*  BUN 60* 69*  CREATININE 1.48* 1.68*  CALCIUM 8.5* 8.5*  MG 2.1 2.2  PHOS 3.9 4.9*    Lipid Panel:     Component Value Date/Time   CHOL 170 09/28/2015 0938   TRIG 245* 10/04/2015 0841   HDL 38* 09/28/2015 0938   CHOLHDL 4.5 09/28/2015 0938   VLDL UNABLE TO CALCULATE IF TRIGLYCERIDE OVER 400 mg/dL 16/05/9603 5409   LDLCALC UNABLE TO CALCULATE IF TRIGLYCERIDE OVER 400 mg/dL 81/19/1478 2956   OZHY8M:  Lab Results  Component Value Date   HGBA1C 6.9* 09/28/2015   Urine Drug Screen:     Component Value Date/Time   LABOPIA NONE DETECTED 09/28/2015 0226   LABBENZ NONE DETECTED 09/28/2015 0226   AMPHETMU NONE DETECTED 09/28/2015 0226   THCU NONE  DETECTED 09/28/2015 0226   LABBARB NONE DETECTED 09/28/2015 0226      IMAGING I have personally reviewed the radiological images below and agree with the radiology interpretations.  Ct Head Wo Contrast 09/28/2015  1. New calcific density in the left sylvian fissure, possible calcific distal M2 embolus. No visible acute infarct or hemorrhage. 2. Advanced chronic small vessel disease with multiple lacunar infarcts.   CTA NECK 09/28/2015   Moderately motion degraded examination, limited by LEFT venous contamination. No definite acute vascular process or hemodynamically significant stenosis. 3.5 x 5.7 cm dominant LEFT thyroid not oral for which follow up thyroid sonogram is recommended on a nonemergent basis.   CTA HEAD 09/28/2015   LEFT M2 segment occlusion corresponding to sylvian fissure density on today's CT, likely acute. Complete circle of Willis.  Mild intracranial atherosclerosis. Moderate calcific atherosclerosis the carotid siphons.   Cerebral Angiogram 09/28/2015 S/P Lt internal carotid arteriogram followed by complete revascularization of occluded dominant inferior division of LT MCA with x pass with trevoprovue 4 mmx 30 mm retrieval device and 6 mg of superselective Integrelin achieving a TICI 3 flow.  MRI  Large acute hemorrhagic left hemispheric infarct involves the majority of the left middle cerebral artery distribution. Hemorrhage is most notable involving the left posterior temporal -parietal aspect of this acute infarct. Local mass effect upon the left lateral ventricle. At most there is minimal bowing of the septum to the right. Several small acute nonhemorrhagic right hemispheric infarcts involving right caudate head, superior aspect of the post limb right internal capsule, right frontal lobe, superior right lenticular nucleus and right occipital lobe. Several small acute nonhemorrhagic cerebellar infarcts greater on the right. Remote left lenticular nucleus infarct or  hematoma with blood-stained cleft now noted. Moderate to marked chronic small vessel disease changes. Atrophy.  EEG Abnormalities: 1) possible attenuation of sleep structures in the left central region 2) lack of waking state Clinical Interpretation: This EEG is consistent with a sleep/sedation EEG with a possible suggestion of a left central cerebral dysfunction, though I don't think this is definite by this study.  2D echo - Systolic function was normal. The estimated ejection fraction was in the range of 55% to 60%. Wall motion was normal; there were no regional wall motion abnormalities. Doppler parameters are consistent with abnormal left ventricular relaxation (grade 1 diastolic dysfunction).  LE venous doppler -   no DVT    Physical exam  Temp:  [97.8 F (36.6 C)-102.6 F (39.2 C)] 100.6 F (38.1 C) (02/08 0800) Pulse Rate:  [62-109] 65 (02/08 1025) Resp:  [0-49] 37 (02/08 1025) BP: (87-135)/(48-87) 109/87 mmHg (02/08 1025) SpO2:  [94 %-100 %] 97 % (02/08 1025) FiO2 (%):  [30 %] 30 % (02/08 1000) Weight:  [240 lb 8.4 oz (109.1 kg)] 240 lb 8.4 oz (109.1 kg) (02/08 0221)  General - obese, well developed, intubated; versed gtt  HEENT:  NCAT; sclera slightly icteric;   Cardiovascular - Regular rate and rhythm. Pulm:  Scattered rhonchi Abd:  Obese; ND, normal bowel sounds Ext:  No C/C/E  Neurologic Exam MENTAL STATUS: Intubated and on versed Not responsive to voice, does not open eyes.  Pupils 3mm, poorly reactive to light, slight doll's eye with head movement, weak corneal and strong gag reflexes, breathing over the vent.   MOTOR/SENSORY: On painful stimulation, she has weak withdrawal of right UE and no response in the right LE; she spontaneously moves the left UE and LE.Tone diminished on right and normal on left  Coordination or gait can not able to test.   ASSESSMENT/PLAN Linda [name] is a 60 y.o. female with history of left BG ICH in 2012,  hypertension, cocaine abuse, renal failure and hepatitis C presenting with altered mental status, right side weakness and leftward gaze. Also concerning for possible seizure activity initial presentation. She did not receive IV t-PA due to hx ICH, CTA showed a L M2 occlusion, she was sent to IR where she received complete TICI3 revascularization of the L MCA with mechanical thrombectomy with trevo and IA Integrilin.  Stroke:  left MCA infarct with hemorrhagic transformation s/p revascularizationf of L M2, multifocal scattered infarcts involving bilateral posterior and anterior circulation, consistent with cardioembolic source although pt does have significant large vessel intracranial atherosclerosis.  Resultant  Intubated and unresponsive  MRI - multifocal scattered infarcts involving bilateral posterior and anterior circulation, largest at left MCA territory with patchy hemorrhagic transformation on MRI but not on CT  Repeat CT - left MCA hemorrhagic infarct   CTA head L M2 occlusion, b/l ICA supraclinoid segment significant atherosclerosis, L>R with moderate to severe stenosis.  CTA neck L  thyroid nodule  2D Echo  Results above  LE venous doppler preliminary negative  LDL unable to calculate due to high TG   HgbA1c 6.9  UDS positive for cocaine  Heparin subq for VTE prophylaxis Diet NPO time specified  No antithrombotic prior to admission, now on ASA 325mg .   Ongoing aggressive stroke risk factor management  Therapy recommendations:  pending   Disposition:  pending   ? Seizure  Questionable seizure presentation initially as reported in chart  EEG showed no seizure but under sedation  Hold off AEDs at this time  on versed drip  Hx of ICH  2012 left BG ICH; during hospital course:  BP high at 200s  Urine positive with cocaine   S/p trach and PEG at that time  Lost follow up since  Acute respiratory failure  Intubated in ED due to airway protection  On  versed with ventilation  CCM following  Hypertensive Emergency  BP as high as 202/124  off cardene drip  Stable now with tight parameters post IR < 160  Hyperlipidemia  Home meds:  No statin  LDL not able to calculate due to high TG , goal < 70  Total chol 170  Consider statin once po access.  Cocaine abuse  urine positive both in 2012 and this admission  Likely one of the causes of intracranial vascular stenosis  Need cessation counseling   Other Stroke Risk Factors  Obesity  Other Active Problems  Hepatitis C  Hx Dysphagia secondary to ICH in 2012 with PEG  Leukocytosis and fever:  Fever Tm 101.3; WBCs 18.7->11.3-> 13  Hospital day # 6  This patient is critically ill due to large left MCA infarct with left M2 occlusion s/p intervention, hemorrhagic transformation and at significant risk of neurological worsening, death form recurrent infarcts, increased ICH, brain herniation, cerebral edema, heart failure. This patient's care requires constant monitoring of vital signs, hemodynamics, respiratory and cardiac monitoring, review of multiple databases, neurological assessment, discussion with family, other specialists and medical decision making of high complexity. I spent 32 minutes of neurocritical care time in the care of this patient.    Prognosis is quite poor and I did discuss with sister goals of care  On 2/6/17as she likely will need trach, peg, prolonged nursing home placement.  .Family has decided  On full support  D/w Dr Molli Knock   Delia Heady, MD Stroke Neurology 10/04/2015 12:10 PM      To contact Stroke Continuity provider, please refer to [email or web address]. After hours, contact General Neurology

## 2015-10-04 NOTE — Op Note (Signed)
OPERATIVE REPORT  DATE OF OPERATION: 10/04/2015  PATIENT:  Linda [name]  60 y.o. female  PRE-OPERATIVE DIAGNOSIS:  dysphagia  POST-OPERATIVE DIAGNOSIS:  Same  PROCEDURE:  Procedure(s): PERCUTANEOUS ENDOSCOPIC GASTROSTOMY (PEG) PLACEMENT ESOPHAGOGASTRODUODENOSCOPY (EGD)  SURGEON:  Surgeon(s): Jimmye Norman, MD  ASSISTANTDan Humphreys, PA-S  ANESTHESIA:   IV sedation  EBL: <10 ml  BLOOD ADMINISTERED: none  DRAINS: Urinary Catheter (Foley) and Gastrostomy Tube   SPECIMEN:  No Specimen  COUNTS CORRECT:  YES  PROCEDURE DETAILS: The procedure was done at the patient's bedside in the #M ICU under local anesthesia and IV sedation.  Access was necessary for longterm nutritional support and administration of medications.  A proper timeout was performed and the patient was identified. The abdominal wall was prepped in usual manner. The surgeon uses the Pentax 2990 endoscope to cannulate the oropharynx into the upper esophagus alongside the patient's oral gastric tube. We were able to pass into the stomach after the oral gastric tube was removed. We were able to cannulate the pylorus into the first and second portion of duodenum were normal mucosa was identified.  We retrieved the endoscope back into the body of the stomach where the impulse from the assistant's finger was noted on the anterior abdominal wall and anterior wall of the stomach. This is very closed externally to the site where the patient and had a previous gastrostomy tube.  The patient was anesthetized at that site and subsequently an incision made. Through the incision an angiocatheter was passed under direct vision into the gastric lumen where, after the needle was removed, a looped blue wire was passed. This was grabbed by the snare through the endoscope and brought out through the patient's mouth.  We looped the blue wire around the pull-through gastrostomy tube which is in subsequent LEEP oh to its resting position on the  anterior gastric wall. Pitcher's of the gastrostomy tube and is proper position and noted.  The tube was secured in place in the usual manner. There was minimal bleeding. All were correct. The endoscope was used to aspirate most of the gas that had been insufflated during the procedure. The patient tolerated the procedure well.  PATIENT DISPOSITION:  The patient remained stable in her bed in the intensive care unit.        Jamori Biggar 2/8/201710:36 AM

## 2015-10-04 NOTE — Plan of Care (Signed)
Problem: Nutrition: Goal: Risk of aspiration will decrease Outcome: Not Progressing Suspecion of aspiration

## 2015-10-04 NOTE — Procedures (Signed)
Bedside Tracheostomy Insertion Procedure Note   Patient Details:   Name: Linda [name] DOB: 06/07/1956 MRN: [id number]  Procedure: Tracheostomy  Pre Procedure Assessment: ET Tube Size:7.0 ET Tube secured at lip (cm):21 Bite block in place: No Breath Sounds: Clear  Post Procedure Assessment: BP 134/76 mmHg  Pulse 84  Temp(Src) 97.7 F (36.5 C) (Axillary)  Resp 18  Ht  (1.702 m)  Wt 240 lb 8.4 oz (109.1 kg)  BMI 37.66 kg/m2  SpO2 100% O2 sats: stable throughout Complications: No apparent complications Patient did tolerate procedure well Tracheostomy Brand:Shiley Tracheostomy Style:Cuffed Tracheostomy Size: 6.0 Tracheostomy Secured WUJ:[id number] Tracheostomy Placement Confirmation:Trach cuff visualized and in place and Chest X ray ordered for placement    Loletta Specter 10/04/2015, 3:16 PM

## 2015-10-04 NOTE — Progress Notes (Signed)
Inpatient Diabetes Program Recommendations  AACE/ADA: New Consensus Statement on Inpatient Glycemic Control (2015)  Target Ranges:  Prepandial:   less than 140 mg/dL      Peak postprandial:   less than 180 mg/dL (1-2 hours)      Critically ill patients:  140 - 180 mg/dL   Results for ELIE, GRAGERT (MRN [id number]) as of 10/04/2015 09:29  Ref. Range 10/03/2015 08:11 10/03/2015 11:53 10/03/2015 15:31 10/03/2015 19:40 10/03/2015 23:36 10/04/2015 03:26 10/04/2015 07:46  Glucose-Capillary Latest Ref Range: 65-99 mg/dL 409 (H) 811 (H) 914 (H) 215 (H) 218 (H) 226 (H) 229 (H)   Review of Glycemic Control  Diabetes history: DM 2 Outpatient Diabetes medications: Lantus 60 units QHS Current orders for Inpatient glycemic control: Novolog Sensitive Q4hrs, Novolog 5 units Q4 hrs Tube Feed Coverage  Inpatient Diabetes Program Recommendations:  Insulin - Basal: Glucose in the mid 200 range. Patient takes basal insulin at home. Please consider starting Lantus 15 units Q24hrs for tighter glucose control.  Thanks,  Christena Deem RN, MSN, [facility] Inpatient Diabetes Coordinator Team Pager [phone] (8a-5p)

## 2015-10-04 NOTE — Progress Notes (Signed)
Patient did not have 1200 vent check due to pt receiving tracheostomy. RT will continue to monitor.

## 2015-10-04 NOTE — Progress Notes (Deleted)
Patient did not have a 1200 vent check due to pt being receiving tracheostomy during time for therapist to check patient. RT will continue to monitor.

## 2015-10-04 NOTE — Progress Notes (Signed)
PULMONARY / CRITICAL CARE MEDICINE   Name: Linda [name] MRN: [id number] DOB: 29-Sep-1955    ADMISSION DATE:  09/28/2015 CONSULTATION DATE:  2/2  REFERRING MD:  Neuro  CHIEF COMPLAINT:  Obtunded   HISTORY OF PRESENT ILLNESS:   60 yo AAF with hx of stroke 2012 that required trach/peg and with residual right side weakness was seen at [facility] after falling off a couch. Code stroke called per EDP and transferred to [facility] for IR procedure for revascularization of of left MCA. She was then admitted to NSICU and PCCM asked to help manage vent and critical care issues. She remains intubated and sedated on arrival to ICU. Goals of extubation will be clarified with neuro.   SUBJECTIVE:  Not arousable, not waking up, restless in bed needing precedex, no further events overnight.   VITAL SIGNS: BP 109/87 mmHg  Pulse 65  Temp(Src) 100.6 F (38.1 C) (Oral)  Resp 37  Ht  (1.702 m)  Wt 109.1 kg (240 lb 8.4 oz)  BMI 37.66 kg/m2  SpO2 97%  HEMODYNAMICS:    VENTILATOR SETTINGS: Vent Mode:  [-] PRVC FiO2 (%):  [30 %] 30 % Set Rate:  [20 bmp] 20 bmp Vt Set:  [490 mL] 490 mL PEEP:  [5 cmH20] 5 cmH20 Plateau Pressure:  [16 cmH20-22 cmH20] 17 cmH20  INTAKE / OUTPUT: I/O last 3 completed shifts: In: 2969 [I.V.:1173.7; NG/GT:1295.3; IV Piggyback:500] Out: -   PHYSICAL EXAMINATION: General:  Intubated and sedated with precedex Neuro: Withdraws to noxious stimulus but not awake enough to follow commands. HEENT:  ETT-> vent, short neck, pupils pinpoint, unreactive Cardiovascular: ST at 106; S1/S2, no MRG Lungs: Bilateral airflow Abdomen:  Obese, normal BS, S/NT/ND Musculoskeletal: No joint deformities Skin: warm and dry  LABS:  BMET  Recent Labs Lab 10/02/15 0552 10/03/15 1030 10/04/15 0400  NA 142 145 147*  K 4.4 4.2 4.1  CL 111 113* 115*  CO2 22 21* 22  BUN 42* 60* 69*  CREATININE 1.36* 1.48* 1.68*  GLUCOSE 290* 282* 248*   Electrolytes  Recent Labs Lab 10/02/15 0552  10/03/15 1030 10/04/15 0400  CALCIUM 8.5* 8.5* 8.5*  MG 2.0 2.1 2.2  PHOS 4.0 3.9 4.9*   CBC  Recent Labs Lab 10/02/15 0552 10/03/15 1030 10/04/15 0400  WBC 11.3* 10.1 12.2*  HGB 9.8* 9.4* 8.9*  HCT 29.7* 28.4* 29.1*  PLT 275 264 278   Coag's  Recent Labs Lab 09/28/15 0112 10/04/15 0400  APTT 27 29  INR 0.98 1.16   Sepsis Markers  Recent Labs Lab 09/28/15 0938 09/29/15 0420 09/30/15 0350  LATICACIDVEN 1.5  --   --   PROCALCITON <0.10 0.10 0.10   ABG  Recent Labs Lab 10/02/15 0430 10/03/15 0450 10/04/15 0416  PHART 7.411 7.409 7.412  PCO2ART 34.2* 32.4* 32.7*  PO2ART 78.0* 60.3* 63.2*   Liver Enzymes  Recent Labs Lab 09/28/15 0112  AST 26  ALT 16  ALKPHOS 94  BILITOT 0.4  ALBUMIN 4.1   Cardiac Enzymes  Recent Labs Lab 09/28/15 0112  TROPONINI <0.03   Glucose  Recent Labs Lab 10/03/15 1153 10/03/15 1531 10/03/15 1940 10/03/15 2336 10/04/15 0326 10/04/15 0746  GLUCAP 194* 272* 215* 218* 226* 229*   Imaging Dg Chest Port 1 View  10/04/2015  CLINICAL DATA:  Hypoxia EXAM: PORTABLE CHEST 1 VIEW COMPARISON:  October 03, 2015 FINDINGS: Endotracheal tube tip is 5.5 cm above the carina. Nasogastric tube tip and side port are below the diaphragm. No pneumothorax. There  is interstitial and patchy alveolar opacity in the left mid lower lung zone, slightly increased from 1 day prior. Right lung remains clear. Heart size and pulmonary vascularity are normal. No adenopathy. No bone lesions. IMPRESSION: Developing infiltrate, likely pneumonia, in the left mid lower lung zones. Lungs elsewhere clear. Tube positions as described without pneumothorax. No change in cardiac silhouette. Electronically Signed   By: Bretta Bang III M.D.   On: 10/04/2015 07:23   STUDIES:  Echo 02/20>> EEG 02/20: EEG is consistent with a sleep/sedation; possible suggestion of a left central cerebral dysfunction  CULTURES: 2/2 bc x 2>> 2/2 uc>> 2/2 sputum>>GRAM  POSITIVE COCCI  IN PAIRS, IN CHAINS, IN CLUSTERS  AND FEW GRAM NEGATIVE RODS   ANTIBIOTICS: None  SIGNIFICANT EVENTS: Code stroke 2/2  LINES/TUBES: 2/2 OTT>> 2/2 rt femoral sheath>> 2/3 left rad a line>>  DISCUSSION: 60 yo AAF hx of stroke and new neurological event + cocaine. Now s/p interventional procedure for L MCA occlusion.   ASSESSMENT / PLAN:  PULMONARY A: VDRF post IR procedure on 2/2 P:   Failed PS trials, continue full vent support. Titrate O2 for sat of 88-92%. Tracheostomy to be placed today.  CARDIOVASCULAR A:  Hx of HTN, exacerbated by cocaine use.  P:  Off cardene GTT. BP parameters per neurology. Hydralazine prn to maintain SBP<160. Echocardiogram 55-60%.  RENAL Lab Results  Component Value Date   CREATININE 1.68* 10/04/2015   CREATININE 1.48* 10/03/2015   CREATININE 1.36* 10/02/2015   CREATININE 1.27 02/28/2014   CREATININE 1.15 04/10/2012   CREATININE 1.32* 11/13/2011   A:   ARI, likely related to HTN, volume status and dye load for procedure; slight improvement.  P:   KVO IVF. Follow BMP. Replace electrolytes as indicated. Begin free water at 250 q6.  GASTROINTESTINAL A:   Hx of  Dysphagia with Peg in 2012 P:   TF on hold for PEG and trach today. PPI.  HEMATOLOGIC A:   No acute issues P:  PAS  INFECTIOUS A:   No immediate concern for infection P:   Monitor infection parameters   ENDOCRINE A:   Hyperglycemia without h/o diabetes P:   SSI and Blood glucose monitoring per ICU protocol Continue novolog 3 units q4 hours.  NEUROLOGIC A:   Post Left MCA recannulization of inferior division 2/2 Hx of hemorraghic stroke 2012 with residual rt side weakness. Required trach/peg in 2012 Substance abuse/ + for cocaine    P:   D/C versed drip. D/C fentanyl drip. PRN fentanyl. Precedex drip.  FAMILY  - Updates: Family decided on full care, trach/peg today.  - Inter-disciplinary family meet or Palliative Care meeting due  by: 10/05/15  The patient is critically ill with multiple organ systems failure and requires high complexity decision making for assessment and support, frequent evaluation and titration of therapies, application of advanced monitoring technologies and extensive interpretation of multiple databases.   Critical Care Time devoted to patient care services described in this note is  35  Minutes. This time reflects time of care of this signee Dr Koren Bound. This critical care time does not reflect procedure time, or teaching time or supervisory time of PA/NP/Med student/Med Resident etc but could involve care discussion time.  Alyson Reedy, M.D. [facility] Pulmonary/Critical Care Medicine. Pager: [phone]. After hours pager: [phone].  10/04/2015, 11:45 AM

## 2015-10-04 NOTE — Procedures (Signed)
Bronchoscopy  for Percutaneous  Tracheostomy  Name: Linda [name] MRN: [id number] DOB: August 14, 1956 Procedure: Bronchoscopy for Percutaneous Tracheostomy Indications: Diagnostic evaluation of the airways In conjunction with: Dr. Molli Knock Procedure Details Consent: Risks of procedure as well as the alternatives and risks of each were explained to the (patient/caregiver).  Consent for procedure obtained. Time Out: Verified patient identification, verified procedure, site/side was marked, verified correct patient position, special equipment/implants available, medications/allergies/relevent history reviewed, required imaging and test results available.  Performed  In preparation for procedure, patient was given 100% FiO2 and bronchoscope lubricated. Sedation: Benzodiazepines, Etomidate and Rocuronium  Airway entered and the following bronchi were examined: RUL, RML, RLL, LUL and LLL.   Procedures performed: Endotracheal Tube retracted in 2 cm increments. Cannulation of airway observed. Dilation observed. Placement of trachel tube  observed . No overt complications. Bronchoscope removed.    Evaluation Hemodynamic Status: BP stable throughout; O2 sats: stable throughout Patient's Current Condition: stable Specimens:  None Complications: No apparent complications Patient did tolerate procedure well.   Joneen Roach, AGACNP-BC [place] [facility]monology/Critical Care Pager [phone] or [phone]  10/04/2015 2:49 PM   Levy Pupa, MD, PhD 10/17/2015, 5:21 PM [place] [facility] [phone] or if no answer [phone]

## 2015-10-04 NOTE — Procedures (Signed)
Percutaneous Tracheostomy Placement  Consent from family.  Patient sedated, paralyzed and position.  Placed on 100% FiO2 and RR matched.  Area cleaned and draped.  Lidocaine/epi injected.  Skin incision done followed by blunt dissection.  Trachea palpated after significant dissection then punctured by Dr. Lindie Spruce, catheter passed and visualized bronchoscopically.  Wire placed and visualized.  Catheter removed.  Airway then crushed and dilated.  Size 6 cuffed shiley trach placed and visualized bronchoscopically well above carina.  Good volume returns.  Patient tolerated the procedure well without complications.  Minimal blood loss.  CXR ordered and pending.  Alyson Reedy, M.D. [facility] Pulmonary/Critical Care Medicine. Pager: [phone]. After hours pager: [phone].

## 2015-10-05 ENCOUNTER — Encounter (HOSPITAL_COMMUNITY): Payer: Self-pay | Admitting: General Surgery

## 2015-10-05 ENCOUNTER — Inpatient Hospital Stay (HOSPITAL_COMMUNITY): Payer: Medicaid Other

## 2015-10-05 DIAGNOSIS — J15212 Pneumonia due to Methicillin resistant Staphylococcus aureus: Secondary | ICD-10-CM

## 2015-10-05 DIAGNOSIS — G936 Cerebral edema: Secondary | ICD-10-CM

## 2015-10-05 LAB — BASIC METABOLIC PANEL
Anion gap: 12 (ref 5–15)
BUN: 72 mg/dL — AB (ref 6–20)
CHLORIDE: 115 mmol/L — AB (ref 101–111)
CO2: 22 mmol/L (ref 22–32)
CREATININE: 1.65 mg/dL — AB (ref 0.44–1.00)
Calcium: 8.4 mg/dL — ABNORMAL LOW (ref 8.9–10.3)
GFR calc Af Amer: 38 mL/min — ABNORMAL LOW (ref >60)
GFR calc non Af Amer: 33 mL/min — ABNORMAL LOW (ref >60)
GLUCOSE: 215 mg/dL — AB (ref 65–99)
POTASSIUM: 4.2 mmol/L (ref 3.5–5.1)
SODIUM: 149 mmol/L — AB (ref 135–145)

## 2015-10-05 LAB — BLOOD GAS, ARTERIAL
Acid-base deficit: 4.5 mmol/L — ABNORMAL HIGH (ref 0.0–2.0)
BICARBONATE: 19.1 meq/L — AB (ref 20.0–24.0)
Drawn by: 418751
FIO2: 0.4
LHR: 20 {breaths}/min
O2 SAT: 97.2 %
PATIENT TEMPERATURE: 98.6
PCO2 ART: 29.9 mmHg — AB (ref 35.0–45.0)
PEEP/CPAP: 5 cmH2O
PH ART: 7.422 (ref 7.350–7.450)
PO2 ART: 101 mmHg — AB (ref 80.0–100.0)
TCO2: 20 mmol/L (ref 0–100)
VT: 490 mL

## 2015-10-05 LAB — PHOSPHORUS: Phosphorus: 4.6 mg/dL (ref 2.5–4.6)

## 2015-10-05 LAB — CBC
HCT: 32.6 % — ABNORMAL LOW (ref 36.0–46.0)
Hemoglobin: 10.2 g/dL — ABNORMAL LOW (ref 12.0–15.0)
MCH: 29.6 pg (ref 26.0–34.0)
MCHC: 31.3 g/dL (ref 30.0–36.0)
MCV: 94.5 fL (ref 78.0–100.0)
PLATELETS: UNDETERMINED 10*3/uL (ref 150–400)
RBC: 3.45 MIL/uL — ABNORMAL LOW (ref 3.87–5.11)
RDW: 14.9 % (ref 11.5–15.5)
WBC: 18.9 10*3/uL — ABNORMAL HIGH (ref 4.0–10.5)

## 2015-10-05 LAB — MAGNESIUM: MAGNESIUM: 2.3 mg/dL (ref 1.7–2.4)

## 2015-10-05 LAB — GLUCOSE, CAPILLARY
GLUCOSE-CAPILLARY: 208 mg/dL — AB (ref 65–99)
GLUCOSE-CAPILLARY: 237 mg/dL — AB (ref 65–99)
Glucose-Capillary: 181 mg/dL — ABNORMAL HIGH (ref 65–99)
Glucose-Capillary: 191 mg/dL — ABNORMAL HIGH (ref 65–99)
Glucose-Capillary: 207 mg/dL — ABNORMAL HIGH (ref 65–99)
Glucose-Capillary: 212 mg/dL — ABNORMAL HIGH (ref 65–99)

## 2015-10-05 MED ORDER — ACETAMINOPHEN 160 MG/5ML PO SOLN
650.0000 mg | Freq: Four times a day (QID) | ORAL | Status: DC | PRN
  Administered 2015-10-06 – 2015-10-25 (×17): 650 mg via ORAL
  Filled 2015-10-05 (×17): qty 20.3

## 2015-10-05 MED ORDER — INSULIN GLARGINE 100 UNIT/ML ~~LOC~~ SOLN
15.0000 [IU] | Freq: Every day | SUBCUTANEOUS | Status: DC
  Administered 2015-10-05 – 2015-10-07 (×3): 15 [IU] via SUBCUTANEOUS
  Filled 2015-10-05 (×4): qty 0.15

## 2015-10-05 MED ORDER — INSULIN ASPART 100 UNIT/ML ~~LOC~~ SOLN
0.0000 [IU] | SUBCUTANEOUS | Status: DC
  Administered 2015-10-05 (×3): 7 [IU] via SUBCUTANEOUS
  Administered 2015-10-06: 4 [IU] via SUBCUTANEOUS
  Administered 2015-10-06: 11 [IU] via SUBCUTANEOUS
  Administered 2015-10-06: 7 [IU] via SUBCUTANEOUS
  Administered 2015-10-06: 4 [IU] via SUBCUTANEOUS
  Administered 2015-10-06: 7 [IU] via SUBCUTANEOUS
  Administered 2015-10-06: 11 [IU] via SUBCUTANEOUS
  Administered 2015-10-07 (×2): 7 [IU] via SUBCUTANEOUS
  Administered 2015-10-07: 3 [IU] via SUBCUTANEOUS
  Administered 2015-10-07 (×2): 4 [IU] via SUBCUTANEOUS
  Administered 2015-10-07: 3 [IU] via SUBCUTANEOUS
  Administered 2015-10-07 – 2015-10-08 (×4): 7 [IU] via SUBCUTANEOUS
  Administered 2015-10-08: 4 [IU] via SUBCUTANEOUS
  Administered 2015-10-08 – 2015-10-09 (×2): 7 [IU] via SUBCUTANEOUS
  Administered 2015-10-09 (×3): 11 [IU] via SUBCUTANEOUS
  Administered 2015-10-09: 7 [IU] via SUBCUTANEOUS
  Administered 2015-10-09: 11 [IU] via SUBCUTANEOUS
  Administered 2015-10-10: 7 [IU] via SUBCUTANEOUS
  Administered 2015-10-10: 11 [IU] via SUBCUTANEOUS
  Administered 2015-10-10: 7 [IU] via SUBCUTANEOUS
  Administered 2015-10-10 (×2): 11 [IU] via SUBCUTANEOUS
  Administered 2015-10-10 – 2015-10-11 (×2): 7 [IU] via SUBCUTANEOUS
  Administered 2015-10-11: 11 [IU] via SUBCUTANEOUS
  Administered 2015-10-11: 4 [IU] via SUBCUTANEOUS
  Administered 2015-10-11 (×2): 7 [IU] via SUBCUTANEOUS
  Administered 2015-10-11: 0 [IU] via SUBCUTANEOUS
  Administered 2015-10-12 (×2): 4 [IU] via SUBCUTANEOUS
  Administered 2015-10-12 (×2): 3 [IU] via SUBCUTANEOUS
  Administered 2015-10-12: 4 [IU] via SUBCUTANEOUS
  Administered 2015-10-12: 3 [IU] via SUBCUTANEOUS
  Administered 2015-10-13: 4 [IU] via SUBCUTANEOUS
  Administered 2015-10-13 (×2): 3 [IU] via SUBCUTANEOUS
  Administered 2015-10-13 – 2015-10-14 (×5): 4 [IU] via SUBCUTANEOUS
  Administered 2015-10-14: 7 [IU] via SUBCUTANEOUS
  Administered 2015-10-14 (×2): 4 [IU] via SUBCUTANEOUS
  Administered 2015-10-14: 7 [IU] via SUBCUTANEOUS
  Administered 2015-10-15: 4 [IU] via SUBCUTANEOUS
  Administered 2015-10-15 – 2015-10-19 (×10): 3 [IU] via SUBCUTANEOUS
  Administered 2015-10-19: 0 [IU] via SUBCUTANEOUS
  Administered 2015-10-20 (×2): 3 [IU] via SUBCUTANEOUS
  Administered 2015-10-20: 4 [IU] via SUBCUTANEOUS
  Administered 2015-10-20 – 2015-10-24 (×2): 3 [IU] via SUBCUTANEOUS
  Administered 2015-10-24: 4 [IU] via SUBCUTANEOUS
  Administered 2015-10-24 (×2): 3 [IU] via SUBCUTANEOUS
  Administered 2015-10-24 – 2015-10-25 (×4): 4 [IU] via SUBCUTANEOUS

## 2015-10-05 MED ORDER — ASPIRIN 325 MG PO TABS
325.0000 mg | ORAL_TABLET | Freq: Every day | ORAL | Status: DC
  Administered 2015-10-05 – 2015-10-25 (×19): 325 mg
  Filled 2015-10-05 (×20): qty 1

## 2015-10-05 NOTE — Progress Notes (Signed)
Patient ID: Linda [name], female   DOB: [birth year], 60 y.o.   MRN: [id number] Abd soft, PEG in place OK to start TF Continue binder loosely to protect PEG  Violeta Gelinas, MD, MPH, FACS Trauma: [phone] General Surgery: [phone]

## 2015-10-05 NOTE — Progress Notes (Signed)
STROKE TEAM PROGRESS NOTE   SUBJECTIVE (INTERVAL HISTORY) No family  at the bedside. She had tracheostomy and PEG tube yesterday. She still on ventilatory support. Peg tube not yet being used. Neurological condition remains unchanged. Elevated white count likely post procedural OBJECTIVE Temp:  [97.5 F (36.4 C)-101.9 F (38.8 C)] 101.9 F (38.8 C) (02/09 0752) Pulse Rate:  [60-91] 75 (02/09 1100) Cardiac Rhythm:  [-] Normal sinus rhythm (02/09 0800) Resp:  [18-33] 27 (02/09 1100) BP: (87-137)/(54-87) 95/55 mmHg (02/09 1100) SpO2:  [92 %-100 %] 100 % (02/09 1100) FiO2 (%):  [40 %] 40 % (02/09 0800) Weight:  [242 lb 11.6 oz (110.1 kg)] 242 lb 11.6 oz (110.1 kg) (02/09 0200)  CBC:   Recent Labs Lab 09/29/15 0420  10/04/15 0400 10/05/15 0325  WBC 11.3*  < > 12.2* 18.9*  NEUTROABS 6.1  --   --   --   HGB 11.3*  < > 8.9* 10.2*  HCT 34.5*  < > 29.1* 32.6*  MCV 93.0  < > 94.8 94.5  PLT 288  < > 278 PLATELET CLUMPS NOTED ON SMEAR, UNABLE TO ESTIMATE  < > = values in this interval not displayed.  Basic Metabolic Panel:   Recent Labs Lab 10/04/15 0400 10/05/15 0550  NA 147* 149*  K 4.1 4.2  CL 115* 115*  CO2 22 22  GLUCOSE 248* 215*  BUN 69* 72*  CREATININE 1.68* 1.65*  CALCIUM 8.5* 8.4*  MG 2.2 2.3  PHOS 4.9* 4.6    Lipid Panel:     Component Value Date/Time   CHOL 170 09/28/2015 0938   TRIG 245* 10/04/2015 0841   HDL 38* 09/28/2015 0938   CHOLHDL 4.5 09/28/2015 0938   VLDL UNABLE TO CALCULATE IF TRIGLYCERIDE OVER 400 mg/dL 16/05/9603 5409   LDLCALC UNABLE TO CALCULATE IF TRIGLYCERIDE OVER 400 mg/dL 81/19/1478 2956   OZHY8M:  Lab Results  Component Value Date   HGBA1C 6.9* 09/28/2015   Urine Drug Screen:     Component Value Date/Time   LABOPIA NONE DETECTED 09/28/2015 0226   LABBENZ NONE DETECTED 09/28/2015 0226   AMPHETMU NONE DETECTED 09/28/2015 0226   THCU NONE DETECTED 09/28/2015 0226   LABBARB NONE DETECTED 09/28/2015 0226      IMAGING I have  personally reviewed the radiological images below and agree with the radiology interpretations.  Ct Head Wo Contrast 09/28/2015  1. New calcific density in the left sylvian fissure, possible calcific distal M2 embolus. No visible acute infarct or hemorrhage. 2. Advanced chronic small vessel disease with multiple lacunar infarcts.   CTA NECK 09/28/2015   Moderately motion degraded examination, limited by LEFT venous contamination. No definite acute vascular process or hemodynamically significant stenosis. 3.5 x 5.7 cm dominant LEFT thyroid not oral for which follow up thyroid sonogram is recommended on a nonemergent basis.   CTA HEAD 09/28/2015   LEFT M2 segment occlusion corresponding to sylvian fissure density on today's CT, likely acute. Complete circle of Willis.  Mild intracranial atherosclerosis. Moderate calcific atherosclerosis the carotid siphons.   Cerebral Angiogram 09/28/2015 S/P Lt internal carotid arteriogram followed by complete revascularization of occluded dominant inferior division of LT MCA with x pass with trevoprovue 4 mmx 30 mm retrieval device and 6 mg of superselective Integrelin achieving a TICI 3 flow.  MRI  Large acute hemorrhagic left hemispheric infarct involves the majority of the left middle cerebral artery distribution. Hemorrhage is most notable involving the left posterior temporal -parietal aspect of this acute infarct. Local mass  effect upon the left lateral ventricle. At most there is minimal bowing of the septum to the right. Several small acute nonhemorrhagic right hemispheric infarcts involving right caudate head, superior aspect of the post limb right internal capsule, right frontal lobe, superior right lenticular nucleus and right occipital lobe. Several small acute nonhemorrhagic cerebellar infarcts greater on the right. Remote left lenticular nucleus infarct or hematoma with blood-stained cleft now noted. Moderate to marked chronic small vessel  disease changes. Atrophy.  EEG Abnormalities: 1) possible attenuation of sleep structures in the left central region 2) lack of waking state Clinical Interpretation: This EEG is consistent with a sleep/sedation EEG with a possible suggestion of a left central cerebral dysfunction, though I don't think this is definite by this study.  2D echo - Systolic function was normal. The estimated ejection fraction was in the range of 55% to 60%. Wall motion was normal; there were no regional wall motion abnormalities. Doppler parameters are consistent with abnormal left ventricular relaxation (grade 1 diastolic dysfunction).  LE venous doppler -   no DVT    Physical exam  Temp:  [97.5 F (36.4 C)-101.9 F (38.8 C)] 101.9 F (38.8 C) (02/09 0752) Pulse Rate:  [60-91] 75 (02/09 1100) Resp:  [18-33] 27 (02/09 1100) BP: (87-137)/(54-87) 95/55 mmHg (02/09 1100) SpO2:  [92 %-100 %] 100 % (02/09 1100) FiO2 (%):  [40 %] 40 % (02/09 0800) Weight:  [242 lb 11.6 oz (110.1 kg)] 242 lb 11.6 oz (110.1 kg) (02/09 0200)  General - obese, well developed, status post tracheostomy versed gtt  HEENT:  NCAT; sclera slightly icteric;   Cardiovascular - Regular rate and rhythm. Pulm:  Scattered rhonchi Abd:  Obese; ND, depressed bowel sounds. Peg tube Ext:  No C/C/E  Neurologic Exam MENTAL STATUS: Status post tracheostomy and on versed Not responsive to voice, does not open eyes.  Pupils 3mm, poorly reactive to light, slight doll's eye with head movement, weak corneal and strong gag reflexes, breathing over the vent.   MOTOR/SENSORY: On painful stimulation, she has weak withdrawal of right UE and no response in the right LE; she spontaneously moves the left UE and LE.Tone diminished on right and normal on left  Coordination or gait can not able to test.   ASSESSMENT/PLAN Linda [name] is a 60 y.o. female with history of left BG ICH in 2012, hypertension, cocaine abuse, renal failure  and hepatitis C presenting with altered mental status, right side weakness and leftward gaze. Also concerning for possible seizure activity initial presentation. She did not receive IV t-PA due to hx ICH, CTA showed a L M2 occlusion, she was sent to IR where she received complete TICI3 revascularization of the L MCA with mechanical thrombectomy with trevo and IA Integrilin.  Stroke:  left MCA infarct with hemorrhagic transformation s/p revascularizationf of L M2, multifocal scattered infarcts involving bilateral posterior and anterior circulation, consistent with cardioembolic source although pt does have significant large vessel intracranial atherosclerosis as well as cocaine abuse.  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  CTA neck L thyroid nodule  2D Echo  Results above  LE venous doppler preliminary negative  LDL unable to calculate  due to high TG   HgbA1c 6.9  UDS positive for cocaine  Heparin subq for VTE prophylaxis Diet NPO time specified  No antithrombotic prior to admission, now on ASA .   Ongoing aggressive stroke risk factor management  Therapy recommendations:  pending   Disposition:  pending   ? Seizure  Questionable seizure presentation initially as reported in chart  EEG showed no seizure but under sedation  Hold off AEDs at this time  on versed drip  Hx of ICH  2012 left BG ICH; during hospital course:  BP high at 200s  Urine positive with cocaine   S/p trach and PEG at that time  Lost follow up since  Acute respiratory failure  Intubated in ED due to airway protection  On versed  with ventilation  CCM following  Hypertensive Emergency  BP as high as 202/124  off cardene drip  Stable now with tight parameters post IR < 160  Hyperlipidemia  Home meds:  No statin  LDL not able to calculate due to high TG , goal < 70  Total chol 170  Consider statin once po access.  Cocaine abuse  urine positive both in 2012 and this admission  Likely one of the causes of intracranial vascular stenosis  Need cessation counseling   Other Stroke Risk Factors  Obesity  Other Active Problems  Hepatitis C  Hx Dysphagia secondary to ICH in 2012 with PEG  Leukocytosis and fever:  Fever Tm 101.3; WBCs 18.7->11.3-> 13  Hospital day # 7  This patient is critically ill due to large left MCA infarct with left M2 occlusion s/p intervention, hemorrhagic transformation and at significant risk of neurological worsening, death form recurrent infarcts, increased ICH, brain herniation, cerebral edema, heart failure. This patient's care requires constant monitoring of vital signs, hemodynamics, respiratory and cardiac monitoring, review of multiple databases, neurological assessment, discussion with family, other specialists and medical decision making of high complexity. I spent 32 minutes of neurocritical care time in the care of this patient.   Prognosis is quite poor and will likely need prolonged ventilatory support and transferred to long-term care facility over the next week or so family not available at the bedside..  . D/w Dr Craige Cotta  Delia Heady, MD Stroke Neurology 10/05/2015 11:55 AM      To contact Stroke Continuity provider, please refer to [email or web address]. After hours, contact General Neurology

## 2015-10-05 NOTE — Progress Notes (Signed)
PULMONARY / CRITICAL CARE MEDICINE   Name: Linda [name] MRN: [id number] DOB: 06/26/1956    ADMISSION DATE:  09/28/2015 CONSULTATION DATE:  2/2  REFERRING MD:  Neuro  CHIEF COMPLAINT:  Obtunded   SUBJECTIVE:  Tolerates some pressure support.  VITAL SIGNS: BP 111/55 mmHg  Pulse 91  Temp(Src) 97.5 F (36.4 C) (Axillary)  Resp 33  Ht  (1.702 m)  Wt 242 lb 11.6 oz (110.1 kg)  BMI 38.01 kg/m2  SpO2 100%  VENTILATOR SETTINGS: Vent Mode:  [-] PRVC FiO2 (%):  [30 %-40 %] 40 % Set Rate:  [20 bmp] 20 bmp Vt Set:  [490 mL] 490 mL PEEP:  [5 cmH20] 5 cmH20 Plateau Pressure:  [17 cmH20-21 cmH20] 17 cmH20  INTAKE / OUTPUT: I/O last 3 completed shifts: In: 2374.1 [I.V.:1274.1; Other:730; NG/GT:120; IV Piggyback:250] Out: 250 [Drains:250]  PHYSICAL EXAMINATION: General: on vent Neuro: opens eyes with stimulation HEENT: trach site clean Cardiac: regular Chest: no wheeze Abd: g tube site clean Ext: 1+ edema Skin: no rashes  LABS:  BMET  Recent Labs Lab 10/03/15 1030 10/04/15 0400 10/05/15 0550  NA 145 147* 149*  K 4.2 4.1 4.2  CL 113* 115* 115*  CO2 21* 22 22  BUN 60* 69* 72*  CREATININE 1.48* 1.68* 1.65*  GLUCOSE 282* 248* 215*   Electrolytes  Recent Labs Lab 10/03/15 1030 10/04/15 0400 10/05/15 0550  CALCIUM 8.5* 8.5* 8.4*  MG 2.1 2.2 2.3  PHOS 3.9 4.9* 4.6   CBC  Recent Labs Lab 10/03/15 1030 10/04/15 0400 10/05/15 0325  WBC 10.1 12.2* 18.9*  HGB 9.4* 8.9* 10.2*  HCT 28.4* 29.1* 32.6*  PLT 264 278 PLATELET CLUMPS NOTED ON SMEAR, UNABLE TO ESTIMATE   Coag's  Recent Labs Lab 10/04/15 0400  APTT 29  INR 1.16   Sepsis Markers  Recent Labs Lab 09/28/15 0938 09/29/15 0420 09/30/15 0350  LATICACIDVEN 1.5  --   --   PROCALCITON <0.10 0.10 0.10   ABG  Recent Labs Lab 10/03/15 0450 10/04/15 0416 10/05/15 0424  PHART 7.409 7.412 7.422  PCO2ART 32.4* 32.7* 29.9*  PO2ART 60.3* 63.2* 101*   Glucose  Recent Labs Lab  10/04/15 0746 10/04/15 1210 10/04/15 1535 10/04/15 1942 10/04/15 2331 10/05/15 0328  GLUCAP 229* 253* 243* 140* 181* 191*   Imaging Dg Chest Port 1 View  10/04/2015  CLINICAL DATA:  Tracheostomy EXAM: PORTABLE CHEST 1 VIEW COMPARISON:  Chest x-rays dated 10/04/2015 and 10/03/2015. FINDINGS: Tracheostomy has been placed and appears well positioned with tip just above the level of the carina. Cardiomediastinal silhouette is stable in size and configuration. Patchy opacities within the left lower lung are not significantly changed. Additional smaller opacities now appreciated at the right lung base. No pneumothorax seen. IMPRESSION: 1. Tracheostomy now in place with tip well positioned just above the level of the carina. 2. Patchy airspace opacities at the left lung base, not significantly changed in the short-term interval, compatible with either pneumonia or asymmetric edema. Also suspect small left pleural effusion. 3. New smaller opacities at the right lung base, most likely atelectasis. Electronically Signed   By: Bary Richard M.D.   On: 10/04/2015 16:08   STUDIES:  EEG 2/02 >> consistent with a sleep/sedation EEG with a possible suggestion of a left central cerebral dysfunction Echo 2/03 >> EF 55 to 60%, grade 1 diastolic dysfx Doppler legs b/l 2/04 >> no DVT  CULTURES: 2/2 Blood >> negative 2/2 Urine >> negative 2/2 Sputum >> oral flora 2/4  Sputum >> MRSA 2/4 Blood >> Coag neg Staph  ANTIBIOTICS: 2/7 Vancomycin >>   SIGNIFICANT EVENTS: 2/2 Admit, neuro IR >> revascularization of Lt MCA inferior division 2/7 Trach, G tube  LINES/TUBES: 2/02 ETT >> 2/07 2/07 Janina Mayo Ninet[facility] Lights) >>  2/07 Gtube (CCS) >>  DISCUSSION: 60 yo female with Lt MCA CVA, VDRF.  UDS positive for cocaine.  Failed to wean from vent due to mental status, and required tracheostomy.  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  ASSESSMENT /  PLAN:  NEUROLOGY A: Acute encephalopathy 2nd to Lt MCA CVA and cocaine. P: Wean off precedex to keep RASS 0 Continue ASA  PULMONARY A: Compromised airway in setting of CVA. Failure to wean from vent s/p tracheostomy 2/07. P:   Pressure support wean to trach collar as tolerated F/u CXR intermittently  CARDIOVASCULAR A:  Hx of HTN, exacerbated by cocaine use.  P:  Goal SBP < 160   RENAL A:   CKD stage 2 >> baseline renal fx 1.27 from 02/28/14. Hypernatremia. P:   Monitor renal fx, urine outpt Continue free water, f/u BMET  GASTROINTESTINAL A:   Dysphagia s/p G tube 2/07. P:   Tube feeds Protonix for SUP  HEMATOLOGIC A:   Anemia of critical illness. P:  F/u CBC intermittently SQ heparin, SCD's for DVT prevention  INFECTIOUS A:   Fever with HCAP from MRSA, and Coag neg Staph in Blood cx from 2/04. P:   Day 3 of vancomycin Repeat blood cx 2/09  ENDOCRINE A:   DM type II. P:   SSI Add lantus 2/09  Disposition >> will likely need LTAC, depending on her vent requirements.  CC time 32 minutes.  Coralyn Helling, MD [facility] Pulmonary/Critical Care 10/05/2015, 8:06 AM Pager:  [phone] After 3pm call: [phone]

## 2015-10-06 ENCOUNTER — Inpatient Hospital Stay (HOSPITAL_COMMUNITY): Payer: Medicaid Other

## 2015-10-06 DIAGNOSIS — I6330 Cerebral infarction due to thrombosis of unspecified cerebral artery: Secondary | ICD-10-CM | POA: Insufficient documentation

## 2015-10-06 LAB — GLUCOSE, CAPILLARY
GLUCOSE-CAPILLARY: 286 mg/dL — AB (ref 65–99)
GLUCOSE-CAPILLARY: 278 mg/dL — AB (ref 65–99)
Glucose-Capillary: 177 mg/dL — ABNORMAL HIGH (ref 65–99)
Glucose-Capillary: 194 mg/dL — ABNORMAL HIGH (ref 65–99)
Glucose-Capillary: 204 mg/dL — ABNORMAL HIGH (ref 65–99)
Glucose-Capillary: 244 mg/dL — ABNORMAL HIGH (ref 65–99)

## 2015-10-06 LAB — PROCALCITONIN: Procalcitonin: 0.37 ng/mL

## 2015-10-06 LAB — BASIC METABOLIC PANEL
Anion gap: 10 (ref 5–15)
BUN: 62 mg/dL — ABNORMAL HIGH (ref 6–20)
CHLORIDE: 119 mmol/L — AB (ref 101–111)
CO2: 23 mmol/L (ref 22–32)
Calcium: 8.6 mg/dL — ABNORMAL LOW (ref 8.9–10.3)
Creatinine, Ser: 1.55 mg/dL — ABNORMAL HIGH (ref 0.44–1.00)
GFR calc non Af Amer: 35 mL/min — ABNORMAL LOW (ref >60)
GFR, EST AFRICAN AMERICAN: 41 mL/min — AB (ref >60)
Glucose, Bld: 295 mg/dL — ABNORMAL HIGH (ref 65–99)
POTASSIUM: 3.9 mmol/L (ref 3.5–5.1)
SODIUM: 152 mmol/L — AB (ref 135–145)

## 2015-10-06 MED ORDER — SODIUM CHLORIDE 0.9% FLUSH
10.0000 mL | Freq: Two times a day (BID) | INTRAVENOUS | Status: DC
  Administered 2015-10-06: 10 mL
  Administered 2015-10-06: 20 mL
  Administered 2015-10-07: 30 mL
  Administered 2015-10-07 – 2015-10-25 (×30): 10 mL

## 2015-10-06 MED ORDER — DEXTROSE 5 % IV SOLN
1.0000 g | Freq: Two times a day (BID) | INTRAVENOUS | Status: DC
  Administered 2015-10-06 – 2015-10-07 (×4): 1 g via INTRAVENOUS
  Filled 2015-10-06 (×5): qty 1

## 2015-10-06 MED ORDER — LORAZEPAM 2 MG/ML IJ SOLN
1.0000 mg | INTRAMUSCULAR | Status: DC | PRN
  Administered 2015-10-06 – 2015-10-08 (×3): 2 mg via INTRAMUSCULAR
  Filled 2015-10-06 (×3): qty 1

## 2015-10-06 MED ORDER — SODIUM CHLORIDE 0.9% FLUSH: 10.0000 mL | INTRAVENOUS | Status: DC | PRN

## 2015-10-06 MED ORDER — FENTANYL CITRATE (PF) 100 MCG/2ML IJ SOLN
INTRAMUSCULAR | Status: AC
  Administered 2015-10-06: 05:00:00
  Filled 2015-10-06: qty 2

## 2015-10-06 NOTE — Progress Notes (Signed)
CRITICAL VALUE ALERT  Critical value received:  Blood culture, Gram + cocci in clusters Date of notification:  10/06/15  Time of notification:  1130  Critical value read back:yes Nurse who received alert:  Lily Peer  MD notified (1st page):  Nandigam   Time of first page: 1131   Responding MD:  Lavon Paganini Time MD responded:  [phone]

## 2015-10-06 NOTE — Procedures (Signed)
Central Venous Catheter Insertion Procedure Note Linda [name] [id number] 05-Aug-1956  Procedure: Insertion of Central Venous Catheter Indications: Assessment of intravascular volume, Drug and/or fluid administration and Frequent blood sampling  Procedure Details Consent: Unable to obtain consent because of altered level of consciousness. Time Out: Verified patient identification, verified procedure, site/side was marked, verified correct patient position, special equipment/implants available, medications/allergies/relevent history reviewed, required imaging and test results available.  Performed  Maximum sterile technique was used including antiseptics, cap, gloves, gown, hand hygiene, mask and sheet. Skin prep: Chlorhexidine; local anesthetic administered A antimicrobial bonded/coated triple lumen catheter was placed in the right internal jugular vein using the Seldinger technique.  Evaluation Blood flow good Complications: No apparent complications Patient did tolerate procedure well. Chest X-ray ordered to verify placement.  CXR: pending.  Procedure performed under direct ultrasound guidance for real time vessel cannulation.      [name], [place] [facility] Pager: [phone]  or [phone] 10/06/2015, 4:07 AM

## 2015-10-06 NOTE — Progress Notes (Signed)
STROKE TEAM PROGRESS NOTE   SUBJECTIVE (INTERVAL HISTORY) No family  Is at the bedside. She had tracheostomy and PEG tube 10/04/15. She still on ventilatory support. Peg tube working well. Neurological condition remains unchanged.   OBJECTIVE Temp:  [97.9 F (36.6 C)-101.4 F (38.6 C)] 99.3 F (37.4 C) (02/10 1200) Pulse Rate:  [71-112] 77 (02/10 1135) Cardiac Rhythm:  [-] Normal sinus rhythm (02/10 0800) Resp:  [0-39] 35 (02/10 1135) BP: (93-139)/(51-84) 93/53 mmHg (02/10 1135) SpO2:  [85 %-100 %] 98 % (02/10 1135) FiO2 (%):  [40 %] 40 % (02/10 1135) Weight:  [237 lb 14 oz (107.9 kg)] 237 lb 14 oz (107.9 kg) (02/10 0449)  CBC:   Recent Labs Lab 10/04/15 0400 10/05/15 0325  WBC 12.2* 18.9*  HGB 8.9* 10.2*  HCT 29.1* 32.6*  MCV 94.8 94.5  PLT 278 PLATELET CLUMPS NOTED ON SMEAR, UNABLE TO ESTIMATE    Basic Metabolic Panel:   Recent Labs Lab 10/04/15 0400 10/05/15 0550  NA 147* 149*  K 4.1 4.2  CL 115* 115*  CO2 22 22  GLUCOSE 248* 215*  BUN 69* 72*  CREATININE 1.68* 1.65*  CALCIUM 8.5* 8.4*  MG 2.2 2.3  PHOS 4.9* 4.6    Lipid Panel:     Component Value Date/Time   CHOL 170 09/28/2015 0938   TRIG 245* 10/04/2015 0841   HDL 38* 09/28/2015 0938   CHOLHDL 4.5 09/28/2015 0938   VLDL UNABLE TO CALCULATE IF TRIGLYCERIDE OVER 400 mg/dL 16/05/9603 5409   LDLCALC UNABLE TO CALCULATE IF TRIGLYCERIDE OVER 400 mg/dL 81/19/1478 2956   OZHY8M:  Lab Results  Component Value Date   HGBA1C 6.9* 09/28/2015   Urine Drug Screen:     Component Value Date/Time   LABOPIA NONE DETECTED 09/28/2015 0226   LABBENZ NONE DETECTED 09/28/2015 0226   AMPHETMU NONE DETECTED 09/28/2015 0226   THCU NONE DETECTED 09/28/2015 0226   LABBARB NONE DETECTED 09/28/2015 0226      IMAGING I have personally reviewed the radiological images below and agree with the radiology interpretations.  Ct Head Wo Contrast 09/28/2015  1. New calcific density in the left sylvian fissure, possible  calcific distal M2 embolus. No visible acute infarct or hemorrhage. 2. Advanced chronic small vessel disease with multiple lacunar infarcts.   CTA NECK 09/28/2015   Moderately motion degraded examination, limited by LEFT venous contamination. No definite acute vascular process or hemodynamically significant stenosis. 3.5 x 5.7 cm dominant LEFT thyroid not oral for which follow up thyroid sonogram is recommended on a nonemergent basis.   CTA HEAD 09/28/2015   LEFT M2 segment occlusion corresponding to sylvian fissure density on today's CT, likely acute. Complete circle of Willis.  Mild intracranial atherosclerosis. Moderate calcific atherosclerosis the carotid siphons.   Cerebral Angiogram 09/28/2015 S/P Lt internal carotid arteriogram followed by complete revascularization of occluded dominant inferior division of LT MCA with x pass with trevoprovue 4 mmx 30 mm retrieval device and 6 mg of superselective Integrelin achieving a TICI 3 flow.  MRI  Large acute hemorrhagic left hemispheric infarct involves the majority of the left middle cerebral artery distribution. Hemorrhage is most notable involving the left posterior temporal -parietal aspect of this acute infarct. Local mass effect upon the left lateral ventricle. At most there is minimal bowing of the septum to the right. Several small acute nonhemorrhagic right hemispheric infarcts involving right caudate head, superior aspect of the post limb right internal capsule, right frontal lobe, superior right lenticular nucleus and right occipital  lobe. Several small acute nonhemorrhagic cerebellar infarcts greater on the right. Remote left lenticular nucleus infarct or hematoma with blood-stained cleft now noted. Moderate to marked chronic small vessel disease changes. Atrophy.  EEG Abnormalities: 1) possible attenuation of sleep structures in the left central region 2) lack of waking state Clinical Interpretation: This EEG is consistent  with a sleep/sedation EEG with a possible suggestion of a left central cerebral dysfunction, though I don't think this is definite by this study.  2D echo - Systolic function was normal. The estimated ejection fraction was in the range of 55% to 60%. Wall motion was normal; there were no regional wall motion abnormalities. Doppler parameters are consistent with abnormal left ventricular relaxation (grade 1 diastolic dysfunction).  LE venous doppler -   no DVT    Physical exam  Temp:  [97.9 F (36.6 C)-101.4 F (38.6 C)] 99.3 F (37.4 C) (02/10 1200) Pulse Rate:  [71-112] 77 (02/10 1135) Resp:  [0-39] 35 (02/10 1135) BP: (93-139)/(51-84) 93/53 mmHg (02/10 1135) SpO2:  [85 %-100 %] 98 % (02/10 1135) FiO2 (%):  [40 %] 40 % (02/10 1135) Weight:  [237 lb 14 oz (107.9 kg)] 237 lb 14 oz (107.9 kg) (02/10 0449)  General - obese, well developed, status post tracheostomy versed gtt  HEENT:  NCAT; sclera slightly icteric;   Cardiovascular - Regular rate and rhythm. Pulm:  Scattered rhonchi Abd:  Obese; ND, depressed bowel sounds. Peg tube Ext:  No C/C/E. Dystrophic nails bilateral feet  Neurologic Exam MENTAL STATUS: Status post tracheostomy and on versed Not responsive to voice, does not open eyes.  Pupils 3mm, poorly reactive to light, slight doll's eye with head movement, weak corneal and strong gag reflexes, breathing over the vent.   MOTOR/SENSORY: On painful stimulation, she has weak withdrawal of right UE and no response in the right LE; she spontaneously moves the left UE and LE.Tone diminished on right and normal on left  Coordination or gait can not able to test.   ASSESSMENT/PLAN [name] is a 60 y.o. female with history of left BG ICH in 2012, hypertension, cocaine abuse, renal failure and hepatitis C presenting with altered mental status, right side weakness and leftward gaze. Also concerning for possible seizure activity initial presentation. She did  not receive IV t-PA due to hx ICH, CTA showed a L M2 occlusion, she was sent to IR where she received complete TICI3 revascularization of the L MCA with mechanical thrombectomy with trevo and IA Integrilin.  Stroke:  left MCA infarct with hemorrhagic transformation s/p revascularizationf of L M2, multifocal scattered infarcts involving bilateral posterior and anterior circulation, consistent with cardioembolic source although pt does have significant large vessel intracranial atherosclerosis as well as cocaine abuse.  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  CTA neck L thyroid nodule  2D Echo  Results above  LE venous doppler preliminary negative  LDL unable to calculate due to high TG   HgbA1c 6.9  UDS positive for cocaine  Heparin subq for VTE prophylaxis Diet NPO time specified  No antithrombotic prior to admission, now on ASA .   Ongoing aggressive stroke risk factor management  Therapy recommendations:  pending   Disposition:  pending   ? Seizure  Questionable seizure presentation initially as reported in chart  EEG showed no seizure but under sedation  Hold off AEDs at this time  on versed drip  Hx of ICH  2012 left BG ICH; during hospital course:  BP high at 200s  Urine positive with cocaine   S/p trach and PEG at that time  Lost follow up since  Acute respiratory failure  Intubated in ED due to airway protection  On versed with ventilation  CCM following  Hypertensive Emergency  BP as high as 202/124  off cardene drip  Stable now with tight parameters post IR <  160  Hyperlipidemia  Home meds:  No statin  LDL not able to calculate due to high TG , goal < 70  Total chol 170  Consider statin once po access.  Cocaine abuse  urine positive both in 2012 and this admission  Likely one of the causes of intracranial vascular stenosis  Need cessation counseling   Other Stroke Risk Factors  Obesity  Other Active Problems  Hepatitis C  Hx Dysphagia secondary to ICH in 2012 with PEG  Leukocytosis and fever:  Fever Tm 101.3; WBCs 18.7->11.3-> 13  Hospital day # 8  This patient is critically ill due to large left MCA infarct with left M2 occlusion s/p intervention, hemorrhagic transformation and at significant risk of neurological worsening, death form recurrent infarcts, increased ICH, brain herniation, cerebral edema, heart failure. This patient's care requires constant monitoring of vital signs, hemodynamics, respiratory and cardiac monitoring, review of multiple databases, neurological assessment, discussion with family, other specialists and medical decision making of high complexity. I spent 30 minutes of neurocritical care time in the care of this patient.   Prognosis is quite poor and will likely need prolonged ventilatory support and transfer  to long-term care facility over the next week or so family not available at the bedside..  . D/w Dr Craige Cotta  Delia Heady, MD Stroke Neurology 10/06/2015 1:25 PM      To contact Stroke Continuity provider, please refer to [email or web address]. After hours, contact General Neurology

## 2015-10-06 NOTE — Progress Notes (Signed)
eLink Physician-Brief Progress Note Patient Name: Linda [name] DOB: [birth year] MRN: [id number]   Date of Service  10/06/2015  HPI/Events of Note  Patient lost IV access for Precedex. IV team consult placed & pending arrival.  eICU Interventions  Ativan IM prn for sedation until IV established     Intervention Category Intermediate Interventions: Other:  [name] 10/06/2015, 2:35 AM

## 2015-10-06 NOTE — Progress Notes (Signed)
Pt s/p tracheostomy and PEG on 10/04/15.  Pt unable to wean from vent; remains on pressure support, and continuing weaning attempts.  Will continue to follow progress.  CSW following for SNF--may need vent SNF out of area if unable to wean.    Quintella Baton, RN, BSN  Trauma/Neuro ICU Case Manager [phone]

## 2015-10-06 NOTE — Progress Notes (Signed)
eLink Physician-Brief Progress Note Patient Name: ELEASE SWARM DOB: Jul 11, 1956 MRN: [id number]   Date of Service  10/06/2015  HPI/Events of Note  Blood Cultures X 2 from 10/05/2015 are positive for GPC's in clusters. Currently on Vancomycin and Fortaz. Vancomycin should cover GPC's in clusters.  eICU Interventions  Continue present management.     Intervention Category Major Interventions: Infection - evaluation and management  Aliece Honold Eugene 10/06/2015, 3:58 PM

## 2015-10-06 NOTE — Progress Notes (Signed)
Pharmacy Antibiotic Note  Linda [name] is a 60 y.o. female on day #4 vancomycin for CoNS bacteremia and MRSA pneumonia, now with increased respiratory secretions and continued fevers (Tmax 101.4 today). Pharmacy has been consulted to add ceftazidime.  Plan: 1) Ceftazidime 1g IV q12 2) Continue vancomycin  IV q24 - will check trough tomorrow  Height:  (170.2 cm) Weight: 237 lb 14 oz (107.9 kg) IBW/kg (Calculated) : 61.6  Temp (24hrs), Avg:99 F (37.2 C), Min:97.9 F (36.6 C), Max:101.4 F (38.6 C)   Recent Labs Lab 10/01/15 0515 10/02/15 0552 10/03/15 1030 10/04/15 0400 10/05/15 0325 10/05/15 0550  WBC 11.2* 11.3* 10.1 12.2* 18.9*  --   CREATININE 1.14* 1.36* 1.48* 1.68*  --  1.65*    Estimated Creatinine Clearance: 45.8 mL/min (by C-G formula based on Cr of 1.65).    No Known Allergies  Antimicrobials this admission: Vancomycin 2/7>> Ceftazidime 2/10>>  Dose adjustments this admission: N/A  Microbiology results:  2/9 Blood - NGTD 2/4 Blood 2/2 CoNS 2/4 TA - MRSA 2/2 Blood -NEG 2/2 TA - NEG 2/2 Urine - NEG 2/2 MRSA - NEG  Thank you for allowing pharmacy to be a part of this patient's care.  Fredrik Rigger 10/06/2015 11:13 AM

## 2015-10-06 NOTE — Progress Notes (Signed)
CRITICAL VALUE ALERT  Critical value received:  Blood cultures 10/05/2015 @ 1117 = Gram + cocci in clusters  Date of notification: 10/06/2015  Time of notification:  1555  Critical value read back: yes  Nurse who received alert:  Londell Moh, RN  MD notified (1st page):  Dr. Arsenio Loader (via elink)   Time of first page:  1556  MD notified (2nd page):  Time of second page:  Responding MD:  Dr. Arsenio Loader Time MD responded:  1556

## 2015-10-06 NOTE — Progress Notes (Signed)
PULMONARY / CRITICAL CARE MEDICINE   Name: Linda [name] MRN: [id number] DOB: December 21, 1955    ADMISSION DATE:  09/28/2015 CONSULTATION DATE:  2/2  REFERRING MD:  Neuro  CHIEF COMPLAINT:  Obtunded   SUBJECTIVE:  Tolerates some pressure support.  Still having fever, and increased respiratory secretions.  VITAL SIGNS: BP 106/56 mmHg  Pulse 104  Temp(Src) 101.4 F (38.6 C) (Oral)  Resp 24  Ht  (1.702 m)  Wt 237 lb 14 oz (107.9 kg)  BMI 37.25 kg/m2  SpO2 96%  VENTILATOR SETTINGS: Vent Mode:  [-] PSV;CPAP FiO2 (%):  [40 %] 40 % Set Rate:  [20 bmp] 20 bmp Vt Set:  [490 mL] 490 mL PEEP:  [5 cmH20] 5 cmH20 Pressure Support:  [8 cmH20] 8 cmH20 Plateau Pressure:  [20 cmH20-22 cmH20] 22 cmH20  INTAKE / OUTPUT: I/O last 3 completed shifts: In: 2790.2 [I.V.:708.3; Other:620; NG/GT:1211.8; IV Piggyback:250] Out: 400 [Drains:400]  PHYSICAL EXAMINATION: General: on vent Neuro: opens eyes with stimulation HEENT: trach site clean Cardiac: regular Chest: no wheeze Abd: g tube site clean Ext: 1+ edema Skin: no rashes  LABS:  BMET  Recent Labs Lab 10/03/15 1030 10/04/15 0400 10/05/15 0550  NA 145 147* 149*  K 4.2 4.1 4.2  CL 113* 115* 115*  CO2 21* 22 22  BUN 60* 69* 72*  CREATININE 1.48* 1.68* 1.65*  GLUCOSE 282* 248* 215*   Electrolytes  Recent Labs Lab 10/03/15 1030 10/04/15 0400 10/05/15 0550  CALCIUM 8.5* 8.5* 8.4*  MG 2.1 2.2 2.3  PHOS 3.9 4.9* 4.6   CBC  Recent Labs Lab 10/03/15 1030 10/04/15 0400 10/05/15 0325  WBC 10.1 12.2* 18.9*  HGB 9.4* 8.9* 10.2*  HCT 28.4* 29.1* 32.6*  PLT 264 278 PLATELET CLUMPS NOTED ON SMEAR, UNABLE TO ESTIMATE   Coag's  Recent Labs Lab 10/04/15 0400  APTT 29  INR 1.16   Sepsis Markers  Recent Labs Lab 09/30/15 0350  PROCALCITON 0.10   ABG  Recent Labs Lab 10/03/15 0450 10/04/15 0416 10/05/15 0424  PHART 7.409 7.412 7.422  PCO2ART 32.4* 32.7* 29.9*  PO2ART 60.3* 63.2* 101*    Glucose  Recent Labs Lab 10/05/15 1141 10/05/15 1544 10/05/15 2005 10/05/15 2347 10/06/15 0335 10/06/15 0832  GLUCAP 237* 207* 212* 204* 177* 194*   Imaging Dg Chest Port 1 View  10/06/2015  CLINICAL DATA:  Central line placement.  Initial encounter. EXAM: PORTABLE CHEST 1 VIEW COMPARISON:  Chest radiograph performed 10/05/2015 FINDINGS: The patient's right IJ line is noted ending about the distal SVC. A tracheostomy tube is seen ending 4 cm above the carina. Mild vascular congestion is noted. Left perihilar opacity could reflect asymmetric interstitial edema or possibly mild pneumonia. No pleural effusion or pneumothorax is seen. The cardiomediastinal silhouette is borderline normal in size. No acute osseous abnormalities are identified. Evaluation is mildly suboptimal due to patient rotation. IMPRESSION: 1. Right IJ line noted ending about the distal SVC. 2. Mild vascular congestion noted. Left perihilar airspace opacity may reflect asymmetric interstitial edema or possibly mild pneumonia. Electronically Signed   By: Roanna Raider M.D.   On: 10/06/2015 04:51   STUDIES:  EEG 2/02 >> consistent with a sleep/sedation EEG with a possible suggestion of a left central cerebral dysfunction Echo 2/03 >> EF 55 to 60%, grade 1 diastolic dysfx Doppler legs b/l 2/04 >> no DVT  CULTURES: 2/2 Blood >> negative 2/2 Urine >> negative 2/2 Sputum >> oral flora 2/4 Sputum >> MRSA 2/4 Blood >> Coag  neg Staph 2/9 Blood >> 2/10 Sputum >>  ANTIBIOTICS: 2/7 Vancomycin >>  2/10 Fortaz >>   SIGNIFICANT EVENTS: 2/2 Admit, neuro IR >> revascularization of Lt MCA inferior division 2/7 Trach, G tube  LINES/TUBES: 2/02 ETT >> 2/07 2/07 Trach (JY) >>  2/07 Gtube (CCS) >> 2/10 Rt IJ CVL >>  DISCUSSION: 60 yo female with Lt MCA CVA, VDRF.  UDS positive for cocaine.  Failed to wean from vent due to mental status, and required tracheostomy.  She has prior hx of hemorrhagic CVA in 2012 with Rt sided  weakness, hepatitis C, HTN, HLD, DM.  ASSESSMENT / PLAN:  NEUROLOGY A: Acute encephalopathy 2nd to Lt MCA CVA and cocaine. P: Wean off precedex to keep RASS 0 Continue ASA  PULMONARY A: Compromised airway in setting of CVA. Failure to wean from vent s/p tracheostomy 2/07. P:   Pressure support wean to trach collar as tolerated F/u CXR  CARDIOVASCULAR A:  Hx of HTN, exacerbated by cocaine use.  P:  Goal SBP < 160   RENAL A:   CKD stage 2 >> baseline renal fx 1.27 from 02/28/14. Hypernatremia. P:   Monitor renal fx, urine outpt Continue free water, f/u BMET  GASTROINTESTINAL A:   Dysphagia s/p G tube 2/07. P:   Tube feeds Protonix for SUP  HEMATOLOGIC A:   Anemia of critical illness. P:  F/u CBC intermittently SQ heparin, SCD's for DVT prevention  INFECTIOUS A:   Fever with HCAP from MRSA, and Coag neg Staph in Blood cx from 2/04. P:   Day 4 of vancomycin Repeated blood cx 2/09 Repeat sputum cx 2/10 Check procalcitonin Add fortaz 2/10  ENDOCRINE A:   DM type II. P:   SSI Added lantus 2/09  Disposition >> will likely need LTAC, depending on her vent requirements.  D/w Dr. Pearlean Brownie.  CC time 35 minutes.  Coralyn Helling, MD [facility] Pulmonary/Critical Care 10/06/2015, 10:56 AM Pager:  [phone] After 3pm call: [phone]

## 2015-10-06 NOTE — Progress Notes (Signed)
eLink Physician-Brief Progress Note Patient Name: Linda [name] DOB: June 15, 1956 MRN: [id number]   Date of Service  10/06/2015  HPI/Events of Note  Evaluated portable CXR after R IJ CVL placement. Good position within distal SVC.  eICU Interventions  Ok to use central line & order set placed.     Intervention Category Intermediate Interventions: Diagnostic test evaluation  [name] 10/06/2015, 4:38 AM

## 2015-10-06 NOTE — Progress Notes (Signed)
Pt became extremely agitated, tachypnic, tachycardic. Precedex titrated per order with no relief. IV access checked and noted to be leaking. Secondary iv access infiltrated when flushed. IV team consult placed, CCM notified of situation. New orders received and carried out. Will continue to monitor. Dicie Beam RN BSN

## 2015-10-07 ENCOUNTER — Inpatient Hospital Stay (HOSPITAL_COMMUNITY): Payer: Medicaid Other

## 2015-10-07 DIAGNOSIS — Q251 Coarctation of aorta: Secondary | ICD-10-CM | POA: Insufficient documentation

## 2015-10-07 DIAGNOSIS — E870 Hyperosmolality and hypernatremia: Secondary | ICD-10-CM

## 2015-10-07 DIAGNOSIS — R7881 Bacteremia: Secondary | ICD-10-CM

## 2015-10-07 DIAGNOSIS — D72829 Elevated white blood cell count, unspecified: Secondary | ICD-10-CM

## 2015-10-07 DIAGNOSIS — I6300 Cerebral infarction due to thrombosis of unspecified precerebral artery: Secondary | ICD-10-CM

## 2015-10-07 DIAGNOSIS — I6350 Cerebral infarction due to unspecified occlusion or stenosis of unspecified cerebral artery: Secondary | ICD-10-CM | POA: Insufficient documentation

## 2015-10-07 LAB — BASIC METABOLIC PANEL
Anion gap: 10 (ref 5–15)
BUN: 55 mg/dL — ABNORMAL HIGH (ref 6–20)
CHLORIDE: 119 mmol/L — AB (ref 101–111)
CO2: 22 mmol/L (ref 22–32)
Calcium: 8.7 mg/dL — ABNORMAL LOW (ref 8.9–10.3)
Creatinine, Ser: 1.24 mg/dL — ABNORMAL HIGH (ref 0.44–1.00)
GFR calc non Af Amer: 46 mL/min — ABNORMAL LOW (ref >60)
GFR, EST AFRICAN AMERICAN: 54 mL/min — AB (ref >60)
Glucose, Bld: 185 mg/dL — ABNORMAL HIGH (ref 65–99)
POTASSIUM: 3.4 mmol/L — AB (ref 3.5–5.1)
SODIUM: 151 mmol/L — AB (ref 135–145)

## 2015-10-07 LAB — GLUCOSE, CAPILLARY
GLUCOSE-CAPILLARY: 164 mg/dL — AB (ref 65–99)
GLUCOSE-CAPILLARY: 204 mg/dL — AB (ref 65–99)
Glucose-Capillary: 142 mg/dL — ABNORMAL HIGH (ref 65–99)
Glucose-Capillary: 179 mg/dL — ABNORMAL HIGH (ref 65–99)
Glucose-Capillary: 212 mg/dL — ABNORMAL HIGH (ref 65–99)
Glucose-Capillary: 246 mg/dL — ABNORMAL HIGH (ref 65–99)

## 2015-10-07 LAB — CBC
HEMATOCRIT: 25.9 % — AB (ref 36.0–46.0)
Hemoglobin: 7.9 g/dL — ABNORMAL LOW (ref 12.0–15.0)
MCH: 29.3 pg (ref 26.0–34.0)
MCHC: 30.5 g/dL (ref 30.0–36.0)
MCV: 95.9 fL (ref 78.0–100.0)
Platelets: 344 10*3/uL (ref 150–400)
RBC: 2.7 MIL/uL — ABNORMAL LOW (ref 3.87–5.11)
RDW: 15 % (ref 11.5–15.5)
WBC: 16.2 10*3/uL — ABNORMAL HIGH (ref 4.0–10.5)

## 2015-10-07 LAB — PROCALCITONIN: Procalcitonin: 0.33 ng/mL

## 2015-10-07 LAB — VANCOMYCIN, TROUGH: Vancomycin Tr: 13 ug/mL (ref 10.0–20.0)

## 2015-10-07 MED ORDER — RISPERIDONE 1 MG/ML PO SOLN
1.0000 mg | Freq: Two times a day (BID) | ORAL | Status: DC
  Administered 2015-10-07 – 2015-10-08 (×2): 1 mg via ORAL
  Filled 2015-10-07 (×4): qty 1

## 2015-10-07 MED ORDER — LORAZEPAM 2 MG/ML IJ SOLN
1.0000 mg | Freq: Three times a day (TID) | INTRAMUSCULAR | Status: DC
  Administered 2015-10-07 – 2015-10-09 (×6): 1 mg via INTRAVENOUS
  Filled 2015-10-07 (×6): qty 1

## 2015-10-07 MED ORDER — MIDAZOLAM HCL 2 MG/2ML IJ SOLN: 2.0000 mg | INTRAMUSCULAR | Status: DC | PRN

## 2015-10-07 MED ORDER — FREE WATER
300.0000 mL | Freq: Four times a day (QID) | Status: DC
  Administered 2015-10-07 – 2015-10-08 (×2): 300 mL

## 2015-10-07 MED ORDER — ATORVASTATIN CALCIUM 10 MG PO TABS
10.0000 mg | ORAL_TABLET | Freq: Every day | ORAL | Status: DC
  Administered 2015-10-07 – 2015-10-22 (×14): 10 mg via ORAL
  Filled 2015-10-07 (×15): qty 1

## 2015-10-07 NOTE — Progress Notes (Signed)
PULMONARY / CRITICAL CARE MEDICINE   Name: RION SCHNITZER MRN: [id number] DOB: 08/31/1955    ADMISSION DATE:  09/28/2015 CONSULTATION DATE:  2/2  REFERRING MD:  Neuro  CHIEF COMPLAINT:  Obtunded   SUBJECTIVE:  Tolerates some pressure support.  Still having fever, and increased respiratory secretions.  VITAL SIGNS: BP 108/55 mmHg  Pulse 79  Temp(Src) 100.6 F (38.1 C) (Axillary)  Resp 22  Ht 5\' 7"  (1.702 m)  Wt 236 lb 12.4 oz (107.4 kg)  BMI 37.08 kg/m2  SpO2 100%  VENTILATOR SETTINGS: Vent Mode:  [-] PRVC FiO2 (%):  [40 %] 40 % Set Rate:  [20 bmp] 20 bmp Vt Set:  [490 mL] 490 mL PEEP:  [5 cmH20] 5 cmH20 Plateau Pressure:  [15 cmH20-25 cmH20] 15 cmH20  INTAKE / OUTPUT: I/O last 3 completed shifts: In: 1949.9 [I.V.:724.9; NG/GT:625; IV Piggyback:600] Out: -   PHYSICAL EXAMINATION: General: on vent, sedated Neuro: opens eyes with stimulation, becomes agitated at times. Rt side flaccid HEENT: trach site clean Cardiac: regular Chest: no wheeze Abd: g tube site clean Ext: 1+ edema Skin: no rashes  LABS:  BMET  Recent Labs Lab 10/04/15 0400 10/05/15 0550 10/06/15 1340  NA 147* 149* 152*  K 4.1 4.2 3.9  CL 115* 115* 119*  CO2 22 22 23   BUN 69* 72* 62*  CREATININE 1.68* 1.65* 1.55*  GLUCOSE 248* 215* 295*   Electrolytes  Recent Labs Lab 10/03/15 1030 10/04/15 0400 10/05/15 0550 10/06/15 1340  CALCIUM 8.5* 8.5* 8.4* 8.6*  MG 2.1 2.2 2.3  --   PHOS 3.9 4.9* 4.6  --    CBC  Recent Labs Lab 10/04/15 0400 10/05/15 0325 10/07/15 0430  WBC 12.2* 18.9* 16.2*  HGB 8.9* 10.2* 7.9*  HCT 29.1* 32.6* 25.9*  PLT 278 PLATELET CLUMPS NOTED ON SMEAR, UNABLE TO ESTIMATE 344   Coag's  Recent Labs Lab 10/04/15 0400  APTT 29  INR 1.16   Sepsis Markers  Recent Labs Lab 10/06/15 1223 10/07/15 0430  PROCALCITON 0.37 0.33   ABG  Recent Labs Lab 10/03/15 0450 10/04/15 0416 10/05/15 0424  PHART 7.409 7.412 7.422  PCO2ART 32.4* 32.7* 29.9*   PO2ART 60.3* 63.2* 101*   Glucose  Recent Labs Lab 10/06/15 1204 10/06/15 1602 10/06/15 1958 10/06/15 2353 10/07/15 0351 10/07/15 0756  GLUCAP 244* 286* 278* 246* 212* 142*   Imaging Dg Chest Port 1 View  10/07/2015  CLINICAL DATA:  Respiratory failure EXAM: PORTABLE CHEST 1 VIEW COMPARISON:  Yesterday FINDINGS: Right IJ central line with tip at the upper cavoatrial junction. Tracheostomy tube is well seated. No cardiomegaly for technique. Stable upper mediastinal widening, accentuated by rotation. Improved inflation and decreased reticulation left lung. No edema, effusion, or pneumothorax. Probable calcified mediastinal and hilar lymph nodes. IMPRESSION: Improved left-sided atelectasis or pneumonia. Electronically Signed   By: Marnee Spring M.D.   On: 10/07/2015 08:31   STUDIES:  EEG 2/02 >> consistent with a sleep/sedation EEG with a possible suggestion of a left central cerebral dysfunction Echo 2/03 >> EF 55 to 60%, grade 1 diastolic dysfx Doppler legs b/l 2/04 >> no DVT  CULTURES: 2/2 Blood >> negative 2/2 Urine >> negative 2/2 Sputum >> oral flora 2/4 Sputum >> MRSA 2/4 Blood >> Coag neg Staph 2/9 Blood >>GPC>> 2/10 Sputum >>  ANTIBIOTICS: 2/7 Vancomycin >>  2/10 Fortaz >>   SIGNIFICANT EVENTS: 2/2 Admit, neuro IR >> revascularization of Lt MCA inferior division 2/7 Trach, G tube  LINES/TUBES: 2/02 ETT >>  2/07 2/07 Trach [place]) >>  2/07 Gtube (CCS) >> 2/10 Rt IJ CVL >>  DISCUSSION: 60 yo female with Lt MCA CVA, VDRF.  UDS positive for cocaine.  Failed to wean from vent due to mental status, and required tracheostomy.  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  ASSESSMENT / PLAN:  NEUROLOGY A: Acute encephalopathy 2nd to Lt MCA CVA and cocaine. P: Wean off precedex to keep RASS 0 Continue ASA  PULMONARY A: Compromised airway in setting of CVA. Failure to wean from vent s/p tracheostomy 2/07. P:    Pressure support wean to trach collar as tolerated F/u CXR  CARDIOVASCULAR A:  Hx of HTN, exacerbated by cocaine use.  P:  Goal SBP < 160   RENAL  Recent Labs Lab 10/04/15 0400 10/05/15 0550 10/06/15 1340  NA 147* 149* 152*   Lab Results  Component Value Date   CREATININE 1.55* 10/06/2015   CREATININE 1.65* 10/05/2015   CREATININE 1.68* 10/04/2015   CREATININE 1.27 02/28/2014   CREATININE 1.15 04/10/2012   CREATININE 1.32* 11/13/2011     A:   CKD stage 2 >> baseline renal fx 1.27 from 02/28/14. Hypernatremia. P:   Monitor renal fx, urine outpt In.smcrcrease free water, f/u BMET Treat glucose more aggressively   GASTROINTESTINAL A:   Dysphagia s/p G tube 2/07. P:   Tube feeds Protonix for SUP  HEMATOLOGIC A:   Anemia of critical illness. P:  F/u CBC intermittently SQ heparin, SCD's for DVT prevention  INFECTIOUS A:   Fever with HCAP from MRSA, and Coag neg Staph in Blood cx from 2/04. P:   Day 5 of vancomycin Repeated blood cx 2/09>>GPC>> Repeat sputum cx 2/10>> procalcitonin 0.33 2/11 Add fortaz 2/10>>  ENDOCRINE CBG (last 3)   Recent Labs  10/06/15 2353 10/07/15 0351 10/07/15 0756  GLUCAP 246* 212* 142*     A:   DM type II. P:   SSI Added lantus 2/09  Disposition >> will likely need LTAC, depending on her vent requirements.  Brett Canales Minor ACNP [facility] P[facility] Pager [phone] till 3 pm If no answer page [phone] 10/07/2015, 8:40 AM  STAFF NOTE: I, Rory Percy, MD FACP have personally reviewed patient's available data, including medical history, events of note, physical examination and test results as part of my evaluation. I have discussed with resident/NP and other care providers such as pharmacist, RN and RRT. In addition, I personally evaluated patient and elicited key findings of: awakens, on precedex still, vent dependent remains, PS weaning to 10-15 if needed, goal is to wean precedex, add ativan, prn versed, limit haldol  etc with seizure risk, likely need oral Risperdal as well,  PEg in place clean, ltach consult, likely would tolerate still Na 150 range, may need to lower free water  The patient is critically ill with multiple organ systems failure and requires high complexity decision making for assessment and support, frequent evaluation and titration of therapies, application of advanced monitoring technologies and extensive interpretation of multiple databases.   Critical Care Time devoted to patient care services described in this note is 30 Minutes. This time reflects time of care of this signee: Rory Percy, MD FACP. This critical care time does not reflect procedure time, or teaching time or supervisory time of PA/NP/Med student/Med Resident etc but could involve care discussion time. Rest per NP/medical resident whose note is outlined above and that I agree with   Mcarthur Rossetti.  Tyson Alias, MD, FACP Pgr: [phone] [facility] 10/07/2015 2:51 PM

## 2015-10-07 NOTE — Progress Notes (Signed)
STROKE TEAM PROGRESS NOTE   SUBJECTIVE (INTERVAL HISTORY) No family  Is at the bedside. She had tracheostomy and PEG tube 10/04/15. She still on ventilatory support, not able to wean off. Had central line yesterday, blood culture 10/05/15 showed G+ in clusters. She is on ceftazedime and vanco. CCM following.    OBJECTIVE Temp:  [98.4 F (36.9 C)-101.6 F (38.7 C)] 100.6 F (38.1 C) (02/11 0758) Pulse Rate:  [65-111] 79 (02/11 0700) Cardiac Rhythm:  [-] Normal sinus rhythm (02/10 2000) Resp:  [0-35] 22 (02/11 0700) BP: (82-145)/(47-86) 108/55 mmHg (02/11 0700) SpO2:  [93 %-100 %] 100 % (02/11 0700) FiO2 (%):  [40 %] 40 % (02/11 0415) Weight:  [236 lb 12.4 oz (107.4 kg)] 236 lb 12.4 oz (107.4 kg) (02/11 0432)  CBC:   Recent Labs Lab 10/05/15 0325 10/07/15 0430  WBC 18.9* 16.2*  HGB 10.2* 7.9*  HCT 32.6* 25.9*  MCV 94.5 95.9  PLT PLATELET CLUMPS NOTED ON SMEAR, UNABLE TO ESTIMATE 344    Basic Metabolic Panel:   Recent Labs Lab 10/04/15 0400 10/05/15 0550 10/06/15 1340  NA 147* 149* 152*  K 4.1 4.2 3.9  CL 115* 115* 119*  CO2 GLUCOSE 248* 215* 295*  BUN 69* 72* 62*  CREATININE 1.68* 1.65* 1.55*  CALCIUM 8.5* 8.4* 8.6*  MG 2.2 2.3  --   PHOS 4.9* 4.6  --     Lipid Panel:     Component Value Date/Time   CHOL 170 09/28/2015 0938   TRIG 245* 10/04/2015 0841   HDL 38* 09/28/2015 0938   CHOLHDL 4.5 09/28/2015 0938   VLDL UNABLE TO CALCULATE IF TRIGLYCERIDE OVER 400 mg/dL 16/05/9603 5409   LDLCALC UNABLE TO CALCULATE IF TRIGLYCERIDE OVER 400 mg/dL 81/19/1478 2956   OZHY8M:  Lab Results  Component Value Date   HGBA1C 6.9* 09/28/2015   Urine Drug Screen:     Component Value Date/Time   LABOPIA NONE DETECTED 09/28/2015 0226   LABBENZ NONE DETECTED 09/28/2015 0226   AMPHETMU NONE DETECTED 09/28/2015 0226   THCU NONE DETECTED 09/28/2015 0226   LABBARB NONE DETECTED 09/28/2015 0226      IMAGING I have personally reviewed the radiological images below  and agree with the radiology interpretations.  Ct Head Wo Contrast 09/28/2015  1. New calcific density in the left sylvian fissure, possible calcific distal M2 embolus. No visible acute infarct or hemorrhage. 2. Advanced chronic small vessel disease with multiple lacunar infarcts.   CTA NECK 09/28/2015   Moderately motion degraded examination, limited by LEFT venous contamination. No definite acute vascular process or hemodynamically significant stenosis. 3.5 x 5.7 cm dominant LEFT thyroid not oral for which follow up thyroid sonogram is recommended on a nonemergent basis.   CTA HEAD 09/28/2015   LEFT M2 segment occlusion corresponding to sylvian fissure density on today's CT, likely acute. Complete circle of Willis.  Mild intracranial atherosclerosis. Moderate calcific atherosclerosis the carotid siphons.   Cerebral Angiogram 09/28/2015 S/P Lt internal carotid arteriogram followed by complete revascularization of occluded dominant inferior division of LT MCA with x pass with trevoprovue 4 mmx 30 mm retrieval device and 6 mg of superselective Integrelin achieving a TICI 3 flow.  MRI  Large acute hemorrhagic left hemispheric infarct involves the majority of the left middle cerebral artery distribution. Hemorrhage is most notable involving the left posterior temporal -parietal aspect of this acute infarct. Local mass effect upon the left lateral ventricle. At most there is minimal bowing of the septum  to the right. Several small acute nonhemorrhagic right hemispheric infarcts involving right caudate head, superior aspect of the post limb right internal capsule, right frontal lobe, superior right lenticular nucleus and right occipital lobe. Several small acute nonhemorrhagic cerebellar infarcts greater on the right. Remote left lenticular nucleus infarct or hematoma with blood-stained cleft now noted. Moderate to marked chronic small vessel disease changes. Atrophy.  EEG Abnormalities: 1)  possible attenuation of sleep structures in the left central region 2) lack of waking state Clinical Interpretation: This EEG is consistent with a sleep/sedation EEG with a possible suggestion of a left central cerebral dysfunction, though I don't think this is definite by this study.  2D echo - Systolic function was normal. The estimated ejection fraction was in the range of 55% to 60%. Wall motion was normal; there were no regional wall motion abnormalities. Doppler parameters are consistent with abnormal left ventricular relaxation (grade 1 diastolic dysfunction).  LE venous doppler -   no DVT    Physical exam  Temp:  [98.4 F (36.9 C)-101.6 F (38.7 C)] 100.6 F (38.1 C) (02/11 0758) Pulse Rate:  [65-111] 79 (02/11 0700) Resp:  [0-35] 22 (02/11 0700) BP: (82-145)/(47-86) 108/55 mmHg (02/11 0700) SpO2:  [93 %-100 %] 100 % (02/11 0700) FiO2 (%):  [40 %] 40 % (02/11 0415) Weight:  [236 lb 12.4 oz (107.4 kg)] 236 lb 12.4 oz (107.4 kg) (02/11 0432)  General - Well nourished, well developed, s/p trach and PEG, on precedex.  Ophthalmologic - Fundi not visualized due to noncooperation.  Cardiovascular - Regular rate and rhythm.  Neruo - eyes open, but nonverbal, lethargic, s/p trach and peg, on ventilation. Not following commands. Eyes left gaze preference, barely cross midline. Not blinking to visual threat bilaterally, likely right neglect. PERRL. Right facial droop. tongue in middle inside mouth. Spontaneous movement LUE and LLE, on pain stimulation LUE against gravity but LLE not against gravity. RUE and RLE hemiplegia, with increased tone. DTR 1+, no babinski. Sensation, gait and coordination not tested.   ASSESSMENT/PLAN Linda [name] is a 60 y.o. female with history of left BG ICH in 2012, hypertension, cocaine abuse, renal failure and hepatitis C presenting with altered mental status, right side weakness and leftward gaze. Also concerning for possible seizure  activity initial presentation. She did not receive IV t-PA due to hx ICH, CTA showed a L M2 occlusion, she was sent to IR where she received complete TICI3 revascularization of the L MCA with mechanical thrombectomy with trevo and IA Integrilin.  Stroke:  left MCA infarct with hemorrhagic transformation s/p revascularizationf of L M2, multifocal scattered infarcts involving bilateral posterior and anterior circulation, consistent with cardioembolic source although pt does have significant large vessel intracranial atherosclerosis as well as cocaine abuse.  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  CTA neck L thyroid nodule  2D Echo  EF 55-60%  LE venous doppler negative for DVT  LDL unable to calculate due to high TG   HgbA1c 6.9  UDS positive for cocaine  Heparin subq for VTE prophylaxis Diet NPO time specified  No antithrombotic prior to admission, now on ASA 325mg .   Ongoing aggressive stroke risk  factor management  Therapy recommendations:  pending   Disposition:  pending   ? Seizure  Questionable seizure presentation initially as reported in chart  EEG showed no seizure but under sedation  Hold off AEDs at this time  No seizure activity since admission  on precedex now  Hx of ICH  2012 left BG ICH; during hospital course:  BP high at 200s  Urine positive with cocaine   S/p trach and PEG at that time  Lost follow up since  Acute respiratory failure  Intubated in ED due to airway protection  S/p trach  Not able to wean off ventilation  CCM following  Bacteremia   Positive blood Cx 10/04/14 G+  in cluster 2/2  Leukocytosis WBC 16.2  On ceftazidine and vanco  Central line placed  CCM on board  Hypernatremia  Na 152  On free water  Hypertensive Emergency  BP as high as 202/124  off cardene drip  Stable   Hyperlipidemia  Home meds:  No statin  LDL not able to calculate due to high TG , goal < 70  Total chol 170  Add lipitor   Dysphagia   Hx Dysphagia secondary to ICH in 2012 with PEG  S/p PEG  On TF  Cocaine abuse  urine positive both in 2012 and this admission  Likely one of the causes of intracranial vascular stenosis  Need cessation counseling   Other Stroke Risk Factors  Obesity  Other Active Problems  Hepatitis C   Hospital day # 9  This patient is critically ill due to large left MCA infarct with left M2 occlusion s/p intervention, hemorrhagic transformation, bacteremia and at significant risk of neurological worsening, death form recurrent infarcts, increased ICH, brain herniation, cerebral edema, heart failure and sepsis. This patient's care requires constant monitoring of vital signs, hemodynamics, respiratory and cardiac monitoring, review of multiple databases, neurological assessment, discussion with family, other specialists and medical decision making of high complexity. I spent 40 minutes of neurocritical care time in the care of this patient.  Marvel Plan, MD PhD Stroke Neurology 10/07/2015 8:49 AM     To contact Stroke Continuity provider, please refer to [email or web address]. After hours, contact General Neurology

## 2015-10-08 ENCOUNTER — Inpatient Hospital Stay (HOSPITAL_COMMUNITY): Payer: Medicaid Other

## 2015-10-08 DIAGNOSIS — G936 Cerebral edema: Secondary | ICD-10-CM

## 2015-10-08 LAB — CULTURE, BLOOD (ROUTINE X 2)

## 2015-10-08 LAB — CULTURE, RESPIRATORY W GRAM STAIN

## 2015-10-08 LAB — BASIC METABOLIC PANEL
Anion gap: 9 (ref 5–15)
BUN: 45 mg/dL — ABNORMAL HIGH (ref 6–20)
CHLORIDE: 120 mmol/L — AB (ref 101–111)
CO2: 22 mmol/L (ref 22–32)
CREATININE: 1.18 mg/dL — AB (ref 0.44–1.00)
Calcium: 8.4 mg/dL — ABNORMAL LOW (ref 8.9–10.3)
GFR calc non Af Amer: 49 mL/min — ABNORMAL LOW (ref >60)
GFR, EST AFRICAN AMERICAN: 57 mL/min — AB (ref >60)
GLUCOSE: 229 mg/dL — AB (ref 65–99)
Potassium: 3.4 mmol/L — ABNORMAL LOW (ref 3.5–5.1)
Sodium: 151 mmol/L — ABNORMAL HIGH (ref 135–145)

## 2015-10-08 LAB — PROCALCITONIN: Procalcitonin: 0.25 ng/mL

## 2015-10-08 LAB — GLUCOSE, CAPILLARY
GLUCOSE-CAPILLARY: 165 mg/dL — AB (ref 65–99)
GLUCOSE-CAPILLARY: 198 mg/dL — AB (ref 65–99)
GLUCOSE-CAPILLARY: 202 mg/dL — AB (ref 65–99)
GLUCOSE-CAPILLARY: 228 mg/dL — AB (ref 65–99)
GLUCOSE-CAPILLARY: 251 mg/dL — AB (ref 65–99)
Glucose-Capillary: 244 mg/dL — ABNORMAL HIGH (ref 65–99)
Glucose-Capillary: 240 mg/dL — ABNORMAL HIGH (ref 65–99)

## 2015-10-08 LAB — CULTURE, RESPIRATORY

## 2015-10-08 LAB — CBC
HEMATOCRIT: 27.3 % — AB (ref 36.0–46.0)
HEMOGLOBIN: 8.3 g/dL — AB (ref 12.0–15.0)
MCH: 29.2 pg (ref 26.0–34.0)
MCHC: 30.4 g/dL (ref 30.0–36.0)
MCV: 96.1 fL (ref 78.0–100.0)
Platelets: 376 10*3/uL (ref 150–400)
RBC: 2.84 MIL/uL — ABNORMAL LOW (ref 3.87–5.11)
RDW: 15 % (ref 11.5–15.5)
WBC: 16.8 10*3/uL — ABNORMAL HIGH (ref 4.0–10.5)

## 2015-10-08 LAB — MAGNESIUM: MAGNESIUM: 2.3 mg/dL (ref 1.7–2.4)

## 2015-10-08 LAB — PHOSPHORUS: Phosphorus: 3.8 mg/dL (ref 2.5–4.6)

## 2015-10-08 MED ORDER — INSULIN GLARGINE 100 UNIT/ML ~~LOC~~ SOLN
20.0000 [IU] | Freq: Every day | SUBCUTANEOUS | Status: DC
  Administered 2015-10-08: 20 [IU] via SUBCUTANEOUS
  Filled 2015-10-08 (×3): qty 0.2

## 2015-10-08 MED ORDER — LORAZEPAM 2 MG/ML IJ SOLN
1.0000 mg | INTRAMUSCULAR | Status: DC | PRN
  Administered 2015-10-08: 2 mg via INTRAVENOUS
  Administered 2015-10-10: 1 mg via INTRAVENOUS
  Administered 2015-10-10: 2 mg via INTRAVENOUS
  Administered 2015-10-10: 1 mg via INTRAVENOUS
  Administered 2015-10-11 – 2015-10-12 (×4): 2 mg via INTRAVENOUS
  Administered 2015-10-12 – 2015-10-13 (×3): 1 mg via INTRAVENOUS
  Administered 2015-10-14 – 2015-10-18 (×4): 2 mg via INTRAVENOUS
  Filled 2015-10-08 (×16): qty 1

## 2015-10-08 MED ORDER — CLONIDINE HCL 0.1 MG/24HR TD PTWK
0.1000 mg | MEDICATED_PATCH | TRANSDERMAL | Status: DC
  Administered 2015-10-08 – 2015-10-22 (×3): 0.1 mg via TRANSDERMAL
  Filled 2015-10-08 (×3): qty 1

## 2015-10-08 MED ORDER — RISPERIDONE 1 MG/ML PO SOLN
2.0000 mg | Freq: Two times a day (BID) | ORAL | Status: DC
Start: 2015-10-08 — End: 2015-10-25
  Administered 2015-10-08 – 2015-10-25 (×30): 2 mg via ORAL
  Filled 2015-10-08 (×36): qty 2

## 2015-10-08 MED ORDER — POTASSIUM CHLORIDE 20 MEQ/15ML (10%) PO SOLN
40.0000 meq | Freq: Once | ORAL | Status: AC
  Administered 2015-10-08: 40 meq
  Filled 2015-10-08: qty 30

## 2015-10-08 MED ORDER — FENTANYL CITRATE (PF) 100 MCG/2ML IJ SOLN
25.0000 ug | INTRAMUSCULAR | Status: DC | PRN
Start: 2015-10-08 — End: 2015-10-25
  Administered 2015-10-08 – 2015-10-17 (×24): 100 ug via INTRAVENOUS
  Administered 2015-10-17: 50 ug via INTRAVENOUS
  Administered 2015-10-18 – 2015-10-23 (×15): 100 ug via INTRAVENOUS
  Filled 2015-10-08 (×41): qty 2

## 2015-10-08 MED ORDER — VANCOMYCIN HCL IN DEXTROSE 750-5 MG/150ML-% IV SOLN
750.0000 mg | Freq: Two times a day (BID) | INTRAVENOUS | Status: DC
  Administered 2015-10-08 – 2015-10-10 (×4): 750 mg via INTRAVENOUS
  Filled 2015-10-08 (×6): qty 150

## 2015-10-08 MED ORDER — FREE WATER
300.0000 mL | Status: DC
  Administered 2015-10-08 – 2015-10-19 (×55): 300 mL

## 2015-10-08 NOTE — Progress Notes (Signed)
Pharmacy Antibiotic Note  Linda [name] is a 60 y.o. female on vancomycin for CoNS bacteremia and MRSA pneumonia. VT = 13  Plan: Vancomycin 750 mg q12h F/U trough at SS  Height:  (170.2 cm) Weight: 236 lb 1.8 oz (107.1 kg) IBW/kg (Calculated) : 61.6  Temp (24hrs), Avg:99.7 F (37.6 C), Min:98.6 F (37 C), Max:100.6 F (38.1 C)   Recent Labs Lab 10/03/15 1030 10/04/15 0400 10/05/15 0325 10/05/15 0550 10/06/15 1340 10/07/15 0430 10/07/15 0855 10/07/15 1728 10/08/15 0400  WBC 10.1 12.2* 18.9*  --   --  16.2*  --   --  16.8*  CREATININE 1.48* 1.68*  --  1.65* 1.55*  --  1.24*  --  1.18*  VANCOTROUGH  --   --   --   --   --   --   --  13  --     Estimated Creatinine Clearance: 63.9 mL/min (by C-G formula based on Cr of 1.18).    No Known Allergies  Antimicrobials this admission: Vancomycin 2/7>> Ceftazidime 2/10>>2/12  Dose adjustments this admission: 2/11: VT = 13 on 1250 mg q24h  Microbiology results:  2/9 Blood - NGTD 2/4 Blood 2/2 CoNS 2/4 TA - MRSA 2/2 Blood -NEG 2/2 TA - NEG 2/2 Urine - NEG 2/2 MRSA - NEG  Isaac Bliss, PharmD, BCPS, [facility] Clinical Pharmacist Pager [phone] 10/08/2015 12:16 PM

## 2015-10-08 NOTE — Progress Notes (Signed)
PULMONARY / CRITICAL CARE MEDICINE   Name: Linda [name] MRN: [id number] DOB: [birth year]    ADMISSION DATE:  09/28/2015 CONSULTATION DATE:  2/2  REFERRING MD:  Neuro  CHIEF COMPLAINT:  Obtunded   SUBJECTIVE:  Tolerates some pressure support.  Still having fever, and increased respiratory secretions.  VITAL SIGNS: BP 141/43 mmHg  Pulse 122  Temp(Src) 99.7 F (37.6 C) (Axillary)  Resp 33  Ht  (1.702 m)  Wt 236 lb 1.8 oz (107.1 kg)  BMI 36.97 kg/m2  SpO2 99%  VENTILATOR SETTINGS: Vent Mode:  [-] PRVC FiO2 (%):  [40 %] 40 % Set Rate:  [20 bmp] 20 bmp Vt Set:  [490 mL] 490 mL PEEP:  [5 cmH20] 5 cmH20 Plateau Pressure:  [11 cmH20-20 cmH20] 20 cmH20  INTAKE / OUTPUT: I/O last 3 completed shifts: In: 1951.4 [I.V.:566.4; NG/GT:1035; IV Piggyback:350] Out: -   PHYSICAL EXAMINATION: General: on vent, sedated or agitated  Neuro: opens eyes with stimulation, becomes agitated at times. Rt side flaccid HEENT: trach site clean Cardiac: regular Chest: no wheeze Abd: g tube site clean Ext: 1+ edema Skin: no rashes  LABS:  BMET  Recent Labs Lab 10/06/15 1340 10/07/15 0855 10/08/15 0400  NA 152* 151* 151*  K 3.9 3.4* 3.4*  CL 119* 119* 120*  CO2 BUN 62* 55* 45*  CREATININE 1.55* 1.24* 1.18*  GLUCOSE 295* 185* 229*   Electrolytes  Recent Labs Lab 10/04/15 0400 10/05/15 0550 10/06/15 1340 10/07/15 0855 10/08/15 0400  CALCIUM 8.5* 8.4* 8.6* 8.7* 8.4*  MG 2.2 2.3  --   --  2.3  PHOS 4.9* 4.6  --   --  3.8   CBC  Recent Labs Lab 10/05/15 0325 10/07/15 0430 10/08/15 0400  WBC 18.9* 16.2* 16.8*  HGB 10.2* 7.9* 8.3*  HCT 32.6* 25.9* 27.3*  PLT PLATELET CLUMPS NOTED ON SMEAR, UNABLE TO ESTIMATE 344 376   Coag's  Recent Labs Lab 10/04/15 0400  APTT 29  INR 1.16   Sepsis Markers  Recent Labs Lab 10/06/15 1223 10/07/15 0430 10/08/15 0400  PROCALCITON 0.37 0.33 0.25   ABG  Recent Labs Lab 10/03/15 0450 10/04/15 0416  10/05/15 0424  PHART 7.409 7.412 7.422  PCO2ART 32.4* 32.7* 29.9*  PO2ART 60.3* 63.2* 101*   Glucose  Recent Labs Lab 10/07/15 1145 10/07/15 1622 10/07/15 1935 10/07/15 2346 10/08/15 0344 10/08/15 0749  GLUCAP 164* 204* 179* 165* 198* 202*   Imaging Dg Chest Port 1 View  10/08/2015  CLINICAL DATA:  Respiratory failure EXAM: PORTABLE CHEST 1 VIEW COMPARISON:  October 07, 2015 FINDINGS: Tracheostomy catheter tip is 5.4 cm above the carina. Central catheter tip is at the cavoatrial junction. No pneumothorax. There is mild atelectasis in the left mid lung. The lungs elsewhere are clear. Heart size and pulmonary vascularity are normal. No adenopathy. There are calcified right paratracheal lymph nodes. No demonstrable bone lesions. IMPRESSION: Atelectasis left mid lung. Lungs elsewhere clear. No change in cardiac silhouette. Tube and catheter positions as described without pneumothorax. Electronically Signed   By: Bretta Bang III M.D.   On: 10/08/2015 07:51   STUDIES:  EEG 2/02 >> consistent with a sleep/sedation EEG with a possible suggestion of a left central cerebral dysfunction Echo 2/03 >> EF 55 to 60%, grade 1 diastolic dysfx Doppler legs b/l 2/04 >> no DVT  CULTURES: 2/2 Blood >> negative 2/2 Urine >> negative 2/2 Sputum >> oral flora 2/4 Sputum >> MRSA 2/4 Blood >> Coag neg  Staph 2/9 Blood >>GPC>> 2/10 Sputum >>MRSA  ANTIBIOTICS: 2/7 Vancomycin >>  2/10 Fortaz >> 2/12  SIGNIFICANT EVENTS: 2/2 Admit, neuro IR >> revascularization of Lt MCA inferior division 2/7 Trach, G tube  LINES/TUBES: 2/02 ETT >> 2/07 2/07 Trach (JY) >>  2/07 Gtube (CCS) >> 2/10 Rt IJ CVL >>  DISCUSSION: 60 yo female with Lt MCA CVA, VDRF.  UDS positive for cocaine.  Failed to wean from vent due to mental status, and required tracheostomy.  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  ASSESSMENT / PLAN:  NEUROLOGY A: Acute  encephalopathy 2nd to Lt MCA CVA and cocaine. P: Wean off precedex to keep RASS 0 Continue ASA 2/12 add clonidne for agitation and substance abuse. May need to bump up risperdal.  PULMONARY A: Compromised airway in setting of CVA. Failure to wean from vent s/p tracheostomy 2/07. P:   Pressure support wean to trach collar as tolerated F/u CXR  CARDIOVASCULAR A:  Hx of HTN, exacerbated by cocaine use.  P:  Goal SBP < 160   RENAL  Recent Labs Lab 10/06/15 1340 10/07/15 0855 10/08/15 0400  NA 152* 151* 151*   Lab Results  Component Value Date   CREATININE 1.18* 10/08/2015   CREATININE 1.24* 10/07/2015   CREATININE 1.55* 10/06/2015   CREATININE 1.27 02/28/2014   CREATININE 1.15 04/10/2012   CREATININE 1.32* 11/13/2011     A:   CKD stage 2 >> baseline renal fx 1.27 from 02/28/14. Hypernatremia. P:   Monitor renal fx, urine outpt In.smcrcrease free water, f/u BMET Treat glucose more aggressively   GASTROINTESTINAL A:   Dysphagia s/p G tube 2/07. P:   Tube feeds Protonix for SUP  HEMATOLOGIC A:   Anemia of critical illness.  Recent Labs  10/07/15 0430 10/08/15 0400  HGB 7.9* 8.3*    P:  F/u CBC intermittently SQ heparin, SCD's for DVT prevention  INFECTIOUS A:   Fever with HCAP from MRSA, and Coag neg Staph in Blood cx from 2/04. P:   Day 6 of vancomycin Repeated blood cx 2/09>>GPC>>coag neg Repeat sputum cx 2/10>>GPC/GNR>> procalcitonin 0.33 2/11 Add fortaz 2/10>>dc  ENDOCRINE CBG (last 3)   Recent Labs  10/07/15 2346 10/08/15 0344 10/08/15 0749  GLUCAP 165* 198* 202*     A:   DM type II. P:   SSI Added lantus 2/09  Disposition >> will likely need LTAC, depending on her vent requirements.   Brett Canales Minor ACNP [facility] PCCM Pager [phone] till 3 pm If no answer page [phone] 10/08/2015, 10:35 AM   STAFF NOTE: I, Rory Percy, MD FACP have personally reviewed patient's available data, including medical history, events  of note, physical examination and test results as part of my evaluation. I have discussed with resident/NP and other care providers such as pharmacist, RN and RRT. In addition, I personally evaluated patient and elicited key findings of: off precedex with ativan , still with some aggitation, increase rispirdal to 2 mg, wean attempts PS 10-15, ltach consult, to sdu vent bed, consider further lsoe correction na  Mcarthur Rossetti. Tyson Alias, MD, FACP Pgr: [phone] [facility] 10/08/2015 11:46 AM

## 2015-10-08 NOTE — Progress Notes (Signed)
STROKE TEAM PROGRESS NOTE   SUBJECTIVE (INTERVAL HISTORY) No family  Is at the bedside. Yesterday, blood culture 10/05/15 showed G+ in clusters. Sputum culture showed G+ cocci in pairs. She is hepC positive. UDS cocaine positive. She is on ceftazedime and vanco. CCM following. Still high RR, not able to wean off vent.   OBJECTIVE Temp:  [98.6 F (37 C)-100.6 F (38.1 C)] 99.7 F (37.6 C) (02/12 0747) Pulse Rate:  [39-141] 122 (02/12 0835) Cardiac Rhythm:  [-] Normal sinus rhythm;Sinus tachycardia (02/12 0800) Resp:  [9-36] 33 (02/12 0835) BP: (90-156)/(43-79) 141/43 mmHg (02/12 0835) SpO2:  [99 %-100 %] 99 % (02/12 0835) FiO2 (%):  [40 %] 40 % (02/12 0835) Weight:  [236 lb 1.8 oz (107.1 kg)] 236 lb 1.8 oz (107.1 kg) (02/12 0403)  CBC:   Recent Labs Lab 10/07/15 0430 10/08/15 0400  WBC 16.2* 16.8*  HGB 7.9* 8.3*  HCT 25.9* 27.3*  MCV 95.9 96.1  PLT 344 376    Basic Metabolic Panel:   Recent Labs Lab 10/05/15 0550  10/07/15 0855 10/08/15 0400  NA 149*  < > 151* 151*  K 4.2  < > 3.4* 3.4*  CL 115*  < > 119* 120*  CO2 22  < > 22 22  GLUCOSE 215*  < > 185* 229*  BUN 72*  < > 55* 45*  CREATININE 1.65*  < > 1.24* 1.18*  CALCIUM 8.4*  < > 8.7* 8.4*  MG 2.3  --   --  2.3  PHOS 4.6  --   --  3.8  < > = values in this interval not displayed.  Lipid Panel:     Component Value Date/Time   CHOL 170 09/28/2015 0938   TRIG 245* 10/04/2015 0841   HDL 38* 09/28/2015 0938   CHOLHDL 4.5 09/28/2015 0938   VLDL UNABLE TO CALCULATE IF TRIGLYCERIDE OVER 400 mg/dL 16/05/9603 5409   LDLCALC UNABLE TO CALCULATE IF TRIGLYCERIDE OVER 400 mg/dL 81/19/1478 2956   OZHY8M:  Lab Results  Component Value Date   HGBA1C 6.9* 09/28/2015   Urine Drug Screen:     Component Value Date/Time   LABOPIA NONE DETECTED 09/28/2015 0226   LABBENZ NONE DETECTED 09/28/2015 0226   AMPHETMU NONE DETECTED 09/28/2015 0226   THCU NONE DETECTED 09/28/2015 0226   LABBARB NONE DETECTED 09/28/2015 0226       IMAGING I have personally reviewed the radiological images below and agree with the radiology interpretations.  Ct Head Wo Contrast 09/28/2015  1. New calcific density in the left sylvian fissure, possible calcific distal M2 embolus. No visible acute infarct or hemorrhage. 2. Advanced chronic small vessel disease with multiple lacunar infarcts.   CTA NECK 09/28/2015   Moderately motion degraded examination, limited by LEFT venous contamination. No definite acute vascular process or hemodynamically significant stenosis. 3.5 x 5.7 cm dominant LEFT thyroid not oral for which follow up thyroid sonogram is recommended on a nonemergent basis.   CTA HEAD 09/28/2015   LEFT M2 segment occlusion corresponding to sylvian fissure density on today's CT, likely acute. Complete circle of Willis.  Mild intracranial atherosclerosis. Moderate calcific atherosclerosis the carotid siphons.   Cerebral Angiogram 09/28/2015 S/P Lt internal carotid arteriogram followed by complete revascularization of occluded dominant inferior division of LT MCA with x pass with trevoprovue 4 mmx 30 mm retrieval device and 6 mg of superselective Integrelin achieving a TICI 3 flow.  MRI  Large acute hemorrhagic left hemispheric infarct involves the majority of the left middle  cerebral artery distribution. Hemorrhage is most notable involving the left posterior temporal -parietal aspect of this acute infarct. Local mass effect upon the left lateral ventricle. At most there is minimal bowing of the septum to the right. Several small acute nonhemorrhagic right hemispheric infarcts involving right caudate head, superior aspect of the post limb right internal capsule, right frontal lobe, superior right lenticular nucleus and right occipital lobe. Several small acute nonhemorrhagic cerebellar infarcts greater on the right. Remote left lenticular nucleus infarct or hematoma with blood-stained cleft now noted. Moderate to marked  chronic small vessel disease changes. Atrophy.  EEG Abnormalities: 1) possible attenuation of sleep structures in the left central region 2) lack of waking state Clinical Interpretation: This EEG is consistent with a sleep/sedation EEG with a possible suggestion of a left central cerebral dysfunction, though I don't think this is definite by this study.  2D echo - Systolic function was normal. The estimated ejection fraction was in the range of 55% to 60%. Wall motion was normal; there were no regional wall motion abnormalities. Doppler parameters are consistent with abnormal left ventricular relaxation (grade 1 diastolic dysfunction).  LE venous doppler -   no DVT   Dg Chest Port 1 View 10/08/2015  IMPRESSION: Atelectasis left mid lung. Lungs elsewhere clear. No change in cardiac silhouette. Tube and catheter positions as described without pneumothorax.    Physical exam  Temp:  [98.6 F (37 C)-100.6 F (38.1 C)] 99.7 F (37.6 C) (02/12 0747) Pulse Rate:  [39-141] 122 (02/12 0835) Resp:  [9-36] 33 (02/12 0835) BP: (90-156)/(43-79) 141/43 mmHg (02/12 0835) SpO2:  [99 %-100 %] 99 % (02/12 0835) FiO2 (%):  [40 %] 40 % (02/12 0835) Weight:  [236 lb 1.8 oz (107.1 kg)] 236 lb 1.8 oz (107.1 kg) (02/12 0403)  General - Well nourished, well developed, s/p trach and PEG, on precedex.  Ophthalmologic - Fundi not visualized due to noncooperation.  Cardiovascular - Regular rate and rhythm.  Neruo - eyes open, but nonverbal, lethargic, s/p trach and peg, on ventilation. Not following commands, but tracking objects with left gaze preference, doll's eye positive to both directions. Not blinking to visual threat bilaterally. PERRL. Right facial droop. tongue in middle inside mouth. Spontaneous movement LUE and LLE, on pain stimulation LUE against gravity but LLE not against gravity. RUE and RLE hemiplegia, with increased tone. DTR 1+, no babinski. Sensation, gait and coordination not  tested.   ASSESSMENT/[name] is a 60 y.o. female with history of left BG ICH in 2012, hypertension, cocaine abuse, renal failure and hepatitis C presenting with altered mental status, right side weakness and leftward gaze. Also concerning for possible seizure activity initial presentation. She did not receive IV t-PA due to hx ICH, CTA showed a L M2 occlusion, she was sent to IR where she received complete TICI3 revascularization of the L MCA with mechanical thrombectomy with trevo and IA Integrilin.  Stroke:  left MCA infarct with hemorrhagic transformation s/p revascularizationf of L M2, multifocal scattered infarcts involving bilateral posterior and anterior circulation, consistent with cardioembolic source although pt does have significant large vessel intracranial atherosclerosis as well as cocaine abuse.  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  CTA neck L thyroid nodule  2D Echo  EF 55-60%  LE venous doppler negative for DVT  LDL unable to calculate due to high TG   HgbA1c 6.9  UDS positive for cocaine  Heparin subq for VTE prophylaxis Diet NPO time specified  No antithrombotic prior to admission, now on ASA .   Ongoing aggressive stroke risk factor management  Therapy recommendations:  pending   Disposition:  pending   ? Seizure  Questionable seizure presentation initially as reported in chart  EEG showed no seizure but under sedation  Hold off AEDs at this time  No seizure activity since admission  Hx of ICH  2012 left BG ICH; during hospital course:  BP high at 200s  Urine  positive with cocaine   S/p trach and PEG at that time  Lost follow up since  Acute respiratory failure  Intubated in ED due to airway protection  S/p trach, still high RR  Not able to wean off ventilation  Off precedex, intermittent agitation  Sputum culture showed G+ in pairs 10/06/15  CCM following  Bacteremia   Positive blood Cx 10/05/15 G+ in cluster 2/2  Leukocytosis WBC 16.2  On ceftazidine and vanco  Central line placed  CCM on board  Hypernatremia  Na 152->151  On free water  Hypertensive Emergency  BP as high as 202/124  off cardene drip  Stable   Hyperlipidemia  Home meds:  No statin  LDL not able to calculate due to high TG , goal < 70  Total chol 170  Add lipitor   Dysphagia   Hx Dysphagia secondary to ICH in 2012 with PEG  S/p PEG  On TF  Cocaine abuse  urine positive both in 2012 and this admission  Likely one of the causes of intracranial vascular stenosis  Need cessation counseling   Other Stroke Risk Factors  Obesity  Other Active Problems  Hepatitis C   Hospital day # 10  This patient is critically ill due to large left MCA infarct with left M2 occlusion s/p intervention, hemorrhagic transformation, bacteremia and at significant risk of neurological worsening, death form recurrent infarcts, increased ICH, brain herniation, cerebral edema, heart failure and sepsis. This patient's care requires constant monitoring of vital signs, hemodynamics, respiratory and cardiac monitoring, review of multiple databases, neurological assessment, discussion with family, other specialists and medical decision making of high complexity. I spent 40 minutes of neurocritical care time in the care of this patient.  Linda Plan, MD PhD Stroke Neurology 10/08/2015 9:16 AM     To contact Stroke Continuity provider, please refer to [email or web address]. After hours, contact General Neurology

## 2015-10-08 NOTE — Progress Notes (Addendum)
10/08/2015 patient transfer from 60m to 2central at 1640. Patient unable verbalize needs.. She is on tube feeding, have a peg and abdominal binder. She is also on a  ventilator. Patient have  Breakdown under trach. Patient feet is dry. She is total care and unable to turn self. [facility] RN.

## 2015-10-08 NOTE — Progress Notes (Signed)
eLink Physician-Brief Progress Note Patient Name: Linda [name] DOB: [birth year] MRN: [id number]   Date of Service  10/08/2015  HPI/Events of Note  Nurse notified of ongoing sinus tachycardia despite normal blood pressure & patient seemingly sedate after IV Ativan. No improvement after IV fentanyl. No obvious grimacing or pain but still question adequate pain control.  eICU Interventions  Increase fentanyl IV pushes     Intervention Category Intermediate Interventions: Other:  [name] 10/08/2015, 5:10 AM

## 2015-10-09 ENCOUNTER — Inpatient Hospital Stay (HOSPITAL_COMMUNITY): Payer: Medicaid Other

## 2015-10-09 DIAGNOSIS — Z930 Tracheostomy status: Secondary | ICD-10-CM

## 2015-10-09 DIAGNOSIS — I639 Cerebral infarction, unspecified: Secondary | ICD-10-CM

## 2015-10-09 DIAGNOSIS — E1159 Type 2 diabetes mellitus with other circulatory complications: Secondary | ICD-10-CM

## 2015-10-09 LAB — BASIC METABOLIC PANEL
ANION GAP: 9 (ref 5–15)
BUN: 44 mg/dL — ABNORMAL HIGH (ref 6–20)
CALCIUM: 8.4 mg/dL — AB (ref 8.9–10.3)
CHLORIDE: 119 mmol/L — AB (ref 101–111)
CO2: 21 mmol/L — AB (ref 22–32)
Creatinine, Ser: 1.21 mg/dL — ABNORMAL HIGH (ref 0.44–1.00)
GFR calc non Af Amer: 48 mL/min — ABNORMAL LOW (ref >60)
GFR, EST AFRICAN AMERICAN: 55 mL/min — AB (ref >60)
Glucose, Bld: 297 mg/dL — ABNORMAL HIGH (ref 65–99)
POTASSIUM: 4 mmol/L (ref 3.5–5.1)
Sodium: 149 mmol/L — ABNORMAL HIGH (ref 135–145)

## 2015-10-09 LAB — CBC
HEMATOCRIT: 26.8 % — AB (ref 36.0–46.0)
HEMOGLOBIN: 8.3 g/dL — AB (ref 12.0–15.0)
MCH: 30.1 pg (ref 26.0–34.0)
MCHC: 31 g/dL (ref 30.0–36.0)
MCV: 97.1 fL (ref 78.0–100.0)
Platelets: ADEQUATE 10*3/uL (ref 150–400)
RBC: 2.76 MIL/uL — AB (ref 3.87–5.11)
RDW: 15.1 % (ref 11.5–15.5)
WBC: 14.8 10*3/uL — AB (ref 4.0–10.5)

## 2015-10-09 LAB — GLUCOSE, CAPILLARY
GLUCOSE-CAPILLARY: 245 mg/dL — AB (ref 65–99)
GLUCOSE-CAPILLARY: 275 mg/dL — AB (ref 65–99)
GLUCOSE-CAPILLARY: 275 mg/dL — AB (ref 65–99)
GLUCOSE-CAPILLARY: 256 mg/dL — AB (ref 65–99)
GLUCOSE-CAPILLARY: 246 mg/dL — AB (ref 65–99)
Glucose-Capillary: 277 mg/dL — ABNORMAL HIGH (ref 65–99)

## 2015-10-09 MED ORDER — LORAZEPAM 2 MG/ML IJ SOLN
1.0000 mg | Freq: Two times a day (BID) | INTRAMUSCULAR | Status: DC
Start: 2015-10-09 — End: 2015-10-10
  Administered 2015-10-10 (×2): 1 mg via INTRAVENOUS
  Filled 2015-10-09 (×2): qty 1

## 2015-10-09 MED ORDER — INSULIN GLARGINE 100 UNIT/ML ~~LOC~~ SOLN
35.0000 [IU] | Freq: Every day | SUBCUTANEOUS | Status: DC
  Administered 2015-10-09: 35 [IU] via SUBCUTANEOUS
  Filled 2015-10-09 (×2): qty 0.35

## 2015-10-09 NOTE — Progress Notes (Signed)
RT NOTE:  Dayshift RT stated she reported wound @ bottom left corner of trach to dayshift RN. When I arrive the trach site was crusty and red with a marble size wound. No dressing in place. Secretions around trach site. Trach site cleaned, no dressing applied to allow RN to view site. I will add Tegaderm dressing when this is done. RT made RN for night aware that patient may need wound consult. RT will follow.

## 2015-10-09 NOTE — Progress Notes (Signed)
STROKE TEAM PROGRESS NOTE   SUBJECTIVE (INTERVAL HISTORY) No family  Is at the bedside. Still have low grade fever and tachycardia. Glucose still high on high dose of SSI. Will need to increase lantus dose. Na 149 today. Pt neuro stable, no change from yesterday, still open eyes on stimulation, not following commands. Tracking objects with eyes.   OBJECTIVE Temp:  [99.5 F (37.5 C)-101.1 F (38.4 C)] 100.4 F (38 C) (02/13 0800) Pulse Rate:  [105-118] 111 (02/13 0800) Cardiac Rhythm:  [-] Normal sinus rhythm;Sinus tachycardia (02/13 1200) Resp:  [11-47] 27 (02/13 0800) BP: (112-129)/(59-75) 114/65 mmHg (02/13 0800) SpO2:  [99 %-100 %] 100 % (02/13 0800) FiO2 (%):  [40 %] 40 % (02/13 1108) Weight:  [242 lb 4.6 oz (109.9 kg)-245 lb 2.4 oz (111.2 kg)] 245 lb 2.4 oz (111.2 kg) (02/13 0800)  CBC:   Recent Labs Lab 10/08/15 0400 10/09/15 0445  WBC 16.8* 14.8*  HGB 8.3* 8.3*  HCT 27.3* 26.8*  MCV 96.1 97.1  PLT 376 PLATELET CLUMPS NOTED ON SMEAR, COUNT APPEARS ADEQUATE    Basic Metabolic Panel:   Recent Labs Lab 10/05/15 0550  10/08/15 0400 10/09/15 0445  NA 149*  < > 151* 149*  K 4.2  < > 3.4* 4.0  CL 115*  < > 120* 119*  CO2 22  < > 22 21*  GLUCOSE 215*  < > 229* 297*  BUN 72*  < > 45* 44*  CREATININE 1.65*  < > 1.18* 1.21*  CALCIUM 8.4*  < > 8.4* 8.4*  MG 2.3  --  2.3  --   PHOS 4.6  --  3.8  --   < > = values in this interval not displayed.  Lipid Panel:     Component Value Date/Time   CHOL 170 09/28/2015 0938   TRIG 245* 10/04/2015 0841   HDL 38* 09/28/2015 0938   CHOLHDL 4.5 09/28/2015 0938   VLDL UNABLE TO CALCULATE IF TRIGLYCERIDE OVER 400 mg/dL 16/05/9603 5409   LDLCALC UNABLE TO CALCULATE IF TRIGLYCERIDE OVER 400 mg/dL 81/19/1478 2956   OZHY8M:  Lab Results  Component Value Date   HGBA1C 6.9* 09/28/2015   Urine Drug Screen:     Component Value Date/Time   LABOPIA NONE DETECTED 09/28/2015 0226   LABBENZ NONE DETECTED 09/28/2015 0226   AMPHETMU  NONE DETECTED 09/28/2015 0226   THCU NONE DETECTED 09/28/2015 0226   LABBARB NONE DETECTED 09/28/2015 0226      IMAGING I have personally reviewed the radiological images below and agree with the radiology interpretations.  Ct Head Wo Contrast 09/28/2015  1. New calcific density in the left sylvian fissure, possible calcific distal M2 embolus. No visible acute infarct or hemorrhage. 2. Advanced chronic small vessel disease with multiple lacunar infarcts.   CTA NECK 09/28/2015   Moderately motion degraded examination, limited by LEFT venous contamination. No definite acute vascular process or hemodynamically significant stenosis. 3.5 x 5.7 cm dominant LEFT thyroid not oral for which follow up thyroid sonogram is recommended on a nonemergent basis.   CTA HEAD 09/28/2015   LEFT M2 segment occlusion corresponding to sylvian fissure density on today's CT, likely acute. Complete circle of Willis.  Mild intracranial atherosclerosis. Moderate calcific atherosclerosis the carotid siphons.   Cerebral Angiogram 09/28/2015 S/P Lt internal carotid arteriogram followed by complete revascularization of occluded dominant inferior division of LT MCA with x pass with trevoprovue 4 mmx 30 mm retrieval device and 6 mg of superselective Integrelin achieving a TICI 3 flow.  MRI  Large acute hemorrhagic left hemispheric infarct involves the majority of the left middle cerebral artery distribution. Hemorrhage is most notable involving the left posterior temporal -parietal aspect of this acute infarct. Local mass effect upon the left lateral ventricle. At most there is minimal bowing of the septum to the right. Several small acute nonhemorrhagic right hemispheric infarcts involving right caudate head, superior aspect of the post limb right internal capsule, right frontal lobe, superior right lenticular nucleus and right occipital lobe. Several small acute nonhemorrhagic cerebellar infarcts greater on the  right. Remote left lenticular nucleus infarct or hematoma with blood-stained cleft now noted. Moderate to marked chronic small vessel disease changes. Atrophy.  EEG Abnormalities: 1) possible attenuation of sleep structures in the left central region 2) lack of waking state Clinical Interpretation: This EEG is consistent with a sleep/sedation EEG with a possible suggestion of a left central cerebral dysfunction, though I don't think this is definite by this study.  2D echo - Systolic function was normal. The estimated ejection fraction was in the range of 55% to 60%. Wall motion was normal; there were no regional wall motion abnormalities. Doppler parameters are consistent with abnormal left ventricular relaxation (grade 1 diastolic dysfunction).  LE venous doppler -   no DVT   Dg Chest Port 1 View 10/08/2015  IMPRESSION: Atelectasis left mid lung. Lungs elsewhere clear. No change in cardiac silhouette. Tube and catheter positions as described without pneumothorax.   10/09/2015 IMPRESSION: Slight interval improvement in the pulmonary interstitium. Persistent subsegmental atelectasis in the left mid lung. No pleural effusion or pneumothorax. The support tubes are in reasonable position.    Physical exam  Temp:  [99.5 F (37.5 C)-101.1 F (38.4 C)] 100.4 F (38 C) (02/13 0800) Pulse Rate:  [105-118] 111 (02/13 0800) Resp:  [11-47] 27 (02/13 0800) BP: (112-129)/(59-75) 114/65 mmHg (02/13 0800) SpO2:  [99 %-100 %] 100 % (02/13 0800) FiO2 (%):  [40 %] 40 % (02/13 1108) Weight:  [242 lb 4.6 oz (109.9 kg)-245 lb 2.4 oz (111.2 kg)] 245 lb 2.4 oz (111.2 kg) (02/13 0800)  General - Well nourished, well developed, s/p trach and PEG.  Ophthalmologic - Fundi not visualized due to noncooperation.  Cardiovascular - Regular rhythm, but tachycardia.  Neruo - eyes open on voice, but nonverbal, still lethargic, s/p trach and peg, on ventilation. Not following commands, but tracking  objects with left gaze preference, doll's eye positive to both directions. Not blinking to visual threat bilaterally. PERRL. Right facial droop. tongue in middle inside mouth. Spontaneous movement LUE and LLE, on pain stimulation LUE against gravity but LLE not against gravity. RUE and RLE hemiplegia, with increased tone. DTR 1+, no babinski. Sensation, gait and coordination not tested.   ASSESSMENT/[name] is a 60 y.o. female with history of left BG ICH in 2012, hypertension, cocaine abuse, renal failure and hepatitis C presenting with altered mental status, right side weakness and leftward gaze. Also concerning for possible seizure activity initial presentation. She did not receive IV t-PA due to hx ICH, CTA showed a L M2 occlusion, she was sent to IR where she received complete TICI3 revascularization of the L MCA with mechanical thrombectomy with trevo and IA Integrilin.  Stroke:  left MCA infarct with hemorrhagic transformation s/p revascularizationf of L M2, multifocal scattered infarcts involving bilateral posterior and anterior circulation, consistent with cardioembolic source although pt does have significant large vessel intracranial atherosclerosis as well as cocaine abuse.  Resultant  global aphasia and  dense right hemiplegia  MRI - multifocal scattered infarcts involving bilateral posterior and anterior circulation, largest at left MCA territory with patchy hemorrhagic transformation on MRI but not on CT  Repeat CT - left MCA hemorrhagic infarct   CTA head L M2 occlusion, b/l ICA supraclinoid segment significant atherosclerosis, L>R with moderate to severe stenosis.  CTA neck L thyroid nodule  2D Echo  EF 55-60%  LE venous doppler negative for DVT  LDL unable to calculate due to high TG   HgbA1c 6.9  UDS positive for cocaine  Heparin subq for VTE prophylaxis Diet NPO time specified  No antithrombotic prior to admission, now on ASA .   Ongoing  aggressive stroke risk factor management  Therapy recommendations:  pending   Disposition:  pending   ? Seizure  Questionable seizure presentation initially as reported in chart  EEG showed no seizure but under sedation  Hold off AEDs at this time  No seizure activity since admission  Hx of ICH  2012 left BG ICH; during hospital course:  BP high at 200s  Urine positive with cocaine   S/p trach and PEG at that time  Lost follow up since  Acute respiratory failure  Intubated in ED due to airway protection  S/p trach, not able to wean off ventilation  Off precedex, intermittent agitation  On ativan and Risperdal - gradually taper off ativan  Sputum culture continue to show MRSA 10/06/15  On vancomycin   Still has low grade fever and tachycardia  CCM following  Bacteremia   Positive blood Cx 10/05/15 G+ in cluster with coagulase negative 2/2  Leukocytosis WBC 16.2->16.8->14.8  Sensitive to vanco  Central line placed  CCM on board  DM  A1C 6.9  However, glucose high on CBG  High dose SSI  On lantus 20, increase to 35 units  CBG monitoring  Hypernatremia  Na 152->151->149  On free water  Hypertensive Emergency  BP as high as 202/124  off cardene drip  Stable   On clonidine patch  Hyperlipidemia  Home meds:  No statin  LDL not able to calculate due to high TG , goal < 70  Total chol 170  on lipitor   Dysphagia   Hx Dysphagia secondary to ICH in 2012 with PEG  S/p PEG  On TF  Cocaine abuse  urine positive both in 2012 and this admission  Likely one of the causes of intracranial vascular stenosis  Need cessation counseling   Other Stroke Risk Factors  Obesity  Other Active Problems  Hepatitis C   Hospital day # 11  This patient is critically ill due to large left MCA infarct with left M2 occlusion s/p intervention, hemorrhagic transformation, bacteremia and at significant risk of neurological worsening,  death form recurrent infarcts, increased ICH, brain herniation, cerebral edema, heart failure and sepsis. This patient's care requires constant monitoring of vital signs, hemodynamics, respiratory and cardiac monitoring, review of multiple databases, neurological assessment, discussion with family, other specialists and medical decision making of high complexity. I spent 45 minutes of neurocritical care time in the care of this patient.  Linda Plan, MD PhD Stroke Neurology 10/09/2015 12:35 PM     To contact Stroke Continuity provider, please refer to [email or web address]. After hours, contact General Neurology

## 2015-10-09 NOTE — Progress Notes (Signed)
PULMONARY / CRITICAL CARE MEDICINE   Name: Linda [name] MRN: [id number] DOB: 1956/01/13    ADMISSION DATE:  09/28/2015 CONSULTATION DATE:  2/2  REFERRING MD:  Neuro  CHIEF COMPLAINT:  Obtunded   SUBJECTIVE:  Failed weaning this AM due to increased RR and secretion.  VITAL SIGNS: BP 100/56 mmHg  Pulse 109  Temp(Src) 99.5 F (37.5 C) (Axillary)  Resp 14  Ht  (1.702 m)  Wt 111.2 kg (245 lb 2.4 oz)  BMI 38.39 kg/m2  SpO2 100%  VENTILATOR SETTINGS: Vent Mode:  [-] PRVC FiO2 (%):  [40 %] 40 % Set Rate:  [20 bmp] 20 bmp Vt Set:  [490 mL] 490 mL PEEP:  [5 cmH20] 5 cmH20 Plateau Pressure:  [15 cmH20-19 cmH20] 18 cmH20  INTAKE / OUTPUT: I/O last 3 completed shifts: In: 2130 [I.V.:50; Other:250; NG/GT:1530; IV Piggyback:300] Out: -   PHYSICAL EXAMINATION: General: on vent, sedated or agitated  Neuro: opens eyes with stimulation, becomes agitated at times. Rt side flaccid HEENT: trach site clean Cardiac: RRR, Nl S1/S2, -M/R/G. Chest: Coarse BS bilaterally. Abd: g tube site clean Ext: 1+ edema Skin: no rashes  LABS:  BMET  Recent Labs Lab 10/07/15 0855 10/08/15 0400 10/09/15 0445  NA 151* 151* 149*  K 3.4* 3.4* 4.0  CL 119* 120* 119*  CO2 22 22 21*  BUN 55* 45* 44*  CREATININE 1.24* 1.18* 1.21*  GLUCOSE 185* 229* 297*   Electrolytes  Recent Labs Lab 10/04/15 0400 10/05/15 0550  10/07/15 0855 10/08/15 0400 10/09/15 0445  CALCIUM 8.5* 8.4*  < > 8.7* 8.4* 8.4*  MG 2.2 2.3  --   --  2.3  --   PHOS 4.9* 4.6  --   --  3.8  --   < > = values in this interval not displayed. CBC  Recent Labs Lab 10/07/15 0430 10/08/15 0400 10/09/15 0445  WBC 16.2* 16.8* 14.8*  HGB 7.9* 8.3* 8.3*  HCT 25.9* 27.3* 26.8*  PLT 344 376 PLATELET CLUMPS NOTED ON SMEAR, COUNT APPEARS ADEQUATE   Coag's  Recent Labs Lab 10/04/15 0400  APTT 29  INR 1.16   Sepsis Markers  Recent Labs Lab 10/06/15 1223 10/07/15 0430 10/08/15 0400  PROCALCITON 0.37 0.33  0.25   ABG  Recent Labs Lab 10/03/15 0450 10/04/15 0416 10/05/15 0424  PHART 7.409 7.412 7.422  PCO2ART 32.4* 32.7* 29.9*  PO2ART 60.3* 63.2* 101*   Glucose  Recent Labs Lab 10/08/15 1748 10/08/15 2029 10/09/15 0042 10/09/15 0458 10/09/15 0800 10/09/15 1245  GLUCAP 244* 240* 277* 245* 275* 275*   Imaging  I reviewed CXR myself, trach in good position.  Dg Chest Port 1 View  10/09/2015  CLINICAL DATA:  Respiratory failure, history of CVA, elevated white blood cell count, fever. EXAM: PORTABLE CHEST 1 VIEW COMPARISON:  Portable chest x-ray of October 08, 2015. FINDINGS: The lungs are borderline hypoinflated. The tracheostomy appliance tip lies at the superior margin of the clavicular heads. The pulmonary interstitial markings are slightly less prominent today. There is confluent density in the left mid lung. The heart is normal in size. The pulmonary vascularity is not engorged. The right internal jugular venous catheter tip projects over the midportion of the SVC. There is no pleural effusion. The observed bony thorax is unremarkable. IMPRESSION: Slight interval improvement in the pulmonary interstitium. Persistent subsegmental atelectasis in the left mid lung. No pleural effusion or pneumothorax. The support tubes are in reasonable position. Electronically Signed   By: David  [place]  M.D.   On: 10/09/2015 07:29   STUDIES:  EEG 2/02 >> consistent with a sleep/sedation EEG with a possible suggestion of a left central cerebral dysfunction Echo 2/03 >> EF 55 to 60%, grade 1 diastolic dysfx Doppler legs b/l 2/04 >> no DVT  CULTURES: 2/2 Blood >> negative 2/2 Urine >> negative 2/2 Sputum >> oral flora 2/4 Sputum >> MRSA 2/4 Blood >> Coag neg Staph 2/9 Blood >>GPC>> 2/10 Sputum >>MRSA  ANTIBIOTICS: 2/7 Vancomycin >>  2/10 Fortaz >> 2/12  SIGNIFICANT EVENTS: 2/2 Admit, neuro IR >> revascularization of Lt MCA inferior division 2/7 Trach, G tube  LINES/TUBES: 2/02 ETT >>  2/07 2/07 Trach (JY) >>  2/07 Gtube (CCS) >> 2/10 Rt IJ CVL >>  DISCUSSION: 60 yo female with Lt MCA CVA, VDRF.  UDS positive for cocaine.  Failed to wean from vent due to mental status, and required tracheostomy.  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  ASSESSMENT / PLAN:  NEUROLOGY A: Acute encephalopathy 2nd to Lt MCA CVA and cocaine. P: Wean off precedex to keep RASS 0 Continue ASA Add clonidne for agitation and substance abuse. May need to bump up risperdal.  PULMONARY A: Compromised airway in setting of CVA. Failure to wean from vent s/p tracheostomy 2/07. P:   Pressure support wean as able. Needs LTAC vs vent SNF pending insurance approval. F/u CXR  CARDIOVASCULAR A:  Hx of HTN, exacerbated by cocaine use.  P:  Goal SBP < 160   RENAL  Recent Labs Lab 10/07/15 0855 10/08/15 0400 10/09/15 0445  NA 151* 151* 149*   Lab Results  Component Value Date   CREATININE 1.21* 10/09/2015   CREATININE 1.18* 10/08/2015   CREATININE 1.24* 10/07/2015   CREATININE 1.27 02/28/2014   CREATININE 1.15 04/10/2012   CREATININE 1.32* 11/13/2011   A:   CKD stage 2 >> baseline renal fx 1.27 from 02/28/14. Hypernatremia. P:   Monitor renal fx, urine outpt Increase free water, f/u BMET Treat glucose more aggressively  Will need diureses once sodium improves.  GASTROINTESTINAL A:   Dysphagia s/p G tube 2/07. P:   Tube feeds Protonix for SUP  HEMATOLOGIC A:   Anemia of critical illness.  Recent Labs  10/08/15 0400 10/09/15 0445  HGB 8.3* 8.3*   P:  F/u CBC intermittently SQ heparin, SCD's for DVT prevention  INFECTIOUS A:   Fever with HCAP from MRSA, and Coag neg Staph in Blood cx from 2/04.  P:   Day 6 of vancomycin Repeated blood cx 2/09>>GPC>>coag neg Repeat sputum cx 2/10>>GPC/GNR>> Procalcitonin 0.33 2/11 Add fortaz 2/10>>dc  ENDOCRINE CBG (last 3)   Recent Labs  10/09/15 0458  10/09/15 0800 10/09/15 1245  GLUCAP 245* 275* 275*   A:   DM type II. P:   SSI Added lantus 2/09  Disposition >> will likely need LTAC, depending on her vent requirements.   Discussed with bedside RN.  Alyson Reedy, M.D. [facility] Pulmonary/Critical Care Medicine. Pager: [phone]. After hours pager: [phone].  10/09/2015 3:42 PM

## 2015-10-10 DIAGNOSIS — L8990 Pressure ulcer of unspecified site, unspecified stage: Secondary | ICD-10-CM

## 2015-10-10 DIAGNOSIS — Z9911 Dependence on respirator [ventilator] status: Secondary | ICD-10-CM | POA: Insufficient documentation

## 2015-10-10 DIAGNOSIS — R7881 Bacteremia: Secondary | ICD-10-CM | POA: Insufficient documentation

## 2015-10-10 DIAGNOSIS — Z430 Encounter for attention to tracheostomy: Secondary | ICD-10-CM

## 2015-10-10 LAB — CBC
HEMATOCRIT: 23.3 % — AB (ref 36.0–46.0)
HEMOGLOBIN: 7 g/dL — AB (ref 12.0–15.0)
MCH: 28.9 pg (ref 26.0–34.0)
MCHC: 30 g/dL (ref 30.0–36.0)
MCV: 96.3 fL (ref 78.0–100.0)
Platelets: 376 10*3/uL (ref 150–400)
RBC: 2.42 MIL/uL — AB (ref 3.87–5.11)
RDW: 14.7 % (ref 11.5–15.5)
WBC: 14 10*3/uL — ABNORMAL HIGH (ref 4.0–10.5)

## 2015-10-10 LAB — BASIC METABOLIC PANEL
Anion gap: 11 (ref 5–15)
BUN: 54 mg/dL — ABNORMAL HIGH (ref 6–20)
CHLORIDE: 115 mmol/L — AB (ref 101–111)
CO2: 22 mmol/L (ref 22–32)
Calcium: 8.5 mg/dL — ABNORMAL LOW (ref 8.9–10.3)
Creatinine, Ser: 1.4 mg/dL — ABNORMAL HIGH (ref 0.44–1.00)
GFR calc non Af Amer: 40 mL/min — ABNORMAL LOW (ref >60)
GFR, EST AFRICAN AMERICAN: 46 mL/min — AB (ref >60)
Glucose, Bld: 288 mg/dL — ABNORMAL HIGH (ref 65–99)
POTASSIUM: 4.4 mmol/L (ref 3.5–5.1)
SODIUM: 148 mmol/L — AB (ref 135–145)

## 2015-10-10 LAB — GLUCOSE, CAPILLARY
GLUCOSE-CAPILLARY: 282 mg/dL — AB (ref 65–99)
GLUCOSE-CAPILLARY: 259 mg/dL — AB (ref 65–99)
GLUCOSE-CAPILLARY: 244 mg/dL — AB (ref 65–99)
GLUCOSE-CAPILLARY: 253 mg/dL — AB (ref 65–99)
GLUCOSE-CAPILLARY: 225 mg/dL — AB (ref 65–99)
Glucose-Capillary: 242 mg/dL — ABNORMAL HIGH (ref 65–99)

## 2015-10-10 LAB — VANCOMYCIN, TROUGH: VANCOMYCIN TR: 21 ug/mL — AB (ref 10.0–20.0)

## 2015-10-10 MED ORDER — INSULIN GLARGINE 100 UNIT/ML ~~LOC~~ SOLN
25.0000 [IU] | Freq: Two times a day (BID) | SUBCUTANEOUS | Status: DC
  Administered 2015-10-10 – 2015-10-11 (×2): 25 [IU] via SUBCUTANEOUS
  Filled 2015-10-10 (×3): qty 0.25

## 2015-10-10 MED ORDER — VANCOMYCIN HCL IN DEXTROSE 750-5 MG/150ML-% IV SOLN
750.0000 mg | Freq: Two times a day (BID) | INTRAVENOUS | Status: DC
  Filled 2015-10-10: qty 150

## 2015-10-10 MED ORDER — LORAZEPAM 2 MG/ML IJ SOLN: 1.0000 mg | Freq: Every day | INTRAMUSCULAR | Status: DC

## 2015-10-10 MED ORDER — VANCOMYCIN HCL IN DEXTROSE 750-5 MG/150ML-% IV SOLN
750.0000 mg | Freq: Two times a day (BID) | INTRAVENOUS | Status: DC
  Administered 2015-10-10 – 2015-10-14 (×8): 750 mg via INTRAVENOUS
  Filled 2015-10-10 (×9): qty 150

## 2015-10-10 NOTE — Progress Notes (Signed)
Nutrition Follow-up  DOCUMENTATION CODES:   Obesity unspecified  INTERVENTION:    Continue Vital High Protein formula at 45 ml/hr with 30 ml Prostat BID  Above TF regimen provides 1280 kcals, 124 gm protein, 903 ml of water  NUTRITION DIAGNOSIS:   Inadequate oral intake related to inability to eat as evidenced by NPO status, ongoing   GOAL:   Provide needs based on ASPEN/SCCM guidelines, met  MONITOR:   Vent status, TF tolerance, Labs, Weight trends, Skin, I & O's  ASSESSMENT:   Pt with history of stroke (had trach/peg) admitted L MCA +cocaine s/p clot retrieval 2/2.   Patient s/p procedure 2/8: PERCUTANEOUS ENDOSCOPIC GASTROSTOMY (PEG) PLACEMENT PERCUTANEOUS TRACHEOSTOMY   Patient is currently on ventilator support Temp (24hrs), Avg:100.1 F (37.8 C), Min:97.6 F (36.4 C), Max:101.4 F (38.6 C)   Current TF regimen: Vital HP formula at goal rate of 45 ml/hr with Prostat liquid protein 30 ml BID via PEG tube providing 1280 kcals, 124 gm protein, 903 ml of water.  CWOCN note 2/14 reviewed.  Pt with MDRPI to left side of neck, ITD related to moisture.  Diet Order:  Diet NPO time specified  Skin:  Wound (see comment) (MDRPI to left side of neck)  Last BM:  2/12  Height:   Ht Readings from Last 1 Encounters:  09/28/15 5' 7" (1.702 m)    Weight:   Wt Readings from Last 1 Encounters:  10/10/15 249 lb 1.9 oz (113 kg)    Ideal Body Weight:  61.3 kg  BMI:  Body mass index is 39.01 kg/(m^2).  Estimated Nutritional Needs:   Kcal:  1156-1471   Protein:  >/= 122  Fluid:  >/=1.2 L  EDUCATION NEEDS:   No education needs identified at this time   Katie , RD, LDN Pager #: [phone] After-Hours Pager #: [phone]   

## 2015-10-10 NOTE — Progress Notes (Signed)
Pharmacy Antibiotic Note  Linda [name] is a 60 y.o. female on vancomycin for CoNS bacteremia and MRSA pneumonia.   Now day #7 of abx for CONS bacteremia, MRSA PNA. Ceftaz added for continued fevers but stopped on 2/12. Tmax 101.4 yesterday, WBC down to 14.8. Scr up to 1.4, CrCl ~52ml/min.   Plan: Continue vancomycin  IV Q12 Monitor clinical picture, renal function, VT at Css F/U C&S, abx deescalation / LOT  Height:  (170.2 cm) Weight: 249 lb 1.9 oz (113 kg) IBW/kg (Calculated) : 61.6  Temp (24hrs), Avg:100.1 F (37.8 C), Min:97.6 F (36.4 C), Max:101.4 F (38.6 C)   Recent Labs Lab 10/05/15 0325  10/06/15 1340 10/07/15 0430 10/07/15 0855 10/07/15 1728 10/08/15 0400 10/09/15 0445 10/10/15 1205 10/10/15 1245  WBC 18.9*  --   --  16.2*  --   --  16.8* 14.8* 14.0*  --   CREATININE  --   < > 1.55*  --  1.24*  --  1.18* 1.21* 1.40*  --   VANCOTROUGH  --   --   --   --   --  13  --   --   --  21*  < > = values in this interval not displayed.  Estimated Creatinine Clearance: 55.5 mL/min (by C-G formula based on Cr of 1.4).    No Known Allergies  Antimicrobials this admission: Vancomycin 2/7 >> Ceftazidime 2/10 >> 2/12  Dose adjustments this admission: 2/11: VT = 13 on 1250 mg q24h 2/14: VT = 21 on  IV Q12 (Drawn ~1hr early)  Microbiology results: 2/10 TA - MRSA 2/9 Blood - NGTD 2/4 Blood 2/2 CoNS 2/4 TA - MRSA 2/2 Blood -NEG 2/2 TA - NEG 2/2 Urine - NEG 2/2 MRSA - NEG  Enzo Bi, PharmD, BCPS Clinical Pharmacist Pager [phone] 10/10/2015 1:47 PM

## 2015-10-10 NOTE — Progress Notes (Signed)
10/10/2015 Rn notice patient sight was still having dark blood and clots at 1230. Patient dressing was change. Trauma was page and made aware. Per Charma Igo continue to change dressing and it is okay to continue running tube feeding [facility].

## 2015-10-10 NOTE — Consult Note (Signed)
[place] for Infectious Disease  Total days of antibiotics 8        Day 7 vancomycin               Reason for Consult: persistent bacteremia, and fever   Referring Physician: xu  Principal Problem:   CVA (cerebral infarction) Active Problems:   HTN (hypertension)   Cocaine abuse   Acute respiratory failure with hypoxia (HCC)   Stroke (cerebrum) (HCC)   Fever, unspecified   Leukocytosis   Ischemic stroke ([place])   Respiratory failure (HCC)   Cytotoxic brain edema ([place])   Cerebrovascular accident (CVA) due to thrombosis of cerebral artery ([place])   Cerebrovascular accident (CVA) due to occlusion of cerebral artery ([place])   Coarctation of aorta, recurrent, post-intervention   Pressure ulcer    HPI: Linda [name] is a 60 y.o. female  With hx of chronic hep C without hepatic coma, HTN, cocaine use, hx of ICH in 2012 with residual right sided weakness s/p trach and s/p peg at that time, presented on 2/2 with unresponsiveness with left preference gaze and right facial droop. CT A of brain showed left MCA occlusion and underwent IR thrombectomy. Still poor neurologic recovery. On 2/8 had peg adn trach placement. Underwent work up for stroke but only had TTE which that did not show any vegetations that would account for Large acute hemorrhagic left hemispheric infarct involves the majority of the left middle cerebral artery distribution. There is also several small acute nonhemorrhagic right hemispheric infarcts involving right caudate head, superior aspect of the post limb right internal capsule, right frontal lobe, superior right lenticular nucleus and right occipital lobe. Several small acute nonhemorrhagic cerebellar infarcts greater on the right. She has been febrile. Her blood cx on 2/4 showed 2 sets having CoNS as well as on 2/9 repeat blood cx show 2 sets + CoNS (oxa R). Due to ongoing fevers, Resp cx revealed MRSA. She has been on vancomycin for 7 days without improvement to  fevers or leukocytosis which remains at 14K. She  Remains obtunded, not following any commands. Other potential sources of infection include that she is having thick secretions from trach, cxr shows some congestion with possible left infiltrate. She has retained left IJ, plus has liquifying hematoma that is draining from peg site.   Past Medical History  Diagnosis Date  . Hep C w/o coma, chronic ([place])   . Cocaine abuse   . Acute renal failure ([place])   . Hypertension   . Stroke [facility]) 2012    hemorrhagic    Allergies: No Known Allergies  MEDICATIONS: .  stroke: mapping our early stages of recovery book   Does not apply Once  . antiseptic oral rinse  7 mL Mouth Rinse 10 times per day  . aspirin  325 mg Per Tube Daily  . atorvastatin  10 mg Oral q1800  . chlorhexidine gluconate  15 mL Mouth Rinse BID  . cloNIDine  0.1 mg Transdermal Weekly  . feeding supplement (PRO-STAT SUGAR FREE 64)  30 mL Per Tube BID  . free water  300 mL Per Tube Q4H  . heparin subcutaneous  5,000 Units Subcutaneous 3 times per day  . insulin aspart  0-20 Units Subcutaneous 6 times per day  . insulin glargine  35 Units Subcutaneous QHS  . [START ON 10/11/2015] LORazepam  1 mg Intravenous QHS  . multivitamin with minerals  1 tablet Per Tube Daily  . pantoprazole sodium  40 mg  Per Tube QHS  . risperiDONE  2 mg Oral Q12H  . sodium chloride flush  10-40 mL Intracatheter Q12H  . vancomycin  750 mg Intravenous Q12H    Social History  Substance Use Topics  . Smoking status: Unknown If Ever Smoked  . Smokeless tobacco: None  . Alcohol Use: No    No family history on file.  Review of Systems  Unable to obtain since she is obtunded  OBJECTIVE: Temp:  [97.6 F (36.4 C)-101.4 F (38.6 C)] 100 F (37.8 C) (02/14 1220) Pulse Rate:  [101-117] 101 (02/14 1220) Resp:  [6-30] 13 (02/14 1220) BP: (115-174)/(59-82) 117/64 mmHg (02/14 1220) SpO2:  [99 %-100 %] 99 % (02/14 1220) FiO2 (%):  [40 %] 40 % (02/14  1125) Weight:  [249 lb 1.9 oz (113 kg)] 249 lb 1.9 oz (113 kg) (02/14 0332) Physical Exam  Constitutional: appears well-developed and well-nourished. No distress.  HENT: [place]/AT, eyes closed but appears to have left gaze preference, no scleral icterus Mouth/Throat: Oropharynx is clear and moist. No oropharyngeal exudate.  Neck: trach in place with righ IJ catheter loosely suture Cardiovascular: Normal rate, regular rhythm and normal heart sounds. Exam reveals no gallop and no friction rub.  No murmur heard.  Pulmonary/Chest: Effort normal and breath sounds are course rhonchi throughout  Abdominal: Soft. Bowel sounds are decreased. Peg with bandage.  exhibits no distension. There is no tenderness.  Neurological: does not response to verbal stimuli. Clonus to right foot, 0/5 strength in all extremities Skin: Skin is warm and dry. No rash noted. No erythema. Dressing to pannus for superficial abrasion   LABS: Results for orders placed or performed during the hospital encounter of 09/28/15 (from the past 48 hour(s))  Glucose, capillary     Status: Abnormal   Collection Time: 10/08/15  3:48 PM  Result Value Ref Range   Glucose-Capillary 251 (H) 65 - 99 mg/dL  Glucose, capillary     Status: Abnormal   Collection Time: 10/08/15  5:48 PM  Result Value Ref Range   Glucose-Capillary 244 (H) 65 - 99 mg/dL  Glucose, capillary     Status: Abnormal   Collection Time: 10/08/15  8:29 PM  Result Value Ref Range   Glucose-Capillary 240 (H) 65 - 99 mg/dL  Glucose, capillary     Status: Abnormal   Collection Time: 10/09/15 12:42 AM  Result Value Ref Range   Glucose-Capillary 277 (H) 65 - 99 mg/dL  CBC     Status: Abnormal   Collection Time: 10/09/15  4:45 AM  Result Value Ref Range   WBC 14.8 (H) 4.0 - 10.5 K/uL    Comment: WHITE COUNT CONFIRMED ON SMEAR   RBC 2.76 (L) 3.87 - 5.11 MIL/uL   Hemoglobin 8.3 (L) 12.0 - 15.0 g/dL   HCT 26.8 (L) 36.0 - 46.0 %   MCV 97.1 78.0 - 100.0 fL   MCH 30.1 26.0  - 34.0 pg   MCHC 31.0 30.0 - 36.0 g/dL   RDW 15.1 11.5 - 15.5 %   Platelets  150 - 400 K/uL    PLATELET CLUMPS NOTED ON SMEAR, COUNT APPEARS ADEQUATE  Basic metabolic panel     Status: Abnormal   Collection Time: 10/09/15  4:45 AM  Result Value Ref Range   Sodium 149 (H) 135 - 145 mmol/L   Potassium 4.0 3.5 - 5.1 mmol/L   Chloride 119 (H) 101 - 111 mmol/L   CO2 21 (L) 22 - 32 mmol/L   Glucose, Bld 297 (  H) 65 - 99 mg/dL   BUN 44 (H) 6 - 20 mg/dL   Creatinine, Ser 1.21 (H) 0.44 - 1.00 mg/dL   Calcium 8.4 (L) 8.9 - 10.3 mg/dL   GFR calc non Af Amer 48 (L) >60 mL/min   GFR calc Af Amer 55 (L) >60 mL/min    Comment: (NOTE) The eGFR has been calculated using the CKD EPI equation. This calculation has not been validated in all clinical situations. eGFR's persistently <60 mL/min signify possible Chronic Kidney Disease.    Anion gap 9 5 - 15  Glucose, capillary     Status: Abnormal   Collection Time: 10/09/15  4:58 AM  Result Value Ref Range   Glucose-Capillary 245 (H) 65 - 99 mg/dL  Glucose, capillary     Status: Abnormal   Collection Time: 10/09/15  8:00 AM  Result Value Ref Range   Glucose-Capillary 275 (H) 65 - 99 mg/dL  Glucose, capillary     Status: Abnormal   Collection Time: 10/09/15 12:45 PM  Result Value Ref Range   Glucose-Capillary 275 (H) 65 - 99 mg/dL  Glucose, capillary     Status: Abnormal   Collection Time: 10/09/15  4:11 PM  Result Value Ref Range   Glucose-Capillary 256 (H) 65 - 99 mg/dL  Glucose, capillary     Status: Abnormal   Collection Time: 10/09/15  8:10 PM  Result Value Ref Range   Glucose-Capillary 246 (H) 65 - 99 mg/dL  Glucose, capillary     Status: Abnormal   Collection Time: 10/10/15 12:22 AM  Result Value Ref Range   Glucose-Capillary 242 (H) 65 - 99 mg/dL  Glucose, capillary     Status: Abnormal   Collection Time: 10/10/15  3:22 AM  Result Value Ref Range   Glucose-Capillary 282 (H) 65 - 99 mg/dL  Glucose, capillary     Status: Abnormal    Collection Time: 10/10/15  8:27 AM  Result Value Ref Range   Glucose-Capillary 259 (H) 65 - 99 mg/dL  CBC     Status: Abnormal   Collection Time: 10/10/15 12:05 PM  Result Value Ref Range   WBC 14.0 (H) 4.0 - 10.5 K/uL   RBC 2.42 (L) 3.87 - 5.11 MIL/uL   Hemoglobin 7.0 (L) 12.0 - 15.0 g/dL   HCT 23.3 (L) 36.0 - 46.0 %   MCV 96.3 78.0 - 100.0 fL   MCH 28.9 26.0 - 34.0 pg   MCHC 30.0 30.0 - 36.0 g/dL   RDW 14.7 11.5 - 15.5 %   Platelets 376 150 - 400 K/uL  Basic metabolic panel     Status: Abnormal   Collection Time: 10/10/15 12:05 PM  Result Value Ref Range   Sodium 148 (H) 135 - 145 mmol/L   Potassium 4.4 3.5 - 5.1 mmol/L   Chloride 115 (H) 101 - 111 mmol/L   CO2 22 22 - 32 mmol/L   Glucose, Bld 288 (H) 65 - 99 mg/dL   BUN 54 (H) 6 - 20 mg/dL   Creatinine, Ser 1.40 (H) 0.44 - 1.00 mg/dL   Calcium 8.5 (L) 8.9 - 10.3 mg/dL   GFR calc non Af Amer 40 (L) >60 mL/min   GFR calc Af Amer 46 (L) >60 mL/min    Comment: (NOTE) The eGFR has been calculated using the CKD EPI equation. This calculation has not been validated in all clinical situations. eGFR's persistently <60 mL/min signify possible Chronic Kidney Disease.    Anion gap 11 5 - 15  MICRO: 2/10 resp cx MRSA 2/9 blood cx CoNS 2/4 blood cx CoNS 2/2 blood cx NGTD 2/4 resp cx MRSA IMAGING: Dg Chest Port 1 View  10/09/2015  CLINICAL DATA:  Respiratory failure, history of CVA, elevated white blood cell count, fever. EXAM: PORTABLE CHEST 1 VIEW COMPARISON:  Portable chest x-ray of October 08, 2015. FINDINGS: The lungs are borderline hypoinflated. The tracheostomy appliance tip lies at the superior margin of the clavicular heads. The pulmonary interstitial markings are slightly less prominent today. There is confluent density in the left mid lung. The heart is normal in size. The pulmonary vascularity is not engorged. The right internal jugular venous catheter tip projects over the midportion of the SVC. There is no pleural  effusion. The observed bony thorax is unremarkable. IMPRESSION: Slight interval improvement in the pulmonary interstitium. Persistent subsegmental atelectasis in the left mid lung. No pleural effusion or pneumothorax. The support tubes are in reasonable position. Electronically Signed   By: David  Martinique M.D.   On: 10/09/2015 07:29        IMAGING Ct Head Wo Contrast 09/28/2015 1. New calcific density in the left sylvian fissure, possible calcific distal M2 embolus. No visible acute infarct or hemorrhage. 2. Advanced chronic small vessel disease with multiple lacunar infarcts.   CTA NECK 09/28/2015  Moderately motion degraded examination, limited by LEFT venous contamination. No definite acute vascular process or hemodynamically significant stenosis. 3.5 x 5.7 cm dominant LEFT thyroid not oral for which follow up thyroid sonogram is recommended on a nonemergent basis.   CTA HEAD 09/28/2015  LEFT M2 segment occlusion corresponding to sylvian fissure density on today's CT, likely acute. Complete circle of Willis. Mild intracranial atherosclerosis. Moderate calcific atherosclerosis the carotid siphons.   Cerebral Angiogram 09/28/2015 S/P Lt internal carotid arteriogram followed by complete revascularization of occluded dominant inferior division of LT MCA with x pass with trevoprovue 4 mmx 30 mm retrieval device and 6 mg of superselective Integrelin achieving a TICI 3 flow.  MRI  Large acute hemorrhagic left hemispheric infarct involves the majority of the left middle cerebral artery distribution. Hemorrhage is most notable involving the left posterior temporal -parietal aspect of this acute infarct. Local mass effect upon the left lateral ventricle. At most there is minimal bowing of the septum to the right. Several small acute nonhemorrhagic right hemispheric infarcts involving right caudate head, superior aspect of the post limb right internal capsule, right frontal lobe, superior right  lenticular nucleus and right occipital lobe. Several small acute nonhemorrhagic cerebellar infarcts greater on the right. Remote left lenticular nucleus infarct or hematoma with blood-stained cleft now noted. Moderate to marked chronic small vessel disease changes. Atrophy.  EEG Abnormalities: 1) possible attenuation of sleep structures in the left central region 2) lack of waking state Clinical Interpretation: This EEG is consistent with a sleep/sedation EEG with a possible suggestion of a left central cerebral dysfunction, though I don't think this is definite by this study.  2D echo - Systolic function was normal. The estimated ejection fraction was in the range of 55% to 60%. Wall motion was normal; there were no regional wall motion abnormalities. Doppler parameters are consistent with abnormal left ventricular relaxation (grade 1 diastolic dysfunction).  LE venous doppler - no DVT        Assessment/Plan:  60 yo female with Lt MCA CVA, VDRF.remains obtunded with fevers and leukocytosis. Failed to wean from vent due to mental status, and required tracheostomy and PEG. She has prior hx of hemorrhagic CVA  in 2012 with Rt sided weakness, hepatitis C, HTN, HLD, DM.  Recurrent bacteremia = concern for ongoing bacteremia meeting criteria for native valve endocarditis. TTE on 2/3, prior to bacteremia did not suggest evidence of endocarditis. Due to poor overall outcome, neurologic insult/obtunded, would rpesume endocarditis and not pursue TEE. Would recommend treatment with 6 wk using 2/14 is repeat blood cx drawn today are negative. Recommend to replace IJ with PIV for line holiday   MRSA HCAP =would be covered by vancomycin. Continue with pulmonary toilet  Leukocytosis = could also be due to hematoma next to peg. Will continue to monitor drainage and dressing changes to see if think it is infected  Fevers = difficult to tell if this could be central fever vs. Fever from  infection, such as bacteremia or hcap. contineu to monitor.

## 2015-10-10 NOTE — Progress Notes (Signed)
RT removed trach sutures per Dr. Molli Knock. No complications. Vital signs stable at this time. RT will continue to monitor.

## 2015-10-10 NOTE — Progress Notes (Addendum)
Inpatient Diabetes Program Recommendations  AACE/ADA: New Consensus Statement on Inpatient Glycemic Control (2015)  Target Ranges:  Prepandial:   less than 140 mg/dL      Peak postprandial:   less than 180 mg/dL (1-2 hours)      Critically ill patients:  140 - 180 mg/dL    Results for ARIAH, MOWER (MRN [id number]) as of 10/10/2015 07:31  Ref. Range 10/09/2015 00:42 10/09/2015 04:58 10/09/2015 08:00 10/09/2015 12:45 10/09/2015 16:11 10/09/2015 20:10  Glucose-Capillary Latest Ref Range: 65-99 mg/dL 782 (H) 956 (H) 213 (H) 275 (H) 256 (H) 246 (H)   Results for ARLET, MARTER (MRN [id number]) as of 10/10/2015 07:31  Ref. Range 10/10/2015 00:22 10/10/2015 03:22  Glucose-Capillary Latest Ref Range: 65-99 mg/dL 629 (H) 528 (H)    Home DM Meds: Lantus 60 units QHS  Current Insulin Orders: Novolog Resistant SSI (0-20 units) Q4 hours      Lantus 35 units QHS     -Note patient trached and receiving Vital HP Tube feeds @ 45 cc/hour.  -Note Lantus increased to 35 units QHS last PM.  -Glucose levels remain >200 mg/dl.     MD- Please consider adding Novolog Tube Feed coverage to help with glucose control-  Novolog 4 units Q4 hours (hold if tube feeds held for any reason)     --Will follow patient during hospitalization--  Ambrose Finland RN, MSN, CDE Diabetes Coordinator Inpatient Glycemic Control Team Team Pager: [phone] (8a-5p)

## 2015-10-10 NOTE — Progress Notes (Signed)
PULMONARY / CRITICAL CARE MEDICINE   Name: Linda [name] MRN: [id number] DOB: Jun 30, 1956    ADMISSION DATE:  09/28/2015 CONSULTATION DATE:  2/2  REFERRING MD:  Neuro  CHIEF COMPLAINT:  Obtunded   SUBJECTIVE:  Failed weaning this AM due to increased RR and secretion.  VITAL SIGNS: BP 117/64 mmHg  Pulse 101  Temp(Src) 100 F (37.8 C) (Axillary)  Resp 13  Ht  (1.702 m)  Wt 113 kg (249 lb 1.9 oz)  BMI 39.01 kg/m2  SpO2 99%  VENTILATOR SETTINGS: Vent Mode:  [-] PRVC FiO2 (%):  [40 %] 40 % Set Rate:  [20 bmp] 20 bmp Vt Set:  [490 mL] 490 mL PEEP:  [5 cmH20] 5 cmH20 Plateau Pressure:  [16 cmH20-20 cmH20] 16 cmH20  INTAKE / OUTPUT: I/O last 3 completed shifts: In: 3385 [I.V.:30; Other:250; EX/BM:8413; IV Piggyback:450] Out: -   PHYSICAL EXAMINATION: General: on vent, sedated or agitated  Neuro: opens eyes with stimulation, becomes agitated at times. Rt side flaccid HEENT: trach site clean Cardiac: RRR, Nl S1/S2, -M/R/G. Chest: Coarse BS bilaterally. Abd: g tube site clean Ext: 1+ edema Skin: no rashes  LABS:  BMET  Recent Labs Lab 10/08/15 0400 10/09/15 0445 10/10/15 1205  NA 151* 149* 148*  K 3.4* 4.0 4.4  CL 120* 119* 115*  CO2 22 21* 22  BUN 45* 44* 54*  CREATININE 1.18* 1.21* 1.40*  GLUCOSE 229* 297* 288*   Electrolytes  Recent Labs Lab 10/04/15 0400 10/05/15 0550  10/08/15 0400 10/09/15 0445 10/10/15 1205  CALCIUM 8.5* 8.4*  < > 8.4* 8.4* 8.5*  MG 2.2 2.3  --  2.3  --   --   PHOS 4.9* 4.6  --  3.8  --   --   < > = values in this interval not displayed. CBC  Recent Labs Lab 10/08/15 0400 10/09/15 0445 10/10/15 1205  WBC 16.8* 14.8* 14.0*  HGB 8.3* 8.3* 7.0*  HCT 27.3* 26.8* 23.3*  PLT 376 PLATELET CLUMPS NOTED ON SMEAR, COUNT APPEARS ADEQUATE 376   Coag's  Recent Labs Lab 10/04/15 0400  APTT 29  INR 1.16   Sepsis Markers  Recent Labs Lab 10/06/15 1223 10/07/15 0430 10/08/15 0400  PROCALCITON 0.37 0.33 0.25    ABG  Recent Labs Lab 10/04/15 0416 10/05/15 0424  PHART 7.412 7.422  PCO2ART 32.7* 29.9*  PO2ART 63.2* 101*   Glucose  Recent Labs Lab 10/09/15 1611 10/09/15 2010 10/10/15 0022 10/10/15 0322 10/10/15 0827 10/10/15 1213  GLUCAP 256* 246* 242* 282* 259* 244*   Imaging  I reviewed CXR myself, trach in good position.  No results found. STUDIES:  EEG 2/02 >> consistent with a sleep/sedation EEG with a possible suggestion of a left central cerebral dysfunction Echo 2/03 >> EF 55 to 60%, grade 1 diastolic dysfx Doppler legs b/l 2/04 >> no DVT  CULTURES: 2/2 Blood >> negative 2/2 Urine >> negative 2/2 Sputum >> oral flora 2/4 Sputum >> MRSA 2/4 Blood >> Coag neg Staph 2/9 Blood >>GPC>> 2/10 Sputum >>MRSA 2/12 repeat culture MRSA  ANTIBIOTICS: 2/7 Vancomycin >>  2/10 Fortaz >> 2/12  SIGNIFICANT EVENTS: 2/2 Admit, neuro IR >> revascularization of Lt MCA inferior division 2/7 Trach, G tube  LINES/TUBES: 2/02 ETT >> 2/07 2/07 Trach (JY) >>  2/07 Gtube (CCS) >> 2/10 Rt IJ CVL >>  I reviewed CXR myself, trach in good position, infiltrate noted.  DISCUSSION: 60 yo female with Lt MCA CVA, VDRF.  UDS positive for cocaine.  Failed to wean from vent due to mental status, and required tracheostomy.  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  ASSESSMENT / PLAN:  NEUROLOGY A: Acute encephalopathy 2nd to Lt MCA CVA and cocaine. P: D/C precedex. Continue ASA Continue clonidne for agitation and substance abuse. May need to bump up risperdal.  PULMONARY A: Compromised airway in setting of CVA. Failure to wean from vent s/p tracheostomy 2/07. P:   Pressure support wean as able. Needs vent SNF. F/u CXR.  CARDIOVASCULAR A:  Hx of HTN, exacerbated by cocaine use.  P:  Goal SBP < 160   RENAL  Recent Labs Lab 10/08/15 0400 10/09/15 0445 10/10/15 1205  NA 151* 149* 148*   Lab Results  Component  Value Date   CREATININE 1.40* 10/10/2015   CREATININE 1.21* 10/09/2015   CREATININE 1.18* 10/08/2015   CREATININE 1.27 02/28/2014   CREATININE 1.15 04/10/2012   CREATININE 1.32* 11/13/2011   A:   CKD stage 2 >> baseline renal fx 1.27 from 02/28/14. Hypernatremia. P:   Monitor renal fx, urine outpt Increased free water, f/u BMET Treat glucose more aggressively  Will need diureses once sodium improves.  GASTROINTESTINAL A:   Dysphagia s/p G tube 2/07. P:   Tube feeds Protonix for SUP  HEMATOLOGIC A:   Anemia of critical illness.  Recent Labs  10/09/15 0445 10/10/15 1205  HGB 8.3* 7.0*   P:  F/u CBC intermittently SQ heparin, SCD's for DVT prevention  INFECTIOUS A:   Fever with HCAP from MRSA, and Coag neg Staph in Blood cx from 2/04. Persistent MRSA infection. P:   Day 7 of vancomycin Repeated blood cx 2/09>>GPC>>coag neg Repeat sputum cx 2/10>>GPC/GNR>> Procalcitonin 0.33 2/11 Add fortaz 2/10>>dc Recommend ID consult.  ENDOCRINE CBG (last 3)   Recent Labs  10/10/15 0322 10/10/15 0827 10/10/15 1213  GLUCAP 282* 259* 244*   A:   DM type II. P:   SSI Added lantus 2/09  Disposition >> will likely need LTAC, depending on her vent requirements.   Discussed with neurology, consult ID.  Alyson Reedy, M.D. [facility] Pulmonary/Critical Care Medicine. Pager: [phone]. After hours pager: [phone].  10/10/2015 1:57 PM

## 2015-10-10 NOTE — Consult Note (Signed)
WOC wound consult note Reason for Consult:trach wound.  Patient with trach placed inpatient 10/04/15.  Now has MDRPI from trach.  Of note when I visited with patient today bedside nurse reports area under the left side of the patient's pannus as well.  Wound type: MDRPI (Medical Device Related Pressure Injury) left side of neck under trach plate ITD (Intertriginous Skin Damage) related to moisture Pressure Ulcer POA: No Measurement: Left side of neck: unable to measure due to trach sutures in place Left intertriginous space/under pannus: 0.2cm x 5cm x 0.1cm  Wound bed: Neck: pink, moist, some yellow slough in base, very difficult to assess with trach sutures in place currently Under pannus: 100% pink, moist Drainage (amount, consistency, odor) none under pannus.  Neck with thick yellow/red dressing, no odor Periwound: intact  Dressing procedure/placement/frequency: Added 1/2 pc. Of silicone foam under the trach plate the best I was able, until sutures out will not be able to assess the true size of the wound.  Will reeval once sutures out for updated topical care.  Foam to be changed PRN saturation with drainage. Antimicrobial wicking fabric (InterDry Ag+) to treat ITD under the pannus.   Discussed POC with patient and bedside nurse.  Re consult if needed, will not follow at this time. Thanks  Daris Harkins [employer], CWOCN [phone])

## 2015-10-10 NOTE — Progress Notes (Signed)
RT attempted to wean patient on 15/5 without success. Increased WOB and RR in the 40's. No complications. Vital signs stable at this time. RN aware. RT will continue to monitor.

## 2015-10-10 NOTE — Progress Notes (Signed)
10/10/15 Patient temp was 102.0 orally at 1730 she was given tylenol solution and Dr Lavon Paganini (neurology) was made aware. [facility] RN.

## 2015-10-10 NOTE — Clinical Social Work Note (Signed)
Clinical Social Work Assessment  Patient Details  Name: Linda [name] MRN: [id number] Date of Birth: 23-Nov-1955  Date of referral:  10/10/15               Reason for consult:  Facility Placement                Permission sought to share information with:  Family Supports Permission granted to share information::  No (pt DO)  Name::     Mudlogger::  Vent SNFs  Relationship::  sister  Contact Information:     Housing/Transportation Living arrangements for the past 2 months:  Single Family Home Source of Information:  Other (Comment Required) (siblings) Patient Interpreter Needed:  None Criminal Activity/Legal Involvement Pertinent to Current Situation/Hospitalization:  No - Comment as needed Significant Relationships:  Siblings Lives with:  Self Do you feel safe going back to the place where you live?  No Need for family participation in patient care:  Yes (Comment) (decision making)  Care giving concerns:  Pt requiring too much care to transition back home with family aid at this time.   Social Worker assessment / plan:  CSW spoke with pt sister/POA concerning MD recommendation for vent SNF.  CSW explained that patient is not weaning from vent at this time and if she continues to not make progress getting off the vent then she will need be transitioned to a Vent SNF to continue receiving care.  CSW explained Vent SNF placement and limited facility options.  CSW prepared patient sister that it is likely that pt will be placed far away.  Employment status:    Insurance information:  Medicaid In [place] PT Recommendations:  Not assessed at this time Information / Referral to community resources:  Skilled Nursing Facility  Patient/Family's Response to care:  Patient sister is agreeable to SNF placement and understands pt is too high needs to come home.    Patient/Family's Understanding of and Emotional Response to Diagnosis, Current Treatment, and Prognosis:  Sister is hopeful  that patient will make progress weaning and/or that patient can be placed closer to home.  Emotional Assessment Appearance:  Appears stated age Attitude/Demeanor/Rapport:  Unable to Assess Affect (typically observed):  Unable to Assess Orientation:   (intubated- per RN pt is not responsive) Alcohol / Substance use:  Illicit Drugs (cocaine) Psych involvement (Current and /or in the community):  No (Comment)  Discharge Needs  Concerns to be addressed:  Care Coordination, Discharge Planning Concerns Readmission within the last 30 days:  No Current discharge risk:  Physical Impairment Barriers to Discharge:  Continued Medical Work up, [employer] not available   Linda Ribas, LCSW 10/10/2015, 3:29 PM

## 2015-10-10 NOTE — Progress Notes (Signed)
10/10/2015 Rn notice while assessing patient at 0815  blood was under peg tube dressing. Site was assess Rn observe dark blood with clots. Dr Roda Shutters was made aware and Rn was told by primary MD to notify general surgery. Rn did page trauma and the were made aware. Trauma PA did come by and assess the area and stated there might be a hematoma. Lovie Macadamia RN

## 2015-10-10 NOTE — Progress Notes (Signed)
CSW spoke with pt sister regarding possible need for longer term Vent SNF placement- sister agreeable to starting search.  [facility] Sabina [place], [place]- [phone]- referral sent  [facility] [place], [place] [phone]- referral sent  [address], [place], [place] [phone]- referral sent  [facility], [place], [place] [phone]- referral sent  Full assessment to follow  Merlyn Lot, [facility] Clinical Social Worker [phone]

## 2015-10-10 NOTE — Progress Notes (Signed)
STROKE TEAM PROGRESS NOTE   SUBJECTIVE (INTERVAL HISTORY) No family is at the bedside. Still have low grade fever and tachycardia. Glucose still high on high dose of SSI. Will increase lantus to 25units bid. Na 148 today. Pt received multiple doses of ativan due to agitation with weaning trials. She is obtunded on today's round. PEG tube site found to have blood clot and oozing bright red blood, trauma team consulted and showed no active bleeding, will continue to monitor. ID consulted for continued bacteremia on vancomycin with fever.   OBJECTIVE Temp:  [99.8 F (37.7 C)-101.4 F (38.6 C)] 100 F (37.8 C) (02/14 1220) Pulse Rate:  [90-117] 90 (02/14 1524) Cardiac Rhythm:  [-] Sinus tachycardia (02/14 0701) Resp:  [6-30] 24 (02/14 1524) BP: (105-174)/(59-82) 105/68 mmHg (02/14 1524) SpO2:  [99 %-100 %] 99 % (02/14 1220) FiO2 (%):  [40 %] 40 % (02/14 1524) Weight:  [249 lb 1.9 oz (113 kg)] 249 lb 1.9 oz (113 kg) (02/14 0332)  CBC:   Recent Labs Lab 10/09/15 0445 10/10/15 1205  WBC 14.8* 14.0*  HGB 8.3* 7.0*  HCT 26.8* 23.3*  MCV 97.1 96.3  PLT PLATELET CLUMPS NOTED ON SMEAR, COUNT APPEARS ADEQUATE 376    Basic Metabolic Panel:   Recent Labs Lab 10/05/15 0550  10/08/15 0400 10/09/15 0445 10/10/15 1205  NA 149*  < > 151* 149* 148*  K 4.2  < > 3.4* 4.0 4.4  CL 115*  < > 120* 119* 115*  CO2 22  < > 22 21* 22  GLUCOSE 215*  < > 229* 297* 288*  BUN 72*  < > 45* 44* 54*  CREATININE 1.65*  < > 1.18* 1.21* 1.40*  CALCIUM 8.4*  < > 8.4* 8.4* 8.5*  MG 2.3  --  2.3  --   --   PHOS 4.6  --  3.8  --   --   < > = values in this interval not displayed.  Lipid Panel:     Component Value Date/Time   CHOL 170 09/28/2015 0938   TRIG 245* 10/04/2015 0841   HDL 38* 09/28/2015 0938   CHOLHDL 4.5 09/28/2015 0938   VLDL UNABLE TO CALCULATE IF TRIGLYCERIDE OVER 400 mg/dL 16/05/9603 5409   LDLCALC UNABLE TO CALCULATE IF TRIGLYCERIDE OVER 400 mg/dL 81/19/1478 2956   OZHY8M:  Lab  Results  Component Value Date   HGBA1C 6.9* 09/28/2015   Urine Drug Screen:     Component Value Date/Time   LABOPIA NONE DETECTED 09/28/2015 0226   LABBENZ NONE DETECTED 09/28/2015 0226   AMPHETMU NONE DETECTED 09/28/2015 0226   THCU NONE DETECTED 09/28/2015 0226   LABBARB NONE DETECTED 09/28/2015 0226      IMAGING I have personally reviewed the radiological images below and agree with the radiology interpretations.  Ct Head Wo Contrast 09/28/2015  1. New calcific density in the left sylvian fissure, possible calcific distal M2 embolus. No visible acute infarct or hemorrhage. 2. Advanced chronic small vessel disease with multiple lacunar infarcts.   CTA NECK 09/28/2015   Moderately motion degraded examination, limited by LEFT venous contamination. No definite acute vascular process or hemodynamically significant stenosis. 3.5 x 5.7 cm dominant LEFT thyroid not oral for which follow up thyroid sonogram is recommended on a nonemergent basis.   CTA HEAD 09/28/2015   LEFT M2 segment occlusion corresponding to sylvian fissure density on today's CT, likely acute. Complete circle of Willis.  Mild intracranial atherosclerosis. Moderate calcific atherosclerosis the carotid siphons.   Cerebral  Angiogram 09/28/2015 S/P Lt internal carotid arteriogram followed by complete revascularization of occluded dominant inferior division of LT MCA with x pass with trevoprovue 4 mmx 30 mm retrieval device and 6 mg of superselective Integrelin achieving a TICI 3 flow.  MRI  Large acute hemorrhagic left hemispheric infarct involves the majority of the left middle cerebral artery distribution. Hemorrhage is most notable involving the left posterior temporal -parietal aspect of this acute infarct. Local mass effect upon the left lateral ventricle. At most there is minimal bowing of the septum to the right. Several small acute nonhemorrhagic right hemispheric infarcts involving right caudate head, superior  aspect of the post limb right internal capsule, right frontal lobe, superior right lenticular nucleus and right occipital lobe. Several small acute nonhemorrhagic cerebellar infarcts greater on the right. Remote left lenticular nucleus infarct or hematoma with blood-stained cleft now noted. Moderate to marked chronic small vessel disease changes. Atrophy.  EEG Abnormalities: 1) possible attenuation of sleep structures in the left central region 2) lack of waking state Clinical Interpretation: This EEG is consistent with a sleep/sedation EEG with a possible suggestion of a left central cerebral dysfunction, though I don't think this is definite by this study.  2D echo - Systolic function was normal. The estimated ejection fraction was in the range of 55% to 60%. Wall motion was normal; there were no regional wall motion abnormalities. Doppler parameters are consistent with abnormal left ventricular relaxation (grade 1 diastolic dysfunction).  LE venous doppler -   no DVT   Dg Chest Port 1 View 10/08/2015  IMPRESSION: Atelectasis left mid lung. Lungs elsewhere clear. No change in cardiac silhouette. Tube and catheter positions as described without pneumothorax.   10/09/2015 IMPRESSION: Slight interval improvement in the pulmonary interstitium. Persistent subsegmental atelectasis in the left mid lung. No pleural effusion or pneumothorax. The support tubes are in reasonable position.    Physical exam  Temp:  [99.8 F (37.7 C)-101.4 F (38.6 C)] 100 F (37.8 C) (02/14 1220) Pulse Rate:  [90-117] 90 (02/14 1524) Resp:  [6-30] 24 (02/14 1524) BP: (105-174)/(59-82) 105/68 mmHg (02/14 1524) SpO2:  [99 %-100 %] 99 % (02/14 1220) FiO2 (%):  [40 %] 40 % (02/14 1524) Weight:  [249 lb 1.9 oz (113 kg)] 249 lb 1.9 oz (113 kg) (02/14 0332)  General - Well nourished, well developed, s/p trach and PEG.  Ophthalmologic - Fundi not visualized due to noncooperation.  Cardiovascular -  Regular rhythm, but tachycardia.  Neruo - eyes closed even with pain stimulation, nonverbal, obtunded, s/p trach and peg, on ventilation. Not following any commands. PERRL, doll's eye positive to both directions. Right facial droop. tongue in middle inside mouth. Spontaneous movement LUE and LLE on pain stimulation. RUE and RLE hemiplegia, with increased tone. DTR 1+, no babinski. Sensation, gait and coordination not tested.   ASSESSMENT/PLAN [name] is a 60 y.o. female with history of left BG ICH in 2012, hypertension, cocaine abuse, renal failure and hepatitis C presenting with altered mental status, right side weakness and leftward gaze. Also concerning for possible seizure activity initial presentation. She did not receive IV t-PA due to hx ICH, CTA showed a L M2 occlusion, she was sent to IR where she received complete TICI3 revascularization of the L MCA with mechanical thrombectomy with trevo and IA Integrilin.  Stroke:  left MCA infarct with hemorrhagic transformation s/p revascularizationf of L M2, multifocal scattered infarcts involving bilateral posterior and anterior circulation, consistent with cardioembolic source although pt does  have significant large vessel intracranial atherosclerosis as well as cocaine abuse.  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  CTA neck L thyroid nodule  2D Echo  EF 55-60%  LE venous doppler negative for DVT  LDL unable to calculate due to high TG   HgbA1c 6.9  UDS positive for cocaine  Heparin subq for VTE prophylaxis Diet NPO time specified  No antithrombotic prior to  admission, now on ASA .   Ongoing aggressive stroke risk factor management  Therapy recommendations:  pending   Disposition:  pending   Bacteremia   Positive blood Cx 10/05/15 G+ in cluster with coagulase negative 2/2  Leukocytosis WBC 16.2->16.8->14.8 -> 14.0  Sensitive to vanco  Central line placed  ID on board  Presumed endocarditis but hold off further work up due to limited functional improvement so far and likely poor prognosis  Hx of ICH  2012 left BG ICH; during hospital course:  BP high at 200s  Urine positive with cocaine   S/p trach and PEG at that time  Lost follow up since  Acute respiratory failure  Intubated in ED due to airway protection  S/p trach, not able to wean off ventilation  Off precedex, intermittent agitation  On ativan and Risperdal - gradually taper off ativan  Sputum culture continue to show MRSA 10/06/15  On vancomycin   Still has low grade fever and tachycardia  CCM following  DM  A1C 6.9 on admission  However, glucose high on CBG  High dose SSI  Increase lantus to 20 units bid  CBG monitoring  ? Seizure  Questionable seizure presentation initially as reported in chart  EEG showed no seizure but under sedation  Hold off AEDs at this time  No seizure activity since admission  Hypernatremia  Na 152->151->149->148  On free water  Hypertensive Emergency  BP as high as 202/124  off cardene drip  Stable   On clonidine patch  Hyperlipidemia  Home meds:  No statin  LDL not able to calculate due to high TG , goal < 70  Total chol 170  on lipitor   Dysphagia   Hx Dysphagia secondary to ICH in 2012 with PEG  S/p PEG  On TF  Cocaine abuse  urine positive both in 2012 and this admission  Likely one of the causes of intracranial vascular stenosis  Need cessation counseling   Other Stroke Risk Factors  Obesity  Other Active Problems  Hepatitis C   Hospital day #  12  This patient is critically ill due to large left MCA infarct with left M2 occlusion s/p intervention, hemorrhagic transformation, bacteremia and at significant risk of neurological worsening, death form recurrent infarcts, increased ICH, brain herniation, cerebral edema, heart failure and sepsis. This patient's care requires constant monitoring of vital signs, hemodynamics, respiratory and cardiac monitoring, review of multiple databases, neurological assessment, discussion with family, other specialists and medical decision making of high complexity. I spent 50 minutes of neurocritical care time in the care of this patient.  Marvel Plan, MD PhD Stroke Neurology 10/10/2015 4:20 PM     To contact Stroke Continuity provider, please refer to [email or web address]. After hours, contact General  Neurology

## 2015-10-10 NOTE — Progress Notes (Signed)
Ask to see PEG site with bleeding. Dressing removed, large amounts of clot noted, able to express clot from wound. No sign of fresh bleeding. Suspect liquefying hematoma left from time of procedure. Continue to change dressing prn, suspect will continue for 2-3 days. If it persists or she shows signs of blood loss please call.    Freeman Caldron, PA-C Pager: [phone] General Trauma PA Pager: [phone]

## 2015-10-11 DIAGNOSIS — D62 Acute posthemorrhagic anemia: Secondary | ICD-10-CM | POA: Insufficient documentation

## 2015-10-11 DIAGNOSIS — Z22322 Carrier or suspected carrier of Methicillin resistant Staphylococcus aureus: Secondary | ICD-10-CM

## 2015-10-11 LAB — CBC
HCT: 22.5 % — ABNORMAL LOW (ref 36.0–46.0)
HEMATOCRIT: 25.6 % — AB (ref 36.0–46.0)
HEMOGLOBIN: 6.8 g/dL — AB (ref 12.0–15.0)
Hemoglobin: 8.3 g/dL — ABNORMAL LOW (ref 12.0–15.0)
MCH: 29.4 pg (ref 26.0–34.0)
MCH: 31.3 pg (ref 26.0–34.0)
MCHC: 30.2 g/dL (ref 30.0–36.0)
MCHC: 32.4 g/dL (ref 30.0–36.0)
MCV: 97.4 fL (ref 78.0–100.0)
MCV: 96.6 fL (ref 78.0–100.0)
PLATELETS: 360 10*3/uL (ref 150–400)
PLATELETS: 356 10*3/uL (ref 150–400)
RBC: 2.31 MIL/uL — ABNORMAL LOW (ref 3.87–5.11)
RBC: 2.65 MIL/uL — ABNORMAL LOW (ref 3.87–5.11)
RDW: 15.1 % (ref 11.5–15.5)
RDW: 14.7 % (ref 11.5–15.5)
WBC: 13.9 10*3/uL — ABNORMAL HIGH (ref 4.0–10.5)
WBC: 16.5 10*3/uL — AB (ref 4.0–10.5)

## 2015-10-11 LAB — BASIC METABOLIC PANEL
Anion gap: 7 (ref 5–15)
BUN: 56 mg/dL — AB (ref 6–20)
CALCIUM: 8.1 mg/dL — AB (ref 8.9–10.3)
CO2: 23 mmol/L (ref 22–32)
CREATININE: 1.37 mg/dL — AB (ref 0.44–1.00)
Chloride: 113 mmol/L — ABNORMAL HIGH (ref 101–111)
GFR, EST AFRICAN AMERICAN: 48 mL/min — AB (ref >60)
GFR, EST NON AFRICAN AMERICAN: 41 mL/min — AB (ref >60)
Glucose, Bld: 243 mg/dL — ABNORMAL HIGH (ref 65–99)
Potassium: 4.1 mmol/L (ref 3.5–5.1)
SODIUM: 143 mmol/L (ref 135–145)

## 2015-10-11 LAB — ABO/RH: ABO/RH(D): O POS

## 2015-10-11 LAB — GLUCOSE, CAPILLARY
GLUCOSE-CAPILLARY: 189 mg/dL — AB (ref 65–99)
GLUCOSE-CAPILLARY: 214 mg/dL — AB (ref 65–99)
GLUCOSE-CAPILLARY: 221 mg/dL — AB (ref 65–99)
GLUCOSE-CAPILLARY: 252 mg/dL — AB (ref 65–99)
Glucose-Capillary: 213 mg/dL — ABNORMAL HIGH (ref 65–99)
Glucose-Capillary: 150 mg/dL — ABNORMAL HIGH (ref 65–99)

## 2015-10-11 LAB — PREPARE RBC (CROSSMATCH)

## 2015-10-11 MED ORDER — BISACODYL 10 MG RE SUPP: 10.0000 mg | Freq: Every day | RECTAL | Status: DC | PRN

## 2015-10-11 MED ORDER — WHITE PETROLATUM GEL
Status: AC
  Administered 2015-10-11: 0.2
  Filled 2015-10-11: qty 1

## 2015-10-11 MED ORDER — DOCUSATE SODIUM 50 MG/5ML PO LIQD
50.0000 mg | Freq: Two times a day (BID) | ORAL | Status: DC
  Administered 2015-10-11 – 2015-10-25 (×23): 50 mg via ORAL
  Filled 2015-10-11 (×28): qty 10

## 2015-10-11 MED ORDER — SODIUM CHLORIDE 0.9 % IV SOLN
Freq: Once | INTRAVENOUS | Status: AC
  Administered 2015-10-11: 12:00:00 via INTRAVENOUS

## 2015-10-11 MED ORDER — INSULIN GLARGINE 100 UNIT/ML ~~LOC~~ SOLN
30.0000 [IU] | Freq: Two times a day (BID) | SUBCUTANEOUS | Status: DC
  Administered 2015-10-11 – 2015-10-22 (×18): 30 [IU] via SUBCUTANEOUS
  Filled 2015-10-11 (×23): qty 0.3

## 2015-10-11 MED ORDER — FLEET ENEMA 7-19 GM/118ML RE ENEM
1.0000 | ENEMA | Freq: Once | RECTAL | Status: AC
  Administered 2015-10-11: 1 via RECTAL
  Filled 2015-10-11: qty 1

## 2015-10-11 NOTE — Progress Notes (Signed)
Asked to return to see patient regarding bleeding from PEG site. On examination the bleeding still appears old with expressible clot. Pt winces with too much manipulation of site. Her hgb progression is as below. She has been bleeding since 2/13 - 2/14. By hgb the bleeding appears to be acute. This is either coming from the subq or (less likely, I think) from a gastric ulcer. Will check a stool guaic; if positive she will need an upper endoscopy by GI to assess for ulcer. If subq bleeding can purse string suture around the PEG tube to let bleeding tamponade. Will have MD assess for final determination.   CBC Latest Ref Rng 10/11/2015 10/10/2015 10/09/2015  WBC 4.0 - 10.5 K/uL 13.9(H) 14.0(H) 14.8(H)  Hemoglobin 12.0 - 15.0 g/dL 6.8(LL) 7.0(L) 8.3(L)  Hematocrit 36.0 - 46.0 % 22.5(L) 23.3(L) 26.8(L)  Platelets 150 - 400 K/uL 360 376 PLATELET CLUMPS NOTED ON SMEAR, COUNT APPEARS ADEQUATE     Freeman Caldron, PA-C Pager: [phone] General Trauma PA Pager: [phone]

## 2015-10-11 NOTE — Progress Notes (Signed)
STROKE TEAM PROGRESS NOTE   SUBJECTIVE (INTERVAL HISTORY) No family is at the bedside. Still have low grade fever and tachycardia, but improving. Glucose still high, again increase lantus to 30units bid. Na 143 today. PEG tube site still bleeding with dropping Hb, requiring blood tranfusion, trauma team on board. ID consulted for continued bacteremia on vancomycin with fever. 10/10/15 BCx pending  OBJECTIVE Temp:  [98.3 F (36.8 C)-102.2 F (39 C)] 99 F (37.2 C) (02/15 1630) Pulse Rate:  [86-124] 93 (02/15 1630) Cardiac Rhythm:  [-] Sinus tachycardia (02/15 0800) Resp:  [0-59] 26 (02/15 1630) BP: (91-149)/(55-79) 105/58 mmHg (02/15 1630) SpO2:  [95 %-100 %] 100 % (02/15 1630) FiO2 (%):  [40 %] 40 % (02/15 1545) Weight:  [253 lb 15.5 oz (115.2 kg)] 253 lb 15.5 oz (115.2 kg) (02/15 0310)  CBC:   Recent Labs Lab 10/10/15 1205 10/11/15 0520  WBC 14.0* 13.9*  HGB 7.0* 6.8*  HCT 23.3* 22.5*  MCV 96.3 97.4  PLT 376 360    Basic Metabolic Panel:   Recent Labs Lab 10/05/15 0550  10/08/15 0400  10/10/15 1205 10/11/15 0520  NA 149*  < > 151*  < > 148* 143  K 4.2  < > 3.4*  < > 4.4 4.1  CL 115*  < > 120*  < > 115* 113*  CO2 22  < > 22  < > 22 23  GLUCOSE 215*  < > 229*  < > 288* 243*  BUN 72*  < > 45*  < > 54* 56*  CREATININE 1.65*  < > 1.18*  < > 1.40* 1.37*  CALCIUM 8.4*  < > 8.4*  < > 8.5* 8.1*  MG 2.3  --  2.3  --   --   --   PHOS 4.6  --  3.8  --   --   --   < > = values in this interval not displayed.  Lipid Panel:     Component Value Date/Time   CHOL 170 09/28/2015 0938   TRIG 245* 10/04/2015 0841   HDL 38* 09/28/2015 0938   CHOLHDL 4.5 09/28/2015 0938   VLDL UNABLE TO CALCULATE IF TRIGLYCERIDE OVER 400 mg/dL 65/78/4696 2952   LDLCALC UNABLE TO CALCULATE IF TRIGLYCERIDE OVER 400 mg/dL 84/13/2440 1027   [id number]:  Lab Results  Component Value Date   HGBA1C 6.9* 09/28/2015   Urine Drug Screen:     Component Value Date/Time   LABOPIA NONE DETECTED 09/28/2015  0226   LABBENZ NONE DETECTED 09/28/2015 0226   AMPHETMU NONE DETECTED 09/28/2015 0226   THCU NONE DETECTED 09/28/2015 0226   LABBARB NONE DETECTED 09/28/2015 0226      IMAGING I have personally reviewed the radiological images below and agree with the radiology interpretations.  Ct Head Wo Contrast 09/28/2015  1. New calcific density in the left sylvian fissure, possible calcific distal M2 embolus. No visible acute infarct or hemorrhage. 2. Advanced chronic small vessel disease with multiple lacunar infarcts.   CTA NECK 09/28/2015   Moderately motion degraded examination, limited by LEFT venous contamination. No definite acute vascular process or hemodynamically significant stenosis. 3.5 x 5.7 cm dominant LEFT thyroid not oral for which follow up thyroid sonogram is recommended on a nonemergent basis.   CTA HEAD 09/28/2015   LEFT M2 segment occlusion corresponding to sylvian fissure density on today's CT, likely acute. Complete circle of Willis.  Mild intracranial atherosclerosis. Moderate calcific atherosclerosis the carotid siphons.   Cerebral Angiogram 09/28/2015 S/P Lt internal  carotid arteriogram followed by complete revascularization of occluded dominant inferior division of LT MCA with x pass with trevoprovue 4 mmx 30 mm retrieval device and 6 mg of superselective Integrelin achieving a TICI 3 flow.  MRI  Large acute hemorrhagic left hemispheric infarct involves the majority of the left middle cerebral artery distribution. Hemorrhage is most notable involving the left posterior temporal -parietal aspect of this acute infarct. Local mass effect upon the left lateral ventricle. At most there is minimal bowing of the septum to the right. Several small acute nonhemorrhagic right hemispheric infarcts involving right caudate head, superior aspect of the post limb right internal capsule, right frontal lobe, superior right lenticular nucleus and right occipital lobe. Several small acute  nonhemorrhagic cerebellar infarcts greater on the right. Remote left lenticular nucleus infarct or hematoma with blood-stained cleft now noted. Moderate to marked chronic small vessel disease changes. Atrophy.  EEG Abnormalities: 1) possible attenuation of sleep structures in the left central region 2) lack of waking state Clinical Interpretation: This EEG is consistent with a sleep/sedation EEG with a possible suggestion of a left central cerebral dysfunction, though I don't think this is definite by this study.  2D echo - Systolic function was normal. The estimated ejection fraction was in the range of 55% to 60%. Wall motion was normal; there were no regional wall motion abnormalities. Doppler parameters are consistent with abnormal left ventricular relaxation (grade 1 diastolic dysfunction).  LE venous doppler -   no DVT   Dg Chest Port 1 View 10/08/2015  IMPRESSION: Atelectasis left mid lung. Lungs elsewhere clear. No change in cardiac silhouette. Tube and catheter positions as described without pneumothorax.   10/09/2015 IMPRESSION: Slight interval improvement in the pulmonary interstitium. Persistent subsegmental atelectasis in the left mid lung. No pleural effusion or pneumothorax. The support tubes are in reasonable position.    Physical exam  Temp:  [98.3 F (36.8 C)-102.2 F (39 C)] 99 F (37.2 C) (02/15 1630) Pulse Rate:  [86-124] 93 (02/15 1630) Resp:  [0-59] 26 (02/15 1630) BP: (91-149)/(55-79) 105/58 mmHg (02/15 1630) SpO2:  [95 %-100 %] 100 % (02/15 1630) FiO2 (%):  [40 %] 40 % (02/15 1545) Weight:  [253 lb 15.5 oz (115.2 kg)] 253 lb 15.5 oz (115.2 kg) (02/15 0310)  General - Well nourished, well developed, s/p trach and PEG.  Ophthalmologic - Fundi not visualized due to noncooperation.  Cardiovascular - Regular rhythm, but tachycardia.  Neruo - eyes open on pain stimulation, but nonverbal, still lethargic, s/p trach and peg, on ventilation. Not  following commands, but tracking objects with left gaze preference, doll's eye positive to both directions. Not blinking to visual threat bilaterally. PERRL. Right facial droop. tongue in middle inside mouth. Spontaneous movement LUE and LLE, on pain stimulation LUE against gravity but LLE not against gravity. RUE and RLE hemiplegia, with increased tone. DTR 1+, no babinski. Sensation, gait and coordination not tested.   ASSESSMENT/PLAN Linda [name] is a 60 y.o. female with history of left BG ICH in 2012, hypertension, cocaine abuse, renal failure and hepatitis C presenting with altered mental status, right side weakness and leftward gaze. Also concerning for possible seizure activity initial presentation. She did not receive IV t-PA due to hx ICH, CTA showed a L M2 occlusion, she was sent to IR where she received complete TICI3 revascularization of the L MCA with mechanical thrombectomy with trevo and IA Integrilin.  Stroke:  left MCA infarct with hemorrhagic transformation s/p revascularizationf of L M2,  multifocal scattered infarcts involving bilateral posterior and anterior circulation, consistent with cardioembolic source although pt does have significant large vessel intracranial atherosclerosis as well as cocaine abuse.  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  CTA neck L thyroid nodule  2D Echo  EF 55-60%  LE venous doppler negative for DVT  LDL unable to calculate due to high TG   HgbA1c 6.9  UDS positive for cocaine  Heparin subq for VTE prophylaxis Diet NPO time specified  No antithrombotic prior to admission,  now on ASA 325mg .   Ongoing aggressive stroke risk factor management  Therapy recommendations:  LTAC  Disposition:  pending   Bacteremia   Positive blood Cx 10/05/15 G+ in cluster with coagulase negative 2/2  Leukocytosis WBC 16.2->16.8->14.8 -> 14.0-> 13.9  Sensitive to vanco  Central line placed  ID recs appreciated  Will change IJ to PICC, CCM aware  Presumed endocarditis but hold off further work up due to limited functional improvement so far and likely poor prognosis  Anemia  Dropping Hb 8.3-6.8  S/p blood transfusion  Likely due to PEG tube site bleeding  Trauma Team on board, appreciate their help  Stool guaiac pending  Hx of ICH  2012 left BG ICH; during hospital course:  BP high at 200s  Urine positive with cocaine   S/p trach and PEG at that time  Lost follow up since  Acute respiratory failure  Intubated in ED due to airway protection  S/p trach, not able to wean off ventilation  On Risperdal and clonidine patch for agitation, scheduled ativan tapered off  Sputum culture continue to show MRSA 10/06/15  On vancomycin   Still has low grade fever and tachycardia   CCM following  DM  A1C 6.9 on admission  However, glucose high on CBG  High dose SSI  Increase lantus to 30 units bid  CBG monitoring Q6  ? Seizure  Questionable seizure presentation initially as reported in chart  EEG showed no seizure but under sedation  Hold off AEDs at this time  No seizure activity since admission  Hypernatremia  Na 152->151->149->148->143  continue free water   Hypertensive Emergency  BP as high as 202/124  off cardene drip  Stable   On clonidine patch  Hyperlipidemia  Home meds:  No statin  LDL not able to calculate due to high TG , goal < 70  Total chol 170  on lipitor 10mg   Dysphagia   Hx Dysphagia secondary to ICH in 2012 with PEG  S/p PEG  On TF  Bowel regimen  Cocaine abuse  urine positive both in  2012 and this admission  Likely one of the causes of intracranial vascular stenosis  Need cessation counseling   Other Stroke Risk Factors  Obesity  Other Active Problems  Hepatitis C   Hospital day # 13  This patient is critically ill due to large left MCA infarct with left M2 occlusion s/p intervention, hemorrhagic transformation, bacteremia, acute blood loss anemia, PEG tube site bleeding, constant low-grade fever and at significant risk of neurological worsening, death form recurrent infarcts, increased ICH, brain herniation, cerebral edema, heart failure and sepsis. This patient's care requires constant  monitoring of vital signs, hemodynamics, respiratory and cardiac monitoring, review of multiple databases, neurological assessment, discussion with family, other specialists and medical decision making of high complexity. I spent 50 minutes of neurocritical care time in the care of this patient.  Marvel Plan, MD PhD Stroke Neurology 10/11/2015 5:33 PM     To contact Stroke Continuity provider, please refer to [email or web address]. After hours, contact General Neurology

## 2015-10-11 NOTE — Progress Notes (Signed)
PULMONARY / CRITICAL CARE MEDICINE   Name: Linda [name] MRN: [id number] DOB: 01-30-1956    ADMISSION DATE:  09/28/2015 CONSULTATION DATE:  2/2  REFERRING MD:  Neuro  CHIEF COMPLAINT:  Obtunded   SUBJECTIVE:  Still febrile  VITAL SIGNS: BP 128/79 mmHg  Pulse 102  Temp(Src) 99.2 F (37.3 C) (Oral)  Resp 18  Ht  (1.702 m)  Wt 253 lb 15.5 oz (115.2 kg)  BMI 39.77 kg/m2  SpO2 99%  VENTILATOR SETTINGS: Vent Mode:  [-] PRVC FiO2 (%):  [40 %] 40 % Set Rate:  [20 bmp] 20 bmp Vt Set:  [450 mL-490 mL] 450 mL PEEP:  [5 cmH20] 5 cmH20 Plateau Pressure:  [11 cmH20-22 cmH20] 22 cmH20  INTAKE / OUTPUT: I/O last 3 completed shifts: In: 3440 [I.V.:10; Other:145; NG/GT:2835; IV Piggyback:450] Out: 2 [Other:2]  PHYSICAL EXAMINATION: General: on vent, grimaced to pain Neuro: opens eyes with stimulation, becomes agitated at times. Rt side flaccid HEENT: trach site clean Cardiac: RRR, Nl S1/S2, -M/R/G. Chest: Coarse BS bilaterally. No accessory muscle use  Abd: g tube site clean Ext: 1+ edema Skin: no rashes  LABS:  BMET  Recent Labs Lab 10/09/15 0445 10/10/15 1205 10/11/15 0520  NA 149* 148* 143  K 4.0 4.4 4.1  CL 119* 115* 113*  CO2 21* 22 23  BUN 44* 54* 56*  CREATININE 1.21* 1.40* 1.37*  GLUCOSE 297* 288* 243*   Electrolytes  Recent Labs Lab 10/05/15 0550  10/08/15 0400 10/09/15 0445 10/10/15 1205 10/11/15 0520  CALCIUM 8.4*  < > 8.4* 8.4* 8.5* 8.1*  MG 2.3  --  2.3  --   --   --   PHOS 4.6  --  3.8  --   --   --   < > = values in this interval not displayed. CBC CBC Latest Ref Rng 10/11/2015 10/10/2015 10/09/2015  WBC 4.0 - 10.5 K/uL 13.9(H) 14.0(H) 14.8(H)  Hemoglobin 12.0 - 15.0 g/dL 6.8(LL) 7.0(L) 8.3(L)  Hematocrit 36.0 - 46.0 % 22.5(L) 23.3(L) 26.8(L)  Platelets 150 - 400 K/uL 360 376 PLATELET CLUMPS NOTED ON SMEAR, COUNT APPEARS ADEQUATE    Coag's No results for input(s): APTT, INR in the last 168 hours. Sepsis Markers  Recent  Labs Lab 10/06/15 1223 10/07/15 0430 10/08/15 0400  PROCALCITON 0.37 0.33 0.25   ABG  Recent Labs Lab 10/05/15 0424  PHART 7.422  PCO2ART 29.9*  PO2ART 101*   Glucose  Recent Labs Lab 10/10/15 1213 10/10/15 1725 10/10/15 2001 10/10/15 2357 10/11/15 0450 10/11/15 0858  GLUCAP 244* 253* 225* 214* 221* 189*   Imaging  I reviewed CXR myself, trach in good position.  No results found. STUDIES:  EEG 2/02 >> consistent with a sleep/sedation EEG with a possible suggestion of a left central cerebral dysfunction Echo 2/03 >> EF 55 to 60%, grade 1 diastolic dysfx Doppler legs b/l 2/04 >> no DVT  CULTURES: 2/2 Blood >> negative 2/2 Urine >> negative 2/2 Sputum >> oral flora 2/4 Sputum >> MRSA 2/4 Blood >> Coag neg Staph 2/9 Blood >>GPC>> 2/10 Sputum >>MRSA 2/12 repeat culture MRSA  ANTIBIOTICS: 2/7 Vancomycin >>  2/10 Fortaz >> 2/12  SIGNIFICANT EVENTS: 2/2 Admit, neuro IR >> revascularization of Lt MCA inferior division 2/7 Trach, G tube  LINES/TUBES: 2/02 ETT >> 2/07 2/07 Trach (JY) >>  2/07 Gtube (CCS) >> 2/10 Rt IJ CVL >>  I reviewed CXR myself, trach in good position, infiltrate noted.  DISCUSSION: 60 yo female with Lt MCA CVA,  VDRF.  UDS positive for cocaine.  Failed to wean from vent due to mental status, and required tracheostomy.  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  ASSESSMENT / PLAN:  NEUROLOGY A: Acute encephalopathy 2nd to Lt MCA CVA and cocaine. P: Continue ASA Continue clonidne for agitation and substance abuse. May need to bump up risperdal.   PULMONARY A: Compromised airway in setting of CVA. Failure to wean from vent s/p tracheostomy 2/07. P:   Pressure support wean as able. Needs vent SNF. F/u CXR PRN  CARDIOVASCULAR A:  Hx of HTN, exacerbated by cocaine use.  P:  Goal SBP < 160   RENAL A:   CKD stage 2 >> baseline renal fx 1.27 from  02/28/14. Hypernatremia. P:   Monitor renal fx, urine outpt Increased free water, f/u BMET Treat glucose more aggressively  Will need diureses once sodium improves.  GASTROINTESTINAL A:   Dysphagia s/p G tube 2/07. P:   Tube feeds Protonix for SUP  HEMATOLOGIC A:   Anemia of critical illness.  P:  F/u CBC intermittently SQ heparin, SCD's for DVT prevention Transfuse   INFECTIOUS A:   Persistent Fever  MRSA HCAP Coag neg Staph bacteremia-->worrisome for native valve endocarditis-->last ECHO neg P:   Day 8 of vancomycin Repeated blood cx 2/09>>GPC>>coag neg Repeat sputum cx 2/10: MRSA Procalcitonin 0.33 2/11 Add fortaz 2/10>>dc Further recs per ID-->agree needs line holiday   ENDOCRINE   A:   DM type II. P:   SSI Cont lantus   Disposition >> will likely need Vent/snf  10/11/2015 9:57 AM  Simonne Martinet ACNP-BC [facility] Pulmonary/Critical Care Pager # [phone] OR # [phone] if no answer  Attending Note:  60 year old female with a second cocaine induced stroke, now completely unresponsive, family continues to wish for full support.  She is not weanable.  On exam, RR in the 40 on 5/5.  Needs a vent-SNF.  I reviewed CXR myself, infiltrate noted and trach ok.  Discussed with bedside RN and PCCM-NP and RT regarding plan of care.  Respiratory failure:  - Continue weaning efforts.  - Recommend vent SNF, not weanable.  - Titrate O2 for sat of 88-92%.  Tracheostomy status:  - Do not downsize or replace.  - May remove sutures.  Persistent MRSA infection:  - D/C TLC.  - Continue vanc.  - Appreciate input from ID.  Patient seen and examined, agree with above note.  I dictated the care and orders written for this patient under my direction.  Alyson Reedy, MD [phone]

## 2015-10-11 NOTE — Consult Note (Addendum)
WOC followup: Trach sutures have been removed at this time, revealing full thickness wounds under the faceplate bilaterally.  Discussed plan of care with bedside nurse now that a dressing is able to be applied over sites.  Aquacel to absorb drainage and provide antimicrobial benefits, and Tegaderm to protect from moisture and promote healing.  Please re-consult if further assistance is needed.  Thank-you,  Cammie Mcgee MSN, RN, CWOCN, [place], CNS [phone]

## 2015-10-11 NOTE — Progress Notes (Signed)
[facility]    Date of Admission:  09/28/2015   Total days of antibiotics 9        Day 9 vancomycin          ID: Linda [name] is a 60 y.o. female with CVA with significant neurologic insult/obtunded s/p trach s/p peg being treated for MRSA pneumonia and CoNS bacteremia with persistent fevers Principal Problem:   CVA (cerebral infarction) Active Problems:   HTN (hypertension)   Cocaine abuse   Acute respiratory failure with hypoxia (HCC)   Stroke (cerebrum) (HCC)   Fever, unspecified   Leukocytosis   Ischemic stroke (HCC)   Respiratory failure (HCC)   Cytotoxic brain edema (HCC)   Cerebrovascular accident (CVA) due to thrombosis of cerebral artery (HCC)   Cerebrovascular accident (CVA) due to occlusion of cerebral artery (HCC)   Coarctation of aorta, recurrent, post-intervention   Pressure ulcer   Tracheostomy care (HCC)   Ventilator dependence (HCC)   Bacteremia    Subjective: tmax of 102 yesterday, but afebrile this morning. RT reports seeing her involuntarily move left arm  Interval hx: had suture removed from trach that enabled full visualization of full thickness ulcer. Wound recs given. Still had bleeding near PEG from associated hematoma last night  Medications:  .  stroke: mapping our early stages of recovery book   Does not apply Once  . antiseptic oral rinse  7 mL Mouth Rinse 10 times per day  . aspirin  325 mg Per Tube Daily  . atorvastatin  10 mg Oral q1800  . chlorhexidine gluconate  15 mL Mouth Rinse BID  . cloNIDine  0.1 mg Transdermal Weekly  . feeding supplement (PRO-STAT SUGAR FREE 64)  30 mL Per Tube BID  . free water  300 mL Per Tube Q4H  . heparin subcutaneous  5,000 Units Subcutaneous 3 times per day  . insulin aspart  0-20 Units Subcutaneous 6 times per day  . insulin glargine  25 Units Subcutaneous BID  . multivitamin with minerals  1 tablet Per Tube Daily  . pantoprazole sodium  40 mg Per Tube QHS  . risperiDONE   2 mg Oral Q12H  . sodium chloride flush  10-40 mL Intracatheter Q12H  . vancomycin  750 mg Intravenous Q12H    Objective: Vital signs in last 24 hours: Temp:  [98.3 F (36.8 C)-102.2 F (39 C)] 99.2 F (37.3 C) (02/15 1217) Pulse Rate:  [90-124] 94 (02/15 1217) Resp:  [0-59] 21 (02/15 1217) BP: (105-149)/(55-79) 105/55 mmHg (02/15 1217) SpO2:  [95 %-100 %] 100 % (02/15 1217) FiO2 (%):  [40 %] 40 % (02/15 1200) Weight:  [253 lb 15.5 oz (115.2 kg)] 253 lb 15.5 oz (115.2 kg) (02/15 0310) Physical Exam  Constitutional:  Obtunded, eyes closed. appears well-developed and well-nourished. No distress.  HENT: [place]/AT, , no scleral icterus Mouth/Throat: Oropharynx is clear and moist. No oropharyngeal exudate.  Cardiovascular: Normal rate, regular rhythm and normal heart sounds. Exam reveals no gallop and no friction rub.  No murmur heard.  Pulmonary/Chest: Effort normal and breath sounds normal. No respiratory distress.  has no wheezes.  Neck = supple, shallow ulcer inferior aspect of tracheostomy with fibrinous debris in central portion of woundbed Abdominal: Soft. Bowel sounds are normal.  exhibits no distension. There is no tenderness. +peg Lymphadenopathy: no cervical adenopathy. No axillary adenopathy Neurological: winced to irritation/pain stimuli (when tracheostomy band was being repositioned aroundher neck) Skin: Skin is warm and dry. No rash  noted. No erythema.  Psychiatric: a normal mood and affect.  behavior is normal.    Lab Results  Recent Labs  10/10/15 1205 10/11/15 0520  WBC 14.0* 13.9*  HGB 7.0* 6.8*  HCT 23.3* 22.5*  NA 148* 143  K 4.4 4.1  CL 115* 113*  CO2 22 23  BUN 54* 56*  CREATININE 1.40* 1.37*   Microbiology: 2/14 blood cx pending 2/10 resp cx mrsa 2/09 blood cx CoNS 2/4 blood cx CoNS Studies/Results: No results found.   Assessment/Plan: Fevers = possibly due to bacteremia vs. Pneumonia vs. Central fevers. Somewhat favor the latter since she has  been on abtx for the past 9 days without change to her fever curve. She had been febrile since admit on 2/2.  mrsa trachiitis/pneumonia = would plan to treat for 14 days (use end date for bacteremia tx)  CoNS bacteremia, complicated = would treat as presumed endocarditis due to having recurrent bacteremia. Patient is poor candidate for TEE nor surgery since she is obtunded. Would treat for a total of 6 wk, using 2/14 as day 1 of 42. Recommend line holiday, and switching IJ to PICC. If blood cultures are positive from 2/14, will need to do further imagige to find source.  Pressure ulcer at trach site= appreciate wound care recs.  Will sign off. Call if further questions  Drue Second [facility] Infect[facility] Cell: [phone] Pager: [phone]  10/11/2015, 12:45 PM

## 2015-10-11 NOTE — Progress Notes (Signed)
  Linda [name], [place]- [phone]- has 8 people on wait list for Medicaid placement  [facility] [place], [place] [phone]- reviewing referral- availability pending possible DC  [facility], [place], [place] [phone]- awaiting response- referral received  Merlyn Lot, [facility] Clinical Social Worker [phone]

## 2015-10-11 NOTE — Progress Notes (Signed)
Trach Team Patient Details Name: Linda [name] MRN: [id number] DOB: 1955-10-30 Today's Date: 10/11/2015 Time:  -      Reviewed chart per trach team. Pt on ventilator. Per RN pt is not alert and does not follow commands. She would not be able a candidate for in-line vent (unable to provide needed feedback during assessment). If pt becomes alert, please consider ST consult for speaking valve.    Breck Coons [place].Ed [employer] [phone]

## 2015-10-11 NOTE — Progress Notes (Signed)
CRITICAL VALUE ALERT  Critical value received:  Hg 6.8  Date of notification:  10/11/15  Time of notification:  0629  Critical value read back:Yes.    Nurse who received alert:  Megan Salon RN  MD notified (1st page):  Trauma Doctor Magnus Ivan Paged  Time of first page:  0630  MD notified (2nd page):  Time of second page:  Responding MD:    Time MD responded:

## 2015-10-12 ENCOUNTER — Inpatient Hospital Stay (HOSPITAL_COMMUNITY): Payer: Medicaid Other

## 2015-10-12 LAB — GLUCOSE, CAPILLARY
GLUCOSE-CAPILLARY: 168 mg/dL — AB (ref 65–99)
GLUCOSE-CAPILLARY: 160 mg/dL — AB (ref 65–99)
GLUCOSE-CAPILLARY: 155 mg/dL — AB (ref 65–99)
Glucose-Capillary: 152 mg/dL — ABNORMAL HIGH (ref 65–99)
Glucose-Capillary: 148 mg/dL — ABNORMAL HIGH (ref 65–99)
Glucose-Capillary: 141 mg/dL — ABNORMAL HIGH (ref 65–99)

## 2015-10-12 LAB — OCCULT BLOOD X 1 CARD TO LAB, STOOL: Fecal Occult Bld: NEGATIVE

## 2015-10-12 LAB — TYPE AND SCREEN
ABO/RH(D): O POS
ANTIBODY SCREEN: NEGATIVE
Unit division: 0

## 2015-10-12 LAB — COMPREHENSIVE METABOLIC PANEL
ALT: 17 U/L (ref 14–54)
ANION GAP: 10 (ref 5–15)
AST: 31 U/L (ref 15–41)
Albumin: 1.9 g/dL — ABNORMAL LOW (ref 3.5–5.0)
Alkaline Phosphatase: 45 U/L (ref 38–126)
BUN: 49 mg/dL — ABNORMAL HIGH (ref 6–20)
CHLORIDE: 110 mmol/L (ref 101–111)
CO2: 23 mmol/L (ref 22–32)
CREATININE: 1.21 mg/dL — AB (ref 0.44–1.00)
Calcium: 8.5 mg/dL — ABNORMAL LOW (ref 8.9–10.3)
GFR, EST AFRICAN AMERICAN: 55 mL/min — AB (ref >60)
GFR, EST NON AFRICAN AMERICAN: 48 mL/min — AB (ref >60)
Glucose, Bld: 174 mg/dL — ABNORMAL HIGH (ref 65–99)
POTASSIUM: 4.7 mmol/L (ref 3.5–5.1)
SODIUM: 143 mmol/L (ref 135–145)
Total Bilirubin: 0.8 mg/dL (ref 0.3–1.2)
Total Protein: 5.6 g/dL — ABNORMAL LOW (ref 6.5–8.1)

## 2015-10-12 LAB — URINALYSIS, ROUTINE W REFLEX MICROSCOPIC
BILIRUBIN URINE: NEGATIVE
Glucose, UA: NEGATIVE mg/dL
HGB URINE DIPSTICK: NEGATIVE
KETONES UR: NEGATIVE mg/dL
Leukocytes, UA: NEGATIVE
Nitrite: NEGATIVE
Protein, ur: NEGATIVE mg/dL
SPECIFIC GRAVITY, URINE: 1.017 (ref 1.005–1.030)
pH: 5 (ref 5.0–8.0)

## 2015-10-12 LAB — CBC
HCT: 27.5 % — ABNORMAL LOW (ref 36.0–46.0)
Hemoglobin: 8.8 g/dL — ABNORMAL LOW (ref 12.0–15.0)
MCH: 30 pg (ref 26.0–34.0)
MCHC: 32 g/dL (ref 30.0–36.0)
MCV: 93.9 fL (ref 78.0–100.0)
PLATELETS: 334 10*3/uL (ref 150–400)
RBC: 2.93 MIL/uL — AB (ref 3.87–5.11)
RDW: 14.7 % (ref 11.5–15.5)
WBC: 16.7 10*3/uL — ABNORMAL HIGH (ref 4.0–10.5)

## 2015-10-12 MED ORDER — IOHEXOL 300 MG/ML  SOLN
25.0000 mL | INTRAMUSCULAR | Status: AC
  Administered 2015-10-12 (×2): 25 mL via ORAL

## 2015-10-12 NOTE — Progress Notes (Signed)
PULMONARY / CRITICAL CARE MEDICINE   Name: Linda [name] MRN: [id number] DOB: 08/25/1956    ADMISSION DATE:  09/28/2015 CONSULTATION DATE:  2/2  REFERRING MD:  Neuro  CHIEF COMPLAINT:  Obtunded   SUBJECTIVE:  Still febrile  VITAL SIGNS: BP 122/62 mmHg  Pulse 105  Temp(Src) 101.3 F (38.5 C) (Axillary)  Resp 14  Ht  (1.702 m)  Wt 116.8 kg (257 lb 8 oz)  BMI 40.32 kg/m2  SpO2 98%  VENTILATOR SETTINGS: Vent Mode:  [-] PRVC FiO2 (%):  [30 %-40 %] 30 % Set Rate:  [20 bmp] 20 bmp Vt Set:  [450 mL] 450 mL PEEP:  [5 cmH20] 5 cmH20 Plateau Pressure:  [11 cmH20-18 cmH20] 15 cmH20  INTAKE / OUTPUT: I/O last 3 completed shifts: In: 5753.3 [I.V.:60; Blood:363.3; Other:845; NG/GT:4035; IV Piggyback:450] Out: 3 [Other:3]  PHYSICAL EXAMINATION: General: on vent, grimaced to pain Neuro: opens eyes with stimulation, becomes agitated at times. Rt side flaccid HEENT: trach site clean Cardiac: RRR, Nl S1/S2, -M/R/G. Chest: Coarse BS bilaterally. No accessory muscle use  Abd: g tube site clean Ext: 1+ edema Skin: no rashes  LABS:  BMET  Recent Labs Lab 10/10/15 1205 10/11/15 0520 10/12/15 0240  NA 148* 143 143  K 4.4 4.1 4.7  CL 115* 113* 110  CO2 BUN 54* 56* 49*  CREATININE 1.40* 1.37* 1.21*  GLUCOSE 288* 243* 174*   Electrolytes  Recent Labs Lab 10/08/15 0400  10/10/15 1205 10/11/15 0520 10/12/15 0240  CALCIUM 8.4*  < > 8.5* 8.1* 8.5*  MG 2.3  --   --   --   --   PHOS 3.8  --   --   --   --   < > = values in this interval not displayed. CBC CBC Latest Ref Rng 10/12/2015 10/11/2015 10/11/2015  WBC 4.0 - 10.5 K/uL 16.7(H) 16.5(H) 13.9(H)  Hemoglobin 12.0 - 15.0 g/dL 9.1(Y) 8.3(L) 6.8(LL)  Hematocrit 36.0 - 46.0 % 27.5(L) 25.6(L) 22.5(L)  Platelets 150 - 400 K/uL 334 356 360    Coag's No results for input(s): APTT, INR in the last 168 hours. Sepsis Markers  Recent Labs Lab 10/06/15 1223 10/07/15 0430 10/08/15 0400  PROCALCITON 0.37  0.33 0.25   ABG No results for input(s): PHART, PCO2ART, PO2ART in the last 168 hours. Glucose  Recent Labs Lab 10/11/15 1219 10/11/15 1646 10/11/15 2226 10/12/15 0355 10/12/15 0648 10/12/15 0757  GLUCAP 252* 213* 150* 168* 160* 152*   Imaging  I reviewed CXR myself, trach in good position.  Ct Abdomen Pelvis Wo Contrast  10/12/2015  CLINICAL DATA:  Bleeding and gastrostomy tube site EXAM: CT ABDOMEN AND PELVIS WITHOUT CONTRAST TECHNIQUE: Multidetector CT imaging of the abdomen and pelvis was performed following the standard protocol without IV contrast. COMPARISON:  None. FINDINGS: Lungs are very under aerated. Three vessel coronary artery calcifications are noted. Small scattered mediastinal nodes are present. Bibasilar dependent atelectasis versus airspace disease. No pleural effusion. The gastrostomy tube tip is positioned in the body of the stomach. It is tethered to the anterior wall. There is stranding and soft tissue density along the gastrostomy tube tract consistent with minimal subcutaneous hemorrhage. There is a small amount of hemorrhage within the musculature. High-density material is layering in the gallbladder worrisome for milk-of-calcium bile or tiny gallstones Unenhanced liver, spleen, pancreas, adrenal glands, and kidneys are within normal limits. Diffuse intra-abdominal and subcutaneous fat edema. Atherosclerotic vascular calcifications are noted. Normal appendix. Uterus is lobulated.  Posterior sub serosal fundal fibroid is suspected. Unremarkable bladder. No free-fluid.  No abnormal retroperitoneal adenopathy. Advanced disc space narrowing and vacuum at L4-5. IMPRESSION: The gastrostomy tube is appropriately positioned and there is a very small amount hemorrhage within the rectus musculature and subcutaneous fat surrounding the tube insertion site. In the lungs, bibasilar atelectasis versus airspace disease is present. Milk-of-calcium bile versus gallstones.  Ultrasound may  be helpful. Electronically Signed   By: Jolaine Click M.D.   On: 10/12/2015 09:32   STUDIES:  EEG 2/02 >> consistent with a sleep/sedation EEG with a possible suggestion of a left central cerebral dysfunction Echo 2/03 >> EF 55 to 60%, grade 1 diastolic dysfx Doppler legs b/l 2/04 >> no DVT  CULTURES: 2/2 Blood >> negative 2/2 Urine >> negative 2/2 Sputum >> oral flora 2/4 Sputum >> MRSA 2/4 Blood >> Coag neg Staph 2/9 Blood >>GPC>> 2/10 Sputum >>MRSA 2/12 repeat culture MRSA  ANTIBIOTICS: 2/7 Vancomycin >>  2/10 Fortaz >> 2/12  SIGNIFICANT EVENTS: 2/2 Admit, neuro IR >> revascularization of Lt MCA inferior division 2/7 Trach, G tube  LINES/TUBES: 2/02 ETT >> 2/07 2/07 Trach (JY) >>  2/07 Gtube (CCS) >> 2/10 Rt IJ CVL >>  I reviewed CXR myself, trach in good position, infiltrate noted.  DISCUSSION: 60 yo female with Lt MCA CVA, VDRF.  UDS positive for cocaine.  Failed to wean from vent due to mental status, and required tracheostomy.  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  ASSESSMENT / PLAN:  NEUROLOGY A: Acute encephalopathy 2nd to Lt MCA CVA and cocaine. P: Continue ASA Continue clonidne for agitation and substance abuse. May need to bump up risperdal.   PULMONARY A: Compromised airway in setting of CVA. Failure to wean from vent s/p tracheostomy 2/07. P:   Pressure support wean as able. Needs vent SNF. F/u CXR PRN  CARDIOVASCULAR A:  Hx of HTN, exacerbated by cocaine use.  P:  Goal SBP < 160   RENAL A:   CKD stage 2 >> baseline renal fx 1.27 from 02/28/14. Hypernatremia. P:   Monitor renal fx, urine outpt Increased free water, f/u BMET Treat glucose more aggressively  Will need diureses once sodium improves.  GASTROINTESTINAL A:   Dysphagia s/p G tube 2/07. P:   Tube feeds Protonix for SUP  HEMATOLOGIC A:   Anemia of critical illness.  P:  F/u CBC intermittently SQ heparin,  SCD's for DVT prevention Transfuse   INFECTIOUS A:   Persistent Fever  MRSA HCAP Coag neg Staph bacteremia-->worrisome for native valve endocarditis-->last ECHO neg P:   Day 9 of vancomycin Repeated blood cx 2/09>>GPC>>coag neg Repeat sputum cx 2/10: MRSA Procalcitonin 0.33 2/11 Add fortaz 2/10>>dc Further recs per ID-->agree needs line holiday   ENDOCRINE   A:   DM type II. P:   SSI Cont lantus   Disposition >> will likely need Vent/snf  10/12/2015 33:61 PM  60 year old female with a second cocaine induced stroke, now completely unresponsive, family continues to wish for full support.  She is not weanable.  On exam, RR in the 40 on 5/5.  Needs a vent-SNF.  I reviewed CXR myself, infiltrate noted and trach ok.  Discussed with bedside RN and PCCM-NP and RT regarding plan of care.  Respiratory failure:  - Continue weaning efforts.  - Recommend vent SNF, not weanable.  - Titrate O2 for sat of 88-92%.  Tracheostomy status:  -  Do not downsize or replace.  - May remove sutures.  Persistent MRSA infection:  - D/C TLC.  - Continue vanc.  - Appreciate input from ID.  Alyson Reedy, M.D. [facility] Pulmonary/Critical Care Medicine. Pager: [phone]. After hours pager: [phone].

## 2015-10-12 NOTE — Progress Notes (Signed)
Pharmacy Antibiotic Note  Linda [name] is a 60 y.o. female on vancomycin for CoNS bacteremia and MRSA pneumonia.   Now day #3/42 of abx for CONS bacteremia, MRSA PNA. Ceftaz added for continued fevers but stopped on 2/12. ID recommends treating for presumed endocarditis for 6 weeks from 2/14 if blood cx remains negative. Afebrile today but tmax of 101.7 yesterday, WBC at 16.7  Plan: Continue vancomycin  IV Q12 Monitor clinical picture, renal function, VT prn F/U C&S, abx deescalation / LOT  Consider next VT soon to make sure not accumulating  Height:  (170.2 cm) Weight: 257 lb 8 oz (116.8 kg) IBW/kg (Calculated) : 61.6  Temp (24hrs), Avg:99.4 F (37.4 C), Min:98.1 F (36.7 C), Max:101.7 F (38.7 C)   Recent Labs Lab 10/07/15 1728 10/08/15 0400 10/09/15 0445 10/10/15 1205 10/10/15 1245 10/11/15 0520 10/11/15 1729 10/12/15 0240  WBC  --  16.8* 14.8* 14.0*  --  13.9* 16.5* 16.7*  CREATININE  --  1.18* 1.21* 1.40*  --  1.37*  --  1.21*  VANCOTROUGH 13  --   --   --  21*  --   --   --     Estimated Creatinine Clearance: 65.3 mL/min (by C-G formula based on Cr of 1.21).    No Known Allergies  Antimicrobials this admission: Vancomycin 2/7 >> Ceftazidime 2/10 >> 2/12  Dose adjustments this admission: 2/11: VT = 13 on 1250 mg q24h 2/14: VT = 21 on  IV Q12 (Drawn ~1hr early)  Microbiology results: 2/14 Blood - ngtd 2/10 TA - MRSA 2/9 Blood - NGTD 2/4 Blood 2/2 CoNS 2/4 TA - MRSA 2/2 Blood -NEG 2/2 TA - NEG 2/2 Urine - NEG 2/2 MRSA - NEG  Enzo Bi, PharmD, BCPS Clinical Pharmacist Pager [phone] 10/12/2015 9:54 AM

## 2015-10-12 NOTE — Progress Notes (Signed)
Patient ID: Linda [name], female   DOB: 04/21/1956, 60 y.o.   MRN: [id number] CT reviewed - PEG in good position. There is a small L rectus hematoma at the site - this is now spontaneously draining. Continue to change dressing as needed. Violeta Gelinas, MD, MPH, FACS Trauma: [phone] General Surgery: [phone]

## 2015-10-12 NOTE — Progress Notes (Signed)
Pt transproted to and from CT without incident.  Pt suctioned prior to and after transport.

## 2015-10-12 NOTE — Progress Notes (Signed)
STROKE TEAM PROGRESS NOTE   SUBJECTIVE (INTERVAL HISTORY) No family is at the bedside. Still have low grade fever and tachycardia. Glucose much better, again increase lantus to 30units bid. Na 143 today. Still required sedation with ativan and fentanyl PRN for agitation over night.   OBJECTIVE Temp:  [98.1 F (36.7 C)-101.7 F (38.7 C)] 100.6 F (38.1 C) (02/16 1343) Pulse Rate:  [85-114] 112 (02/16 1343) Cardiac Rhythm:  [-] Sinus tachycardia (02/16 0745) Resp:  [0-42] 10 (02/16 1343) BP: (91-143)/(51-101) 122/62 mmHg (02/16 1200) SpO2:  [98 %-100 %] 99 % (02/16 1343) FiO2 (%):  [30 %-40 %] 30 % (02/16 1118) Weight:  [257 lb 8 oz (116.8 kg)] 257 lb 8 oz (116.8 kg) (02/16 0327)  CBC:   Recent Labs Lab 10/11/15 1729 10/12/15 0240  WBC 16.5* 16.7*  HGB 8.3* 8.8*  HCT 25.6* 27.5*  MCV 96.6 93.9  PLT 356 334    Basic Metabolic Panel:   Recent Labs Lab 10/08/15 0400  10/11/15 0520 10/12/15 0240  NA 151*  < > 143 143  K 3.4*  < > 4.1 4.7  CL 120*  < > 113* 110  CO2 22  < > 23 23  GLUCOSE 229*  < > 243* 174*  BUN 45*  < > 56* 49*  CREATININE 1.18*  < > 1.37* 1.21*  CALCIUM 8.4*  < > 8.1* 8.5*  MG 2.3  --   --   --   PHOS 3.8  --   --   --   < > = values in this interval not displayed.  Lipid Panel:     Component Value Date/Time   CHOL 170 09/28/2015 0938   TRIG 245* 10/04/2015 0841   HDL 38* 09/28/2015 0938   CHOLHDL 4.5 09/28/2015 0938   VLDL UNABLE TO CALCULATE IF TRIGLYCERIDE OVER 400 mg/dL 40/98/1191 4782   LDLCALC UNABLE TO CALCULATE IF TRIGLYCERIDE OVER 400 mg/dL 95/62/1308 6578   IONG2X:  Lab Results  Component Value Date   HGBA1C 6.9* 09/28/2015   Urine Drug Screen:     Component Value Date/Time   LABOPIA NONE DETECTED 09/28/2015 0226   LABBENZ NONE DETECTED 09/28/2015 0226   AMPHETMU NONE DETECTED 09/28/2015 0226   THCU NONE DETECTED 09/28/2015 0226   LABBARB NONE DETECTED 09/28/2015 0226      IMAGING I have personally reviewed the  radiological images below and agree with the radiology interpretations.  Ct Head Wo Contrast 09/28/2015  1. New calcific density in the left sylvian fissure, possible calcific distal M2 embolus. No visible acute infarct or hemorrhage. 2. Advanced chronic small vessel disease with multiple lacunar infarcts.   CTA NECK 09/28/2015   Moderately motion degraded examination, limited by LEFT venous contamination. No definite acute vascular process or hemodynamically significant stenosis. 3.5 x 5.7 cm dominant LEFT thyroid not oral for which follow up thyroid sonogram is recommended on a nonemergent basis.   CTA HEAD 09/28/2015   LEFT M2 segment occlusion corresponding to sylvian fissure density on today's CT, likely acute. Complete circle of Willis.  Mild intracranial atherosclerosis. Moderate calcific atherosclerosis the carotid siphons.   Cerebral Angiogram 09/28/2015 S/P Lt internal carotid arteriogram followed by complete revascularization of occluded dominant inferior division of LT MCA with x pass with trevoprovue 4 mmx 30 mm retrieval device and 6 mg of superselective Integrelin achieving a TICI 3 flow.  MRI  Large acute hemorrhagic left hemispheric infarct involves the majority of the left middle cerebral artery distribution. Hemorrhage is most  notable involving the left posterior temporal -parietal aspect of this acute infarct. Local mass effect upon the left lateral ventricle. At most there is minimal bowing of the septum to the right. Several small acute nonhemorrhagic right hemispheric infarcts involving right caudate head, superior aspect of the post limb right internal capsule, right frontal lobe, superior right lenticular nucleus and right occipital lobe. Several small acute nonhemorrhagic cerebellar infarcts greater on the right. Remote left lenticular nucleus infarct or hematoma with blood-stained cleft now noted. Moderate to marked chronic small vessel disease  changes. Atrophy.  EEG Abnormalities: 1) possible attenuation of sleep structures in the left central region 2) lack of waking state Clinical Interpretation: This EEG is consistent with a sleep/sedation EEG with a possible suggestion of a left central cerebral dysfunction, though I don't think this is definite by this study.  2D echo - Systolic function was normal. The estimated ejection fraction was in the range of 55% to 60%. Wall motion was normal; there were no regional wall motion abnormalities. Doppler parameters are consistent with abnormal left ventricular relaxation (grade 1 diastolic dysfunction).  LE venous doppler -   no DVT   Dg Chest Port 1 View 10/08/2015  IMPRESSION: Atelectasis left mid lung. Lungs elsewhere clear. No change in cardiac silhouette. Tube and catheter positions as described without pneumothorax.   10/09/2015 IMPRESSION: Slight interval improvement in the pulmonary interstitium. Persistent subsegmental atelectasis in the left mid lung. No pleural effusion or pneumothorax. The support tubes are in reasonable position.   Ct Abdomen Pelvis Wo Contrast 10/12/2015  IMPRESSION: The gastrostomy tube is appropriately positioned and there is a very small amount hemorrhage within the rectus musculature and subcutaneous fat surrounding the tube insertion site. In the lungs, bibasilar atelectasis versus airspace disease is present. Milk-of-calcium bile versus gallstones.  Ultrasound may be helpful.    Physical exam  Temp:  [98.1 F (36.7 C)-101.7 F (38.7 C)] 100.6 F (38.1 C) (02/16 1343) Pulse Rate:  [85-114] 112 (02/16 1343) Resp:  [0-42] 10 (02/16 1343) BP: (91-143)/(51-101) 122/62 mmHg (02/16 1200) SpO2:  [98 %-100 %] 99 % (02/16 1343) FiO2 (%):  [30 %-40 %] 30 % (02/16 1118) Weight:  [257 lb 8 oz (116.8 kg)] 257 lb 8 oz (116.8 kg) (02/16 0327)  General - Well nourished, well developed, s/p trach and PEG.  Ophthalmologic - Fundi not visualized due to  noncooperation.  Cardiovascular - Regular rhythm, but tachycardia.  Neruo - drowsy, sleepy, received ativan and fentanyl overnight. Eyes closed even with pain stimulation, nonverbal, s/p trach and peg, on ventilation. Not following any commands. PERRL, doll's eye positive to both directions. Positive corneal and gag. Right facial droop. tongue in middle inside mouth. Spontaneous movement LUE and LLE on pain stimulation. RUE and RLE hemiplegia, with increased tone. DTR 1+, no babinski. Sensation, gait and coordination not tested.   ASSESSMENT/PLAN [name] is a 60 y.o. female with history of left BG ICH in 2012, hypertension, cocaine abuse, renal failure and hepatitis C presenting with altered mental status, right side weakness and leftward gaze. Also concerning for possible seizure activity initial presentation. She did not receive IV t-PA due to hx ICH, CTA showed a L M2 occlusion, she was sent to IR where she received complete TICI3 revascularization of the L MCA with mechanical thrombectomy with trevo and IA Integrilin.  Stroke:  left MCA infarct with hemorrhagic transformation s/p revascularizationf of L M2, multifocal scattered infarcts involving bilateral posterior and anterior circulation, consistent with cardioembolic source  although pt does have significant large vessel intracranial atherosclerosis as well as cocaine abuse.  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  CTA neck L thyroid nodule  2D Echo  EF 55-60%  LE venous doppler negative for DVT  LDL unable to calculate due to high TG   HgbA1c 6.9  UDS positive  for cocaine  Heparin subq for VTE prophylaxis Diet NPO time specified  No antithrombotic prior to admission, now on ASA 325mg .   Ongoing aggressive stroke risk factor management  Therapy recommendations:  LTAC  Disposition:  pending   Palliative care consult for goal of care  Bacteremia   Positive blood Cx 10/05/15 G+ in cluster with coagulase negative 2/2  Leukocytosis WBC 16.2->16.8->14.8 -> 14.0-> 13.9->16.5->16.7  Sensitive to vanco  Central line placed  ID recs appreciated, recommend line holidays.   Will change IJ to PICC, CCM aware  Presumed endocarditis but hold off further work up due to limited functional improvement so far and likely poor prognosis  Anemia  Hb 8.3-6.8->8.3->8.8  S/p blood transfusion  Likely due to PEG tube site bleeding  Trauma Team on board, appreciate their help  Stool guaiac pending  Hx of ICH  2012 left BG ICH; during hospital course:  BP high at 200s  Urine positive with cocaine   S/p trach and PEG at that time  Lost follow up since  Acute respiratory failure  Intubated in ED due to airway protection  S/p trach, not able to wean off ventilation  On Risperdal and clonidine patch for agitation, scheduled ativan tapered off  Sputum culture continue to show MRSA 10/06/15  On vancomycin   Still has fever and tachycardia   CCM following  DM  A1C 6.9 on admission  Required high dose SSI  Increase lantus to 30 units bid  CBG monitoring Q6  Glucose much improved  ? Seizure  Questionable seizure presentation initially as reported in chart  EEG showed no seizure but under sedation  Hold off AEDs at this time  No seizure activity since admission  Hypernatremia  Na 152->151->149->148->143->143  continue free water   Hypertensive Emergency  BP as high as 202/124  off cardene drip  Stable   On clonidine patch  Hyperlipidemia  Home meds:  No statin  LDL not able to calculate due to high TG  , goal < 70  Total chol 170  on lipitor 10mg   Dysphagia   Hx Dysphagia secondary to ICH in 2012 with PEG  S/p PEG  On TF  Bowel regimen  Cocaine abuse  urine positive both in 2012 and this admission  Likely one of the causes of intracranial vascular stenosis  Need cessation counseling   Other Stroke Risk Factors  Obesity  Other Active Problems  Hepatitis C   Hospital day # 14  This patient is critically ill due to large left MCA infarct with left M2 occlusion s/p intervention, hemorrhagic transformation, bacteremia, acute blood loss anemia, PEG tube site bleeding, constant low-grade fever and at significant risk of neurological worsening, death form recurrent infarcts, increased ICH, brain herniation, cerebral edema, heart failure and sepsis. This patient's care requires constant monitoring of vital signs, hemodynamics,  respiratory and cardiac monitoring, review of multiple databases, neurological assessment, discussion with family, other specialists and medical decision making of high complexity. I spent 50 minutes of neurocritical care time in the care of this patient.  I have one-hour long phone call with pt sister Liborio Nixon and Niece Galen Daft seperately today, updating pt condition and treatment options and prognosis. Pt has poor prognosis for meaningful neuro recovery and her placement to rehab is problematic. We are seeking for out of state placement at this time. Sister understood the condition, and she would like me to talk to [facility] directly who can make decision. Galen Daft was questioning why pt seemed to have improvement while she was in ICU but not much in stepdown unit in 2C. I explained again everything to her including her on going ventilation dependent respiratory failure, PNA, bacteremia, anemia, PEG site bleeding, hyperglycemia.... She finally agrees about the palliative care consultation for goal of care. Pt code status still full code.  Marvel Plan, MD PhD Stroke  Neurology 10/12/2015 1:50 PM     To contact Stroke Continuity provider, please refer to [email or web address]. After hours, contact General Neurology

## 2015-10-13 ENCOUNTER — Encounter (HOSPITAL_COMMUNITY): Payer: Self-pay | Admitting: *Deleted

## 2015-10-13 DIAGNOSIS — R739 Hyperglycemia, unspecified: Secondary | ICD-10-CM

## 2015-10-13 DIAGNOSIS — J189 Pneumonia, unspecified organism: Secondary | ICD-10-CM

## 2015-10-13 LAB — CBC
HEMATOCRIT: 24.4 % — AB (ref 36.0–46.0)
HEMOGLOBIN: 7.9 g/dL — AB (ref 12.0–15.0)
MCH: 30.9 pg (ref 26.0–34.0)
MCHC: 32.4 g/dL (ref 30.0–36.0)
MCV: 95.3 fL (ref 78.0–100.0)
Platelets: 381 10*3/uL (ref 150–400)
RBC: 2.56 MIL/uL — AB (ref 3.87–5.11)
RDW: 14.5 % (ref 11.5–15.5)
WBC: 15 10*3/uL — ABNORMAL HIGH (ref 4.0–10.5)

## 2015-10-13 LAB — BASIC METABOLIC PANEL
Anion gap: 12 (ref 5–15)
BUN: 48 mg/dL — AB (ref 6–20)
CHLORIDE: 107 mmol/L (ref 101–111)
CO2: 22 mmol/L (ref 22–32)
CREATININE: 1.18 mg/dL — AB (ref 0.44–1.00)
Calcium: 8.4 mg/dL — ABNORMAL LOW (ref 8.9–10.3)
GFR calc Af Amer: 57 mL/min — ABNORMAL LOW (ref >60)
GFR calc non Af Amer: 49 mL/min — ABNORMAL LOW (ref >60)
GLUCOSE: 204 mg/dL — AB (ref 65–99)
POTASSIUM: 4.5 mmol/L (ref 3.5–5.1)
Sodium: 141 mmol/L (ref 135–145)

## 2015-10-13 LAB — GLUCOSE, CAPILLARY
GLUCOSE-CAPILLARY: 138 mg/dL — AB (ref 65–99)
GLUCOSE-CAPILLARY: 144 mg/dL — AB (ref 65–99)
Glucose-Capillary: 103 mg/dL — ABNORMAL HIGH (ref 65–99)
Glucose-Capillary: 155 mg/dL — ABNORMAL HIGH (ref 65–99)
Glucose-Capillary: 174 mg/dL — ABNORMAL HIGH (ref 65–99)
Glucose-Capillary: 184 mg/dL — ABNORMAL HIGH (ref 65–99)

## 2015-10-13 MED ORDER — PIPERACILLIN-TAZOBACTAM 3.375 G IVPB
3.3750 g | Freq: Three times a day (TID) | INTRAVENOUS | Status: DC
  Administered 2015-10-13 – 2015-10-22 (×28): 3.375 g via INTRAVENOUS
  Filled 2015-10-13 (×32): qty 50

## 2015-10-13 NOTE — Clinical Documentation Improvement (Signed)
Neurology  Can the diagnosis of systemic infection be further specified? Please specify if Sepsis was present on admission.    Sepsis - specify causative organism if known  Determine if there is Severe Sepsis (Sepsis with organ dysfunction - specify), Septic Shock if present  Identify etiology of Sepsis - Device, Implant, Graft, Infusion, Abortion}  Other  Clinically Undetermined  Document any associated diagnoses/conditions.   Supporting Information: This patient is critically ill due to large left MCA infarct with left M2 occlusion s/p intervention, hemorrhagic transformation, bacteremia and at significant risk of neurological worsening, death form recurrent infarcts, increased ICH, brain herniation, cerebral edema, heart failure and sepsis per 10/07/15 progress notes.   Please exercise your independent, professional judgment when responding. A specific answer is not anticipated or expected.   Thank Sabino Donovan [employer] [facility] [place] [phone]

## 2015-10-13 NOTE — Progress Notes (Signed)
PULMONARY / CRITICAL CARE MEDICINE   Name: Linda [name] MRN: [id number] DOB: 1956-08-25    ADMISSION DATE:  09/28/2015 CONSULTATION DATE:  2/2  REFERRING MD:  Neuro  CHIEF COMPLAINT:  Obtunded   SUBJECTIVE:  No sig change  VITAL SIGNS: BP 129/65 mmHg  Pulse 121  Temp(Src) 100.3 F (37.9 C) (Axillary)  Resp 11  Ht  (1.702 m)  Wt 254 lb 3.1 oz (115.3 kg)  BMI 39.80 kg/m2  SpO2 99%  VENTILATOR SETTINGS: Vent Mode:  [-] PRVC FiO2 (%):  [30 %] 30 % Set Rate:  [20 bmp] 20 bmp Vt Set:  [450 mL] 450 mL PEEP:  [5 cmH20] 5 cmH20 Plateau Pressure:  [15 cmH20-20 cmH20] 19 cmH20  INTAKE / OUTPUT: I/O last 3 completed shifts: In: 2320 [I.V.:20; Other:950; NG/GT:900; IV Piggyback:450] Out: 201 [Urine:200; Other:1]  PHYSICAL EXAMINATION: General: on vent, grimaced to pain, yawns occasionally  Neuro: opens eyes with stimulation, becomes agitated at times. Rt side flaccid HEENT: trach site clean Cardiac: RRR, Nl S1/S2, -M/R/G. Chest: Coarse BS bilaterally. No accessory muscle use  Abd: g tube site clean Ext: 1+ edema Skin: no rashes  LABS:  BMET  Recent Labs Lab 10/10/15 1205 10/11/15 0520 10/12/15 0240  NA 148* 143 143  K 4.4 4.1 4.7  CL 115* 113* 110  CO2 BUN 54* 56* 49*  CREATININE 1.40* 1.37* 1.21*  GLUCOSE 288* 243* 174*   Electrolytes  Recent Labs Lab 10/08/15 0400  10/10/15 1205 10/11/15 0520 10/12/15 0240  CALCIUM 8.4*  < > 8.5* 8.1* 8.5*  MG 2.3  --   --   --   --   PHOS 3.8  --   --   --   --   < > = values in this interval not displayed. CBC CBC Latest Ref Rng 10/12/2015 10/11/2015 10/11/2015  WBC 4.0 - 10.5 K/uL 16.7(H) 16.5(H) 13.9(H)  Hemoglobin 12.0 - 15.0 g/dL 8.4(O) 8.3(L) 6.8(LL)  Hematocrit 36.0 - 46.0 % 27.5(L) 25.6(L) 22.5(L)  Platelets 150 - 400 K/uL 334 356 360    Coag's No results for input(s): APTT, INR in the last 168 hours. Sepsis Markers  Recent Labs Lab 10/06/15 1223 10/07/15 0430 10/08/15 0400   PROCALCITON 0.37 0.33 0.25   ABG No results for input(s): PHART, PCO2ART, PO2ART in the last 168 hours. Glucose  Recent Labs Lab 10/12/15 1220 10/12/15 1706 10/12/15 1956 10/12/15 2344 10/13/15 0353 10/13/15 0745  GLUCAP 155* 148* 141* 144* 103* 155*   Imaging  I reviewed CXR myself, trach in good position.  No results found. STUDIES:  EEG 2/02 >> consistent with a sleep/sedation EEG with a possible suggestion of a left central cerebral dysfunction Echo 2/03 >> EF 55 to 60%, grade 1 diastolic dysfx Doppler legs b/l 2/04 >> no DVT  CULTURES: 2/2 Blood >> negative 2/2 Urine >> negative 2/2 Sputum >> oral flora 2/4 Sputum >> MRSA 2/4 Blood >> Coag neg Staph 2/9 Blood >>GPC>> 2/10 Sputum >>MRSA 2/12 repeat sputum ulture MRSA  ANTIBIOTICS: 2/7 Vancomycin >>  2/10 Fortaz >> 2/12  SIGNIFICANT EVENTS: 2/2 Admit, neuro IR >> revascularization of Lt MCA inferior division 2/7 Trach, G tube  LINES/TUBES: 2/02 ETT >> 2/07 2/07 Trach (JY) >>  2/07 Gtube (CCS) >> 2/10 Rt IJ CVL >>2/16  I reviewed CXR myself, trach in good position, infiltrate noted.  DISCUSSION: 60 yo female with Lt MCA CVA, VDRF.  UDS positive for cocaine.  Failed to wean from vent  due to mental status, and required tracheostomy.  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 No sig improvement. Palliative care consult pending. Agree w/ this as think prognosis is poor for any functional recovery   ASSESSMENT / PLAN:  NEUROLOGY A: Acute encephalopathy 2nd to Lt MCA CVA and cocaine. P: Continue ASA Continue clonidne for agitation and substance abuse. May need to bump up risperdal.   PULMONARY A: Compromised airway in setting of CVA. Failure to wean from vent s/p tracheostomy 2/07. P:   Pressure support wean as able. Needs vent SNF. F/u CXR PRN  CARDIOVASCULAR A:  Hx of HTN, exacerbated by cocaine use.  P:  Goal SBP < 160   RENAL A:    CKD stage 2 >> baseline renal fx 1.27 from 02/28/14. Hypernatremia-->improved P:   Monitor renal fx, urine outpt Increased free water, f/u BMET Treat glucose more aggressively  Will need diureses once sodium improves.  GASTROINTESTINAL A:   Dysphagia s/p G tube 2/07. P:   Tube feeds Protonix for SUP  HEMATOLOGIC A:   Anemia of critical illness.  P:  F/u CBC intermittently SQ heparin, SCD's for DVT prevention Transfuse   INFECTIOUS A:   Persistent Fever  MRSA HCAP Coag neg Staph bacteremia-->worrisome for native valve endocarditis-->last ECHO neg Trach site ulceration  P:   Day 10 of vancomycin Repeated blood cx 2/09>>GPC>>coag neg Repeat sputum cx 2/10: MRSA Add fortaz 2/10>>dc Further recs per ID--> at some point will need PICC Wound care per WOC RN  ENDOCRINE   A:   DM type II. P:   SSI Cont lantus   Disposition >> will likely need Vent/snf   Simonne Martinet ACNP-BC [facility] Pulmonary/Critical Care Pager # [phone] OR # [phone] if no answer  10/13/2015 10:57 AM  Attending Note:   60 year old female with a second cocaine induced stroke, now completely unresponsive, family continues to wish for full support. She is not weanable. On exam, RR in the 40 on 5/5. Needs a vent-SNF. I reviewed CXR myself, infiltrate noted and trach ok. Discussed with bedside RN and PCCM-NP and RT regarding plan of care.  Respiratory failure: - Continue weaning efforts. - Recommend vent SNF, not weanable. - Titrate O2 for sat of 88-92%.  Tracheostomy status: - Do not downsize or replace. - Sutures removed, insure tied properly.  Persistent MRSA infection: - D/C TLC. - Continue vanc. - Appreciate input from ID.  Alyson Reedy, M.D. [facility] Pulmonary/Critical Care Medicine. Pager: [phone]. After hours pager: [phone].

## 2015-10-13 NOTE — Progress Notes (Signed)
CT scan does not show any abnormal placement of the PEG.  Likely an infected rectus hematoma.  Tube can be used as normal.  I would place the patient on antibiotics that cover enteric bacteria.  Allow the hematoma to drain--as long as it is not bright red blood, should not drop her hemoglobin.  Marta Lamas. Gae Bon, MD, FACS [phone] Trauma Surgeon

## 2015-10-13 NOTE — Progress Notes (Signed)
CSW received a call from  [facility] Admissions from [facility] in [place].  Stated that patient has been approved clinically but will need application for [place].  She will arranged for a representative to come to the hospital next week to assist family with [facility] Medicaid application and will follow up with unit CSW at that time.  Lorri Frederick. Jaci Lazier, [place] [phone]

## 2015-10-13 NOTE — Progress Notes (Signed)
STROKE TEAM PROGRESS NOTE   SUBJECTIVE (INTERVAL HISTORY) No family is at the bedside. Still have fever and tachycardia. Glucose much better, on lantus 30units bid. Na 143 today, stable and Cre improving. Still required sedation with ativan PRN for agitation over night, but not fentanyl last night. Discussed with family yesterday and they agree with palliative care consult. I have called them today for consult.  OBJECTIVE Temp:  [99.3 F (37.4 C)-101.5 F (38.6 C)] 100.3 F (37.9 C) (02/17 0746) Pulse Rate:  [83-123] 121 (02/17 0900) Cardiac Rhythm:  [-] Sinus tachycardia (02/17 0745) Resp:  [0-49] 11 (02/17 0900) BP: (97-134)/(54-66) 129/65 mmHg (02/17 0900) SpO2:  [92 %-100 %] 99 % (02/17 0900) FiO2 (%):  [30 %] 30 % (02/17 0718) Weight:  [254 lb 3.1 oz (115.3 kg)] 254 lb 3.1 oz (115.3 kg) (02/17 0356)  CBC:   Recent Labs Lab 10/11/15 1729 10/12/15 0240  WBC 16.5* 16.7*  HGB 8.3* 8.8*  HCT 25.6* 27.5*  MCV 96.6 93.9  PLT 356 334    Basic Metabolic Panel:   Recent Labs Lab 10/08/15 0400  10/11/15 0520 10/12/15 0240  NA 151*  < > 143 143  K 3.4*  < > 4.1 4.7  CL 120*  < > 113* 110  CO2 22  < > 23 23  GLUCOSE 229*  < > 243* 174*  BUN 45*  < > 56* 49*  CREATININE 1.18*  < > 1.37* 1.21*  CALCIUM 8.4*  < > 8.1* 8.5*  MG 2.3  --   --   --   PHOS 3.8  --   --   --   < > = values in this interval not displayed.  Lipid Panel:     Component Value Date/Time   CHOL 170 09/28/2015 0938   TRIG 245* 10/04/2015 0841   HDL 38* 09/28/2015 0938   CHOLHDL 4.5 09/28/2015 0938   VLDL UNABLE TO CALCULATE IF TRIGLYCERIDE OVER 400 mg/dL 78/29/5621 3086   LDLCALC UNABLE TO CALCULATE IF TRIGLYCERIDE OVER 400 mg/dL 57/84/6962 9528   UXLK4M:  Lab Results  Component Value Date   HGBA1C 6.9* 09/28/2015   Urine Drug Screen:     Component Value Date/Time   LABOPIA NONE DETECTED 09/28/2015 0226   LABBENZ NONE DETECTED 09/28/2015 0226   AMPHETMU NONE DETECTED 09/28/2015 0226    THCU NONE DETECTED 09/28/2015 0226   LABBARB NONE DETECTED 09/28/2015 0226      IMAGING I have personally reviewed the radiological images below and agree with the radiology interpretations.  Ct Head Wo Contrast 09/28/2015  1. New calcific density in the left sylvian fissure, possible calcific distal M2 embolus. No visible acute infarct or hemorrhage. 2. Advanced chronic small vessel disease with multiple lacunar infarcts.   CTA NECK 09/28/2015   Moderately motion degraded examination, limited by LEFT venous contamination. No definite acute vascular process or hemodynamically significant stenosis. 3.5 x 5.7 cm dominant LEFT thyroid not oral for which follow up thyroid sonogram is recommended on a nonemergent basis.   CTA HEAD 09/28/2015   LEFT M2 segment occlusion corresponding to sylvian fissure density on today's CT, likely acute. Complete circle of Willis.  Mild intracranial atherosclerosis. Moderate calcific atherosclerosis the carotid siphons.   Cerebral Angiogram 09/28/2015 S/P Lt internal carotid arteriogram followed by complete revascularization of occluded dominant inferior division of LT MCA with x pass with trevoprovue 4 mmx 30 mm retrieval device and 6 mg of superselective Integrelin achieving a TICI 3 flow.  MRI  Large acute hemorrhagic left hemispheric infarct involves the majority of the left middle cerebral artery distribution. Hemorrhage is most notable involving the left posterior temporal -parietal aspect of this acute infarct. Local mass effect upon the left lateral ventricle. At most there is minimal bowing of the septum to the right. Several small acute nonhemorrhagic right hemispheric infarcts involving right caudate head, superior aspect of the post limb right internal capsule, right frontal lobe, superior right lenticular nucleus and right occipital lobe. Several small acute nonhemorrhagic cerebellar infarcts greater on the right. Remote left lenticular nucleus  infarct or hematoma with blood-stained cleft now noted. Moderate to marked chronic small vessel disease changes. Atrophy.  EEG Abnormalities: 1) possible attenuation of sleep structures in the left central region 2) lack of waking state Clinical Interpretation: This EEG is consistent with a sleep/sedation EEG with a possible suggestion of a left central cerebral dysfunction, though I don't think this is definite by this study.  2D echo - Systolic function was normal. The estimated ejection fraction was in the range of 55% to 60%. Wall motion was normal; there were no regional wall motion abnormalities. Doppler parameters are consistent with abnormal left ventricular relaxation (grade 1 diastolic dysfunction).  LE venous doppler -   no DVT   Dg Chest Port 1 View 10/08/2015  IMPRESSION: Atelectasis left mid lung. Lungs elsewhere clear. No change in cardiac silhouette. Tube and catheter positions as described without pneumothorax.   10/09/2015 IMPRESSION: Slight interval improvement in the pulmonary interstitium. Persistent subsegmental atelectasis in the left mid lung. No pleural effusion or pneumothorax. The support tubes are in reasonable position.   Ct Abdomen Pelvis Wo Contrast 10/12/2015  IMPRESSION: The gastrostomy tube is appropriately positioned and there is a very small amount hemorrhage within the rectus musculature and subcutaneous fat surrounding the tube insertion site. In the lungs, bibasilar atelectasis versus airspace disease is present. Milk-of-calcium bile versus gallstones.  Ultrasound may be helpful.    Physical exam  Temp:  [99.3 F (37.4 C)-101.5 F (38.6 C)] 100.3 F (37.9 C) (02/17 0746) Pulse Rate:  [83-123] 121 (02/17 0900) Resp:  [0-49] 11 (02/17 0900) BP: (97-134)/(54-66) 129/65 mmHg (02/17 0900) SpO2:  [92 %-100 %] 99 % (02/17 0900) FiO2 (%):  [30 %] 30 % (02/17 0718) Weight:  [254 lb 3.1 oz (115.3 kg)] 254 lb 3.1 oz (115.3 kg) (02/17  0356)  General - Well nourished, well developed, s/p trach and PEG.  Ophthalmologic - Fundi not visualized due to noncooperation.  Cardiovascular - Regular rhythm, but tachycardia.  Neruo - drowsy, sleepy, received ativan at 7am and 8am today. Eyes barely open with with pain stimulation, nonverbal, s/p trach and peg, on ventilation. Not following any commands. PERRL, doll's eye positive to both directions. Positive corneal and gag. Right facial droop. tongue in middle inside mouth. Spontaneous movement LUE and LLE on pain stimulation. RUE and RLE hemiplegia, with increased tone. DTR 1+, no babinski. Sensation, gait and coordination not tested.   ASSESSMENT/PLAN Linda [name] is a 60 y.o. female with history of left BG ICH in 2012, hypertension, cocaine abuse, renal failure and hepatitis C presenting with altered mental status, right side weakness and leftward gaze. Also concerning for possible seizure activity initial presentation. She did not receive IV t-PA due to hx ICH, CTA showed a L M2 occlusion, she was sent to IR where she received complete TICI3 revascularization of the L MCA with mechanical thrombectomy with trevo and IA Integrilin.  Stroke:  left MCA infarct  with hemorrhagic transformation s/p revascularizationf of L M2, multifocal scattered infarcts involving bilateral posterior and anterior circulation, consistent with cardioembolic source although pt does have significant large vessel intracranial atherosclerosis as well as cocaine abuse.  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  CTA neck L thyroid nodule  2D Echo   EF 55-60%  LE venous doppler negative for DVT  LDL unable to calculate due to high TG   HgbA1c 6.9  UDS positive for cocaine  Heparin subq for VTE prophylaxis Diet NPO time specified  No antithrombotic prior to admission, now on ASA 325mg .   Ongoing aggressive stroke risk factor management  Therapy recommendations:  SNF with vent support  Disposition:  pending   Palliative care consulted for goal of care  Bacteremia   Positive blood Cx 10/05/15 G+ in cluster with coagulase negative 2/2  Leukocytosis WBC 16.2->16.8->14.8 -> 14.0-> 13.9->16.5->16.7  Sensitive to vanco  Central line placed  ID recs appreciated, recommend line holidays.   Will change IJ to PICC, CCM aware  Presumed endocarditis but hold off further work up due to limited functional improvement so far and likely poor prognosis  Anemia  Hb 8.3-6.8->8.3->8.8  S/p blood transfusion  Likely due to PEG tube site bleeding  Trauma Team on board, appreciate their help  Stool guaiac pending  Hx of ICH  2012 left BG ICH; during hospital course:  BP high at 200s  Urine positive with cocaine   S/p trach and PEG at that time  Lost follow up since  Acute respiratory failure  Intubated in ED due to airway protection  S/p trach, not able to wean off ventilation  On Risperdal and clonidine patch for agitation, scheduled ativan tapered off  Sputum culture continue to show MRSA 10/06/15  On vancomycin   Still has fever and tachycardia   CCM following  DM  A1C 6.9 on admission  Required high dose SSI  Increase lantus to 30 units bid  CBG monitoring Q6  Glucose much improved  ? Seizure  Questionable seizure presentation initially as reported in chart  EEG showed no seizure but under sedation  Hold off AEDs at this time  No seizure activity since admission  Hypernatremia  Na 152->151->149->148->143->143  continue free water   Hypertensive Emergency  BP as high as  202/124  off cardene drip  Stable   On clonidine patch  Hyperlipidemia  Home meds:  No statin  LDL not able to calculate due to high TG , goal < 70  Total chol 170  on lipitor 10mg   Dysphagia   Hx Dysphagia secondary to ICH in 2012 with PEG  S/p PEG  On TF  Bowel regimen  Cocaine abuse  urine positive both in 2012 and this admission  Likely one of the causes of intracranial vascular stenosis  Need cessation counseling   Other Stroke Risk Factors  Obesity  Other Active Problems  Hepatitis C   Hospital day # 15  This patient is critically ill due to large left MCA infarct with left M2 occlusion s/p intervention, hemorrhagic transformation, bacteremia, acute blood loss anemia, PEG tube site bleeding, constant low-grade fever and at significant risk of neurological worsening,  death form recurrent infarcts, increased ICH, brain herniation, cerebral edema, heart failure and sepsis. This patient's care requires constant monitoring of vital signs, hemodynamics, respiratory and cardiac monitoring, review of multiple databases, neurological assessment, discussion with family, other specialists and medical decision making of high complexity. I spent 40 minutes of neurocritical care time in the care of this patient.   Marvel Plan, MD PhD Stroke Neurology 10/13/2015 10:24 AM     To contact Stroke Continuity provider, please refer to [email or web address]. After hours, contact General Neurology

## 2015-10-13 NOTE — Clinical Documentation Improvement (Signed)
Neurology  Can the diagnosis of pressure ulcer be further specified?   Pressure Ulcer Stage - Stage1, Stage 2, Stage 3, Stage 4, Unstageable, Unspecified, Unable to Clinically Determine  Other  Clinically Undetermined    Supporting Information: Pressure ulcer at trach site per 02/15 progress notes.   Please exercise your independent, professional judgment when responding. A specific answer is not anticipated or expected.   Thank Sabino Donovan [employer] Information Management [place] [phone]

## 2015-10-13 NOTE — Progress Notes (Addendum)
Pharmacy Antibiotic Note  59 YOF continues on day #4 of vancomycin for CoNS bacteremia, MRSA PNA and presumed endocarditis.  Zosyn to be added today for infected rectus hematoma.  She is febrile and her last WBC was elevated.  Patient's renal function remains stable.  Plan: - Zosyn 3.375gm IV Q8H, 4 hr infusion - Continue vanc  IV Q12H - Monitor clinical picture, renal function, repeat vanc trough tomorrow  Height:  (170.2 cm) Weight: 254 lb 3.1 oz (115.3 kg) IBW/kg (Calculated) : 61.6  Temp (24hrs), Avg:100.5 F (38.1 C), Min:99.3 F (37.4 C), Max:101.5 F (38.6 C)   Recent Labs Lab 10/07/15 1728 10/08/15 0400 10/09/15 0445 10/10/15 1205 10/10/15 1245 10/11/15 0520 10/11/15 1729 10/12/15 0240  WBC  --  16.8* 14.8* 14.0*  --  13.9* 16.5* 16.7*  CREATININE  --  1.18* 1.21* 1.40*  --  1.37*  --  1.21*  VANCOTROUGH 13  --   --   --  21*  --   --   --     Estimated Creatinine Clearance: 64.9 mL/min (by C-G formula based on Cr of 1.21).    No Known Allergies  Antimicrobials this admission: Vanc 2/7 >> Ceftaz 2/10 >> 2/12 Zosyn 2/17 >>  Dose adjustments this admission: 2/11 VT = 13 on  IV Q24 2/14 VT = 21 on  IV Q12 (Drawn ~1 hr early but ok)  Microbiology results: 2/14 Blood - ngtd 2/10 TA - MRSA 2/9 Blood - NGTD 2/4 Blood 2/2 CoNS 2/4 TA - MRSA 2/2 Blood -NEG 2/2 TA - NEG 2/2 Urine - NEG 2/2 MRSA - NEG   Bryne Lindon D. Laney Potash, PharmD, BCPS Pager:  [phone] 10/13/2015, 10:01 AM

## 2015-10-14 DIAGNOSIS — I63232 Cerebral infarction due to unspecified occlusion or stenosis of left carotid arteries: Secondary | ICD-10-CM

## 2015-10-14 LAB — BASIC METABOLIC PANEL
ANION GAP: 8 (ref 5–15)
BUN: 53 mg/dL — ABNORMAL HIGH (ref 6–20)
CO2: 22 mmol/L (ref 22–32)
Calcium: 8.2 mg/dL — ABNORMAL LOW (ref 8.9–10.3)
Chloride: 108 mmol/L (ref 101–111)
Creatinine, Ser: 1.33 mg/dL — ABNORMAL HIGH (ref 0.44–1.00)
GFR calc Af Amer: 49 mL/min — ABNORMAL LOW (ref >60)
GFR, EST NON AFRICAN AMERICAN: 42 mL/min — AB (ref >60)
GLUCOSE: 227 mg/dL — AB (ref 65–99)
POTASSIUM: 4.5 mmol/L (ref 3.5–5.1)
Sodium: 138 mmol/L (ref 135–145)

## 2015-10-14 LAB — GLUCOSE, CAPILLARY
GLUCOSE-CAPILLARY: 188 mg/dL — AB (ref 65–99)
GLUCOSE-CAPILLARY: 180 mg/dL — AB (ref 65–99)
Glucose-Capillary: 155 mg/dL — ABNORMAL HIGH (ref 65–99)
Glucose-Capillary: 181 mg/dL — ABNORMAL HIGH (ref 65–99)
Glucose-Capillary: 187 mg/dL — ABNORMAL HIGH (ref 65–99)
Glucose-Capillary: 204 mg/dL — ABNORMAL HIGH (ref 65–99)

## 2015-10-14 LAB — CBC
HEMATOCRIT: 24.2 % — AB (ref 36.0–46.0)
HEMOGLOBIN: 7.5 g/dL — AB (ref 12.0–15.0)
MCH: 29.3 pg (ref 26.0–34.0)
MCHC: 31 g/dL (ref 30.0–36.0)
MCV: 94.5 fL (ref 78.0–100.0)
PLATELETS: 381 10*3/uL (ref 150–400)
RBC: 2.56 MIL/uL — AB (ref 3.87–5.11)
RDW: 14.6 % (ref 11.5–15.5)
WBC: 10.8 10*3/uL — AB (ref 4.0–10.5)

## 2015-10-14 LAB — VANCOMYCIN, TROUGH: VANCOMYCIN TR: 27 ug/mL — AB (ref 10.0–20.0)

## 2015-10-14 MED ORDER — VANCOMYCIN HCL 500 MG IV SOLR
500.0000 mg | Freq: Two times a day (BID) | INTRAVENOUS | Status: DC
  Administered 2015-10-14 – 2015-10-25 (×21): 500 mg via INTRAVENOUS
  Filled 2015-10-14 (×23): qty 500

## 2015-10-14 NOTE — Progress Notes (Signed)
Pharmacy Antibiotic Note  59 YOF continues on day #5/42 of abx for CoNS bacteremia, MRSA PNA. Day #1 = 2/14. ID rec treating for presumed endocarditis for 6 wks from 2/14 if blood cx remains negative.  Zosyn added for infected rectus hematoma - Tm 101.5, WBC down to wnl. SCr up to 1.33, CrCl ~98ml/min.  Plan: Continue Zosyn 3.375gm IV Q8H, 4 hr infusion Decrease vancomycin to  IV Q12H and start later tonight Monitor clinical picture, renal function, VT at new Css F/U C&S, abx deescalation / LOT  Height:  (170.2 cm) Weight: 253 lb 12 oz (115.1 kg) IBW/kg (Calculated) : 61.6  Temp (24hrs), Avg:99.4 F (37.4 C), Min:98.1 F (36.7 C), Max:100.3 F (37.9 C)   Recent Labs Lab 10/10/15 1205 10/10/15 1245 10/11/15 0520 10/11/15 1729 10/12/15 0240 10/13/15 1040 10/14/15 0314 10/14/15 1323  WBC 14.0*  --  13.9* 16.5* 16.7* 15.0* 10.8*  --   CREATININE 1.40*  --  1.37*  --  1.21* 1.18* 1.33*  --   VANCOTROUGH  --  21*  --   --   --   --   --  27*    Estimated Creatinine Clearance: 58.9 mL/min (by C-G formula based on Cr of 1.33).    No Known Allergies  Antimicrobials this admission: Vanc 2/7 >> Ceftaz 2/10 >> 2/12 Zosyn 2/17 >>  Dose adjustments this admission: 2/11 VT = 13 on  IV Q24 2/14 VT = 21 on  IV Q12 (Drawn ~1 hr early but ok) 2/18 VT = 27 on  IV Q12 (Drawn about 1 hr early but true trough will still be elevated)  Microbiology results: 2/14 Blood - ngtd 2/10 TA - MRSA 2/9 Blood - NGTD 2/4 Blood 2/2 CoNS 2/4 TA - MRSA 2/2 Blood -NEG 2/2 TA - NEG 2/2 Urine - NEG 2/2 MRSA - NEG  Enzo Bi, PharmD, BCPS Clinical Pharmacist Pager [phone] 10/14/2015 2:34 PM

## 2015-10-14 NOTE — Progress Notes (Signed)
STROKE TEAM PROGRESS NOTE    History at Time of Admission Linda [name] is an 60 y.o. female with a history of intracerebral hemorrhage in 2012, hypertension, cocaine abuse, renal failure and hepatitis C, seen in the ED at [facility] after becoming unresponsive with leftward gaze and right facial droop area there was also concern for possible seizure activity initially. She was reportedly last known well at midnight. CT scan of her head showed no acute intracranial abnormality except for small increased density posterior left sylvian fissure. Patient was not a TPA candidate because of a history of intracerebral hemorrhage in 2012. She was transferred to [facility] for further management, including possible interventional radiology procedures. CT angiogram of left M2 segment occlusion responding to the high density abnormality seen in sylvian fissure on CT scan. Study was otherwise unremarkable. Patient was intubated in the ED at [facility] for airway protection. Urine drug screen was positive for cocaine. Her pressure was elevated at 210/127. Cardene drip IV was started.   LSN: 12:00 AM on 09/28/2015 tPA Given: No: History of ICH in 2012 mRankin:      SUBJECTIVE (INTERVAL HISTORY) No family at bedside. No events overnight, patient non verbal, stable clinically. Per Clinical Social work note: CSW received a call from [facility]missions from [facility] in [place]. Stated that patient has been approved clinically but will need application for [place]. She will arranged for a representative to come to the hospital next week.     OBJECTIVE Temp:  [98.1 F (36.7 C)-100.3 F (37.9 C)] 100.3 F (37.9 C) (02/18 0800) Pulse Rate:  [79-121] 111 (02/18 0800) Cardiac Rhythm:  [-] Sinus tachycardia (02/18 0700) Resp:  [0-41] 40 (02/18 0800) BP: (84-133)/(52-74) 132/66 mmHg (02/18 0800) SpO2:  [97 %-100 %] 98 % (02/18 0800) FiO2 (%):  [30 %] 30 % (02/18 0839) Weight:  [115.1 kg (253 lb 12 oz)]  115.1 kg (253 lb 12 oz) (02/18 0454)  CBC:   Recent Labs Lab 10/13/15 1040 10/14/15 0314  WBC 15.0* 10.8*  HGB 7.9* 7.5*  HCT 24.4* 24.2*  MCV 95.3 94.5  PLT 381 381    Basic Metabolic Panel:   Recent Labs Lab 10/08/15 0400  10/13/15 1040 10/14/15 0314  NA 151*  < > 141 138  K 3.4*  < > 4.5 4.5  CL 120*  < > 107 108  CO2 22  < > 22 22  GLUCOSE 229*  < > 204* 227*  BUN 45*  < > 48* 53*  CREATININE 1.18*  < > 1.18* 1.33*  CALCIUM 8.4*  < > 8.4* 8.2*  MG 2.3  --   --   --   PHOS 3.8  --   --   --   < > = values in this interval not displayed.  Lipid Panel:     Component Value Date/Time   CHOL 170 09/28/2015 0938   TRIG 245* 10/04/2015 0841   HDL 38* 09/28/2015 0938   CHOLHDL 4.5 09/28/2015 0938   VLDL UNABLE TO CALCULATE IF TRIGLYCERIDE OVER 400 mg/dL 16/05/9603 5409   LDLCALC UNABLE TO CALCULATE IF TRIGLYCERIDE OVER 400 mg/dL 81/19/1478 2956   OZHY8M:  Lab Results  Component Value Date   HGBA1C 6.9* 09/28/2015   Urine Drug Screen:     Component Value Date/Time   LABOPIA NONE DETECTED 09/28/2015 0226   LABBENZ NONE DETECTED 09/28/2015 0226   AMPHETMU NONE DETECTED 09/28/2015 0226   THCU NONE DETECTED 09/28/2015 0226   LABBARB NONE DETECTED  09/28/2015 0226      IMAGING I have personally reviewed the radiological images below and agree with the radiology interpretations.  Ct Head Wo Contrast 09/28/2015  1. New calcific density in the left sylvian fissure, possible calcific distal M2 embolus. No visible acute infarct or hemorrhage. 2. Advanced chronic small vessel disease with multiple lacunar infarcts.   CTA NECK 09/28/2015   Moderately motion degraded examination, limited by LEFT venous contamination. No definite acute vascular process or hemodynamically significant stenosis. 3.5 x 5.7 cm dominant LEFT thyroid not oral for which follow up thyroid sonogram is recommended on a nonemergent basis.   CTA HEAD 09/28/2015   LEFT M2 segment occlusion  corresponding to sylvian fissure density on today's CT, likely acute. Complete circle of Willis.  Mild intracranial atherosclerosis. Moderate calcific atherosclerosis the carotid siphons.   Cerebral Angiogram 09/28/2015 S/P Lt internal carotid arteriogram followed by complete revascularization of occluded dominant inferior division of LT MCA with x pass with trevoprovue 4 mmx 30 mm retrieval device and 6 mg of superselective Integrelin achieving a TICI 3 flow.  MRI  Large acute hemorrhagic left hemispheric infarct involves the majority of the left middle cerebral artery distribution. Hemorrhage is most notable involving the left posterior temporal -parietal aspect of this acute infarct. Local mass effect upon the left lateral ventricle. At most there is minimal bowing of the septum to the right. Several small acute nonhemorrhagic right hemispheric infarcts involving right caudate head, superior aspect of the post limb right internal capsule, right frontal lobe, superior right lenticular nucleus and right occipital lobe. Several small acute nonhemorrhagic cerebellar infarcts greater on the right. Remote left lenticular nucleus infarct or hematoma with blood-stained cleft now noted. Moderate to marked chronic small vessel disease changes. Atrophy.  EEG Abnormalities: 1) possible attenuation of sleep structures in the left central region 2) lack of waking state Clinical Interpretation: This EEG is consistent with a sleep/sedation EEG with a possible suggestion of a left central cerebral dysfunction, though I don't think this is definite by this study.  2D echo - Systolic function was normal. The estimated ejection fraction was in the range of 55% to 60%. Wall motion was normal; there were no regional wall motion abnormalities. Doppler parameters are consistent with abnormal left ventricular relaxation (grade 1 diastolic dysfunction).  LE venous doppler -   no DVT   Dg Chest  Port 1 View 10/08/2015  IMPRESSION: Atelectasis left mid lung. Lungs elsewhere clear. No change in cardiac silhouette. Tube and catheter positions as described without pneumothorax.   10/09/2015 IMPRESSION: Slight interval improvement in the pulmonary interstitium. Persistent subsegmental atelectasis in the left mid lung. No pleural effusion or pneumothorax. The support tubes are in reasonable position.   Ct Abdomen Pelvis Wo Contrast 10/12/2015  IMPRESSION: The gastrostomy tube is appropriately positioned and there is a very small amount hemorrhage within the rectus musculature and subcutaneous fat surrounding the tube insertion site. In the lungs, bibasilar atelectasis versus airspace disease is present. Milk-of-calcium bile versus gallstones.  Ultrasound may be helpful.    Physical exam  Temp:  [98.1 F (36.7 C)-100.3 F (37.9 C)] 100.3 F (37.9 C) (02/18 0800) Pulse Rate:  [79-121] 111 (02/18 0800) Resp:  [0-41] 40 (02/18 0800) BP: (84-133)/(52-74) 132/66 mmHg (02/18 0800) SpO2:  [97 %-100 %] 98 % (02/18 0800) FiO2 (%):  [30 %] 30 % (02/18 0839) Weight:  [115.1 kg (253 lb 12 oz)] 115.1 kg (253 lb 12 oz) (02/18 0454)  General - Well nourished, well  developed, s/p trach and PEG.  Ophthalmologic - Fundi not visualized due to noncooperation.  Cardiovascular - Regular rhythm, but tachycardia. No M/R/G  Neruo - Opens eyes to sternal rub,  Nonverbal, not following commands, grimaces to sternal run and pain, s/p trach and peg, on ventilation. Not following any commands. PERRL, left gaze preference, Positive corneal and gag. Right facial droop. tongue in middle inside mouth. Spontaneous movement LUE and LLE on pain stimulation. RUE and RLE withdraws less than left side to pain, with increased tone. DTR 1+, toes equiv.. Sensation, gait and coordination not tested.   ASSESSMENT/PLAN Ms. ZERIAH BAYSINGER is a 60 y.o. female with history of left BG ICH in 2012, hypertension, cocaine abuse, renal  failure and hepatitis C presenting with altered mental status, right side weakness and leftward gaze. Also concerning for possible seizure activity initial presentation. She did not receive IV t-PA due to hx ICH, CTA showed a L M2 occlusion, she was sent to IR where she received complete TICI3 revascularization of the L MCA with mechanical thrombectomy with trevo and IA Integrilin.  Stroke:  left MCA infarct with hemorrhagic transformation s/p revascularizationf of L M2, multifocal scattered infarcts involving bilateral posterior and anterior circulation, consistent with cardioembolic source although pt does have significant large vessel intracranial atherosclerosis as well as cocaine abuse.  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  CTA neck L thyroid nodule  2D Echo  EF 55-60%  LE venous doppler negative for DVT  LDL unable to calculate due to high TG   HgbA1c 6.9  UDS positive for cocaine  Heparin subq for VTE prophylaxis Diet NPO time specified  No antithrombotic prior to admission, now on ASA 325mg .   Ongoing aggressive stroke risk factor management  Therapy recommendations:  SNF with vent support  Disposition:  pending   Palliative care consulted for goal of care - pending  Bacteremia   Positive blood Cx 10/05/15 G+ in cluster with coagulase negative 2/2  Leukocytosis WBC 16.2->16.8->14.8 -> 14.0-> 13.9->16.5->16.7 -> 10.8 2/18 at 10am  Sensitive to vanco  Central line placed  ID recs appreciated, recommend line holidays.   Will change IJ to PICC, CCM aware  Presumed endocarditis but hold off further work up  due to limited functional improvement so far and likely poor prognosis  Anemia   Hb 8.3-6.8->8.3->8.8 -> 7.9 -> 7.5 Saturday - recheck in AM  S/p blood transfusion  Likely due to PEG tube site bleeding  Trauma Team on board, appreciate their help  Stool guaiac pending (negative 10/12/2015)  Hx of ICH  2012 left BG ICH; during hospital course:  BP high at 200s  Urine positive with cocaine   S/p trach and PEG at that time  Lost follow up since  Acute respiratory failure  Intubated in ED due to airway protection  S/p trach, not able to wean off ventilation  On Risperdal and clonidine patch for agitation, scheduled ativan tapered off  Sputum culture continue to show MRSA 10/06/15  On vancomycin   Still has fever and tachycardia   CCM following  DM  A1C 6.9 on admission  Required high dose SSI  Increase lantus to 30 units  bid  CBG monitoring Q6  Glucose much improved  ? Seizure  Questionable seizure presentation initially as reported in chart  EEG showed no seizure but under sedation  Hold off AEDs at this time  No seizure activity since admission  Hypernatremia  Na 152->151->149->148->143->143 -> 138  continue free water   Hypertensive Emergency  BP as high as 202/124  off cardene drip  Stable   On clonidine patch  Hyperlipidemia  Home meds:  No statin  LDL not able to calculate due to high TG , goal < 70  Total chol 170  on lipitor 10mg   Dysphagia   Hx Dysphagia secondary to ICH in 2012 with PEG  S/p PEG  On TF  Bowel regimen  Cocaine abuse  urine positive both in 2012 and this admission  Likely one of the causes of intracranial vascular stenosis  Need cessation counseling   Other Stroke Risk Factors  Obesity  Other Active Problems  Hepatitis C  Renal Insuff.   BUN 53 / Cre 1.33 today- recheck in AM, free water   Hospital day # [id number] PA-C [facility] Pager [phone] 10/14/2015, 12:53 PM   Personally examined patient and images, and have participated in and made any corrections needed to history, physical, neuro exam,assessment and plan as stated above.  I have personally obtained the history, evaluated lab date, reviewed imaging studies and agree with radiology interpretations.   This patient is critically ill due to large left MCA infarct with left M2 occlusion s/p intervention, hemorrhagic transformation, bacteremia, acute blood loss anemia, PEG tube site bleeding, constant low-grade fever and at significant risk of neurological worsening, death form recurrent infarcts, increased ICH, brain herniation, cerebral edema, heart failure and sepsis. This patient's care requires constant monitoring of vital signs, hemodynamics, respiratory and cardiac monitoring, review of multiple databases, neurological assessment, discussion with family, other specialists and medical decision making of high complexity. I spent 30 minutes of neurocritical care time in the care of this patient.   Naomie Dean, MD Stroke Neurology [phone] [facility]           To contact Stroke Continuity provider, please refer to [email or web address]. After hours, contact General Neurology

## 2015-10-14 NOTE — Plan of Care (Signed)
Problem: Education: Goal: Knowledge of secondary prevention will improve Outcome: Progressing Talked to family about labs  Comments:  And patients condition from report

## 2015-10-15 LAB — GLUCOSE, CAPILLARY
GLUCOSE-CAPILLARY: 167 mg/dL — AB (ref 65–99)
GLUCOSE-CAPILLARY: 149 mg/dL — AB (ref 65–99)
Glucose-Capillary: 139 mg/dL — ABNORMAL HIGH (ref 65–99)
Glucose-Capillary: 146 mg/dL — ABNORMAL HIGH (ref 65–99)
Glucose-Capillary: 181 mg/dL — ABNORMAL HIGH (ref 65–99)
Glucose-Capillary: 211 mg/dL — ABNORMAL HIGH (ref 65–99)

## 2015-10-15 LAB — CULTURE, BLOOD (ROUTINE X 2)
CULTURE: NO GROWTH
CULTURE: NO GROWTH

## 2015-10-15 LAB — CBC
HCT: 25.4 % — ABNORMAL LOW (ref 36.0–46.0)
HEMOGLOBIN: 8.2 g/dL — AB (ref 12.0–15.0)
MCH: 31.2 pg (ref 26.0–34.0)
MCHC: 32.3 g/dL (ref 30.0–36.0)
MCV: 96.6 fL (ref 78.0–100.0)
PLATELETS: 436 10*3/uL — AB (ref 150–400)
RBC: 2.63 MIL/uL — AB (ref 3.87–5.11)
RDW: 14.9 % (ref 11.5–15.5)
WBC: 12.2 10*3/uL — ABNORMAL HIGH (ref 4.0–10.5)

## 2015-10-15 LAB — BASIC METABOLIC PANEL
ANION GAP: 12 (ref 5–15)
BUN: 50 mg/dL — AB (ref 6–20)
CALCIUM: 8.5 mg/dL — AB (ref 8.9–10.3)
CO2: 20 mmol/L — AB (ref 22–32)
Chloride: 108 mmol/L (ref 101–111)
Creatinine, Ser: 1.23 mg/dL — ABNORMAL HIGH (ref 0.44–1.00)
GFR calc Af Amer: 54 mL/min — ABNORMAL LOW (ref >60)
GFR, EST NON AFRICAN AMERICAN: 47 mL/min — AB (ref >60)
GLUCOSE: 165 mg/dL — AB (ref 65–99)
Potassium: 5.1 mmol/L (ref 3.5–5.1)
Sodium: 140 mmol/L (ref 135–145)

## 2015-10-15 NOTE — Plan of Care (Signed)
Problem: Education: Goal: Knowledge of secondary prevention will improve Outcome: Progressing Patient having increased secretions which were bloody.  And patient may need possible catheter due to moisture associated skin damage.

## 2015-10-15 NOTE — Progress Notes (Signed)
STROKE TEAM PROGRESS NOTE    History at Time of Admission Linda [name] is an 60 y.o. female with a history of intracerebral hemorrhage in 2012, hypertension, cocaine abuse, renal failure and hepatitis C, seen in the ED at [facility] after becoming unresponsive with leftward gaze and right facial droop area there was also concern for possible seizure activity initially. She was reportedly last known well at midnight. CT scan of her head showed no acute intracranial abnormality except for small increased density posterior left sylvian fissure. Patient was not a TPA candidate because of a history of intracerebral hemorrhage in 2012. She was transferred to [facility] for further management, including possible interventional radiology procedures. CT angiogram of left M2 segment occlusion responding to the high density abnormality seen in sylvian fissure on CT scan. Study was otherwise unremarkable. Patient was intubated in the ED at [facility] for airway protection. Urine drug screen was positive for cocaine. Her pressure was elevated at 210/127. Cardene drip IV was started.   LSN: 12:00 AM on 09/28/2015 tPA Given: No: History of ICH in 2012 mRankin:      SUBJECTIVE (INTERVAL HISTORY) No family at bedside. No events overnight, patient non verbal, stable clinically. Per Clinical Social work note: CSW received a call from [place]- Admissions from [facility] in [place]. Stated that patient has been approved clinically but will need application for [place]. She will arranged for a representative to come to the hospital next week.  Patient is increasingly edematous. Will place foley to record output. She is having bloody secretions, critical care is following, nurse to contact them and see if they can see patient.     OBJECTIVE Temp:  [98.5 F (36.9 C)-99.5 F (37.5 C)] 98.9 F (37.2 C) (02/19 0853) Pulse Rate:  [94-106] 101 (02/19 0901) Cardiac Rhythm:  [-] Sinus tachycardia (02/19  0700) Resp:  [9-43] 34 (02/19 0901) BP: (116-164)/(57-80) 125/69 mmHg (02/19 0901) SpO2:  [98 %-100 %] 100 % (02/19 0901) FiO2 (%):  [30 %] 30 % (02/19 0901) Weight:  [116 kg (255 lb 11.7 oz)] 116 kg (255 lb 11.7 oz) (02/19 0400)  CBC:   Recent Labs Lab 10/14/15 0314 10/15/15 0623  WBC 10.8* 12.2*  HGB 7.5* 8.2*  HCT 24.2* 25.4*  MCV 94.5 96.6  PLT 381 436*    Basic Metabolic Panel:   Recent Labs Lab 10/14/15 0314 10/15/15 0343  NA 138 140  K 4.5 5.1  CL 108 108  CO2 22 20*  GLUCOSE 227* 165*  BUN 53* 50*  CREATININE 1.33* 1.23*  CALCIUM 8.2* 8.5*    Lipid Panel:     Component Value Date/Time   CHOL 170 09/28/2015 0938   TRIG 245* 10/04/2015 0841   HDL 38* 09/28/2015 0938   CHOLHDL 4.5 09/28/2015 0938   VLDL UNABLE TO CALCULATE IF TRIGLYCERIDE OVER 400 mg/dL 16/05/9603 5409   LDLCALC UNABLE TO CALCULATE IF TRIGLYCERIDE OVER 400 mg/dL 81/19/1478 2956   OZHY8M:  Lab Results  Component Value Date   HGBA1C 6.9* 09/28/2015   Urine Drug Screen:     Component Value Date/Time   LABOPIA NONE DETECTED 09/28/2015 0226   LABBENZ NONE DETECTED 09/28/2015 0226   AMPHETMU NONE DETECTED 09/28/2015 0226   THCU NONE DETECTED 09/28/2015 0226   LABBARB NONE DETECTED 09/28/2015 0226      IMAGING I have personally reviewed the radiological images below and agree with the radiology interpretations.  Ct Head Wo Contrast 09/28/2015   1. New calcific density in the  left sylvian fissure, possible calcific distal M2 embolus. No visible acute infarct or hemorrhage.  2. Advanced chronic small vessel disease with multiple lacunar infarcts.   CTA NECK 09/28/2015   Moderately motion degraded examination, limited by LEFT venous contamination.  No definite acute vascular process or hemodynamically significant stenosis.  3.5 x 5.7 cm dominant LEFT thyroid not oral for which follow up thyroid sonogram is recommended on a nonemergent basis.   CTA HEAD 09/28/2015   LEFT M2 segment  occlusion corresponding to sylvian fissure density on today's CT, likely acute. Complete circle of Willis.  Mild intracranial atherosclerosis. Moderate calcific atherosclerosis the carotid siphons.   Cerebral Angiogram 09/28/2015 S/P Lt internal carotid arteriogram followed by complete revascularization of occluded dominant inferior division of LT MCA with x pass with trevoprovue 4 mmx 30 mm retrieval device and 6 mg of superselective Integrelin achieving a TICI 3 flow.  MRI  Large acute hemorrhagic left hemispheric infarct involves the majority of the left middle cerebral artery distribution. Hemorrhage is most notable involving the left posterior temporal -parietal aspect of this acute infarct. Local mass effect upon the left lateral ventricle. At most there is minimal bowing of the septum to the right. Several small acute nonhemorrhagic right hemispheric infarcts involving right caudate head, superior aspect of the post limb right internal capsule, right frontal lobe, superior right lenticular nucleus and right occipital lobe. Several small acute nonhemorrhagic cerebellar infarcts greater on the right. Remote left lenticular nucleus infarct or hematoma with blood-stained cleft now noted. Moderate to marked chronic small vessel disease changes. Atrophy.  EEG Abnormalities: 1) possible attenuation of sleep structures in the left central region 2) lack of waking state Clinical Interpretation: This EEG is consistent with a sleep/sedation EEG with a possible suggestion of a left central cerebral dysfunction, though I don't think this is definite by this study.  2D echo - Systolic function was normal. The estimated ejection fraction was in the range of 55% to 60%. Wall motion was normal; there were no regional wall motion abnormalities. Doppler parameters are consistent with abnormal left ventricular relaxation (grade 1 diastolic dysfunction).  LE venous doppler -   no DVT    Dg Chest Port 1 View 10/08/2015  IMPRESSION: Atelectasis left mid lung. Lungs elsewhere clear. No change in cardiac silhouette. Tube and catheter positions as described without pneumothorax.   10/09/2015 IMPRESSION: Slight interval improvement in the pulmonary interstitium. Persistent subsegmental atelectasis in the left mid lung. No pleural effusion or pneumothorax. The support tubes are in reasonable position.   Ct Abdomen Pelvis Wo Contrast 10/12/2015  IMPRESSION: The gastrostomy tube is appropriately positioned and there is a very small amount hemorrhage within the rectus musculature and subcutaneous fat surrounding the tube insertion site. In the lungs, bibasilar atelectasis versus airspace disease is present. Milk-of-calcium bile versus gallstones.  Ultrasound may be helpful.    Physical exam  Temp:  [98.5 F (36.9 C)-99.5 F (37.5 C)] 98.9 F (37.2 C) (02/19 0853) Pulse Rate:  [94-106] 101 (02/19 0901) Resp:  [9-43] 34 (02/19 0901) BP: (116-164)/(57-80) 125/69 mmHg (02/19 0901) SpO2:  [98 %-100 %] 100 % (02/19 0901) FiO2 (%):  [30 %] 30 % (02/19 0901) Weight:  [116 kg (255 lb 11.7 oz)] 116 kg (255 lb 11.7 oz) (02/19 0400)  Exam is stable today.   General - Well nourished, well developed, s/p trach and PEG.  Ophthalmologic - Fundi not visualized due to noncooperation.  Cardiovascular - Regular rhythm, but tachycardia. No M/R/G  Neruo -  Opens eyes to sternal rub,  Nonverbal, not following commands, grimaces to sternal run and pain, s/p trach and peg, on ventilation. Not following any commands. PERRL, left gaze preference, Positive corneal and gag. Right facial droop. tongue in middle inside mouth. Spontaneous movement LUE and LLE on pain stimulation. RUE and RLE withdraws less than left side to pain, with increased tone. DTR 1+, toes equiv.. Sensation, gait and coordination not tested.   ASSESSMENT/PLAN Linda [name] is a 60 y.o. female with history of left BG ICH in  2012, hypertension, cocaine abuse, renal failure and hepatitis C presenting with altered mental status, right side weakness and leftward gaze. Also concerning for possible seizure activity initial presentation. She did not receive IV t-PA due to hx ICH, CTA showed a L M2 occlusion, she was sent to IR where she received complete TICI3 revascularization of the L MCA with mechanical thrombectomy with trevo and IA Integrilin.  Stroke:  left MCA infarct with hemorrhagic transformation s/p revascularizationf of L M2, multifocal scattered infarcts involving bilateral posterior and anterior circulation, consistent with cardioembolic source although pt does have significant large vessel intracranial atherosclerosis as well as cocaine abuse.  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  CTA neck L thyroid nodule  2D Echo  EF 55-60%  LE venous doppler negative for DVT  LDL unable to calculate due to high TG   HgbA1c 6.9  UDS positive for cocaine  Heparin subq for VTE prophylaxis Diet NPO time specified  No antithrombotic prior to admission, now on ASA 325mg .   Ongoing aggressive stroke risk factor management  Therapy recommendations:  SNF with vent support  Disposition:  pending   Palliative care consulted for goal of care - pending  Bacteremia   Positive blood Cx 10/05/15 G+ in cluster with coagulase negative 2/2  Leukocytosis WBC 16.2->16.8->14.8 -> 14.0-> 13.9->16.5->16.7 -> 10.8 -> 12.2  Sensitive to vanco  Central line placed  ID recs appreciated, recommend line holidays.   Will change IJ to PICC, CCM aware  Presumed  endocarditis but hold off further work up due to limited functional improvement so far and likely poor prognosis  Anemia   Hb 8.3-6.8->8.3->8.8 -> 7.9 -> 7.5 -> 8.2  S/p blood transfusion  Likely due to PEG tube site bleeding  Trauma Team on board, appreciate their help  Stool guaiac  (negative 10/12/2015)  Hx of ICH  2012 left BG ICH; during hospital course:  BP high at 200s  Urine positive with cocaine   S/p trach and PEG at that time  Lost follow up since  Acute respiratory failure  Intubated in ED due to airway protection  S/p trach, not able to wean off ventilation  On Risperdal and clonidine patch for agitation, scheduled ativan tapered off  Sputum culture continue to show MRSA 10/06/15  On vancomycin   CCM following  DM  A1C 6.9 on admission  Required high dose SSI  Increase lantus to 30 units bid  CBG monitoring Q6  Glucose much improved  ? Seizure  Questionable seizure presentation initially as reported in chart  EEG showed no seizure but under sedation  Hold off AEDs at this time  No seizure activity since admission  Hypernatremia  Na 152->151->149->148->143->143 -> 138 ->140  Free water increased yesterday, CCM has not seen her today pending. Will follow their recs.   Hypertensive Emergency  BP as high as 202/124  off cardene drip  Stable   On clonidine patch  Hyperlipidemia  Home meds:  No statin  LDL not able to calculate due to high TG , goal < 70  Total chol 170  on lipitor   Dysphagia   Hx Dysphagia secondary to ICH in 2012 with PEG  S/p PEG  On TF  Bowel regimen  Cocaine abuse  urine positive both in 2012 and this admission  Likely one of the causes of intracranial vascular stenosis  Need cessation counseling   Other Stroke Risk Factors  Obesity  Other Active Problems  Hepatitis C  Renal Insuff.   BUN 50 / Cre 1.23 Sunday  Thyroid abnormality - f/u recommended.  Edematous today,  place foley and monitor output. May want to decrease input.  New decub ulcer on the buttocks, dressed today. If worsens needs would consult  Will ask CCM to see her today.    Hospital day # [address] PA-C [facility] Pager [phone] 10/15/2015, 11:55 AM   Personally examined patient and images, and have participated in and made any corrections needed to history, physical, neuro exam,assessment and plan as stated above.  I have personally obtained the history, evaluated lab date, reviewed imaging studies and agree with radiology interpretations.   This patient is critically ill due to large left MCA infarct with left M2 occlusion s/p intervention, hemorrhagic transformation, bacteremia, acute blood loss anemia, PEG tube site bleeding, acute resp failure s/p trach, hypervolemia, renal insufficiency,  new decubitus ulcers,  and at significant risk of neurological worsening, death form recurrent infarcts, increased ICH, brain herniation, cerebral edema, heart failure and sepsis. This patient's care requires constant monitoring of vital signs, hemodynamics, respiratory and cardiac monitoring, review of multiple databases, neurological assessment, discussion with family, other specialists and medical decision making of high complexity. I spent 30 minutes of neurocritical care time in the care of this patient.   Naomie Dean, MD Stroke Neurology [phone] [facility]           To contact Stroke Continuity provider, please refer to [email or web address]. After hours, contact General Neurology

## 2015-10-16 DIAGNOSIS — Z515 Encounter for palliative care: Secondary | ICD-10-CM | POA: Insufficient documentation

## 2015-10-16 DIAGNOSIS — Z9911 Dependence on respirator [ventilator] status: Secondary | ICD-10-CM

## 2015-10-16 DIAGNOSIS — J9610 Chronic respiratory failure, unspecified whether with hypoxia or hypercapnia: Secondary | ICD-10-CM

## 2015-10-16 DIAGNOSIS — Z7189 Other specified counseling: Secondary | ICD-10-CM | POA: Insufficient documentation

## 2015-10-16 LAB — BASIC METABOLIC PANEL
Anion gap: 10 (ref 5–15)
BUN: 48 mg/dL — AB (ref 6–20)
CHLORIDE: 108 mmol/L (ref 101–111)
CO2: 23 mmol/L (ref 22–32)
CREATININE: 1.26 mg/dL — AB (ref 0.44–1.00)
Calcium: 8.5 mg/dL — ABNORMAL LOW (ref 8.9–10.3)
GFR calc Af Amer: 53 mL/min — ABNORMAL LOW (ref >60)
GFR calc non Af Amer: 45 mL/min — ABNORMAL LOW (ref >60)
GLUCOSE: 169 mg/dL — AB (ref 65–99)
POTASSIUM: 4.3 mmol/L (ref 3.5–5.1)
SODIUM: 141 mmol/L (ref 135–145)

## 2015-10-16 LAB — GLUCOSE, CAPILLARY
GLUCOSE-CAPILLARY: 134 mg/dL — AB (ref 65–99)
GLUCOSE-CAPILLARY: 144 mg/dL — AB (ref 65–99)
GLUCOSE-CAPILLARY: 94 mg/dL (ref 65–99)
Glucose-Capillary: 138 mg/dL — ABNORMAL HIGH (ref 65–99)
Glucose-Capillary: 115 mg/dL — ABNORMAL HIGH (ref 65–99)
Glucose-Capillary: 125 mg/dL — ABNORMAL HIGH (ref 65–99)

## 2015-10-16 NOTE — Progress Notes (Addendum)
Name: Linda [name] MRN: [id number] DOB: October 30, 1955    ADMISSION DATE:  09/28/2015 CONSULTATION DATE: 09/28/15  REFERRING MD :  Neuro  CHIEF COMPLAINT:  Obtunded   CULTURES: 2/2 Blood >> negative 2/2 Urine >> negative 2/2 Sputum >> oral flora 2/4 Sputum >> MRSA 2/4 Blood >> Coag neg Staph 2/9 Blood >>GPC>> 2/10 Sputum >>MRSA 2/12 repeat sputum ulture MRSA  STUDIES:  EEG 2/02 >> consistent with a sleep/sedation EEG with a possible suggestion of a left central cerebral dysfunction Echo 2/03 >> EF 55 to 60%, grade 1 diastolic dysfx Doppler legs b/l 2/04 >> no DVT   Subjective :  10/16/15:Patient does not follow any command, Is on  trached and is onVent support. No significant changes overnight.  Vital signs  Temp:  [98.5 F (36.9 C)-101.5 F (38.6 C)] 98.5 F (36.9 C) (02/20 0816) Pulse Rate:  [84-115] 101 (02/20 0816) Resp:  [7-37] 29 (02/20 0816) BP: (103-135)/(54-76) 127/59 mmHg (02/20 0816) SpO2:  [98 %-100 %] 100 % (02/20 0816) FiO2 (%):  [30 %] 30 % (02/20 0816) Weight:  [114.6 kg (252 lb 10.4 oz)] 114.6 kg (252 lb 10.4 oz) (02/20 0418)   PHYSICAL EXAMINATION: General:   On ventilator, opens eyes spontaneously,does not follow any commands, grimaces with pain Neuro:  Opens eyes, Right side flaccid HEENT: Blood tinged secretions noted on the trach site Cardiovascular: S1,S2 heart sound. RRR, No MRG noted Lungs:  Coarse on auscultation, no accessory muscle use Abdomen: obese, round. Bowel sounds present Musculoskeletal:  Rt side flaccid Skin:  No rash, ecchymosis noted   Recent Labs Lab 10/14/15 0314 10/15/15 0343 10/16/15 0514  NA 138 140 141  K 4.5 5.1 4.3  CL 108 108 108  CO2 22 20* 23  BUN 53* 50* 48*  CREATININE 1.33* 1.23* 1.26*  GLUCOSE 227* 165* 169*    Recent Labs Lab 10/13/15 1040 10/14/15 0314 10/15/15 0623  HGB 7.9* 7.5* 8.2*  HCT 24.4* 24.2* 25.4*  WBC 15.0* 10.8* 12.2*  PLT 381 381 436*   No results found.  ASSESSMENT /  PLAN:  Acute encephalopathy 2nd to Lt MCA CVA and cocaine. Continue ASA Continue Lorazepam for Agitation. Continue Resperidal  Continue Hydralazine and clonidine as needed for BP goal <160  Compromised airway in setting of CVA. Failure to wean from vent s/p tracheostomy 2/07. Pressure support wean as able. Needs vent SNF. F/u CXR  Leukocytosis  Trend WBC  Vanc as dosed by pharmacy   Wound care  Per wound nurse  CKD Stage-2,   improved Azotemia  Na is baseline Is volume overloaded need to be diuresed   B [place] APP Pulmonary and Critical Care Medicine [place] Pager: [phone]   STAFF NOTE: I, Dr Lavinia Sharps have personally reviewed patient's available data, including medical history, events of note, physical examination and test results as part of my evaluation. I have discussed with resident/NP and other care providers such as pharmacist, RN and RRT.  In addition,  I personally evaluated patient and elicited key findings of   S: no real change  O: unresponsive, sync with vent, tachypneic  A: chronic ven dependent resp failure following stroke  P: will be vent dependent and depends on neuro outocme which reading notes sounds poor   .  Rest per NP/medical resident whose note is outlined above and that I agree with   Dr. Kalman Shan, M.D., [facility].C.P Pulmonary and Critical Care Medicine Staff Physician [facility] [place] [facility]  Pager: [phone], If no answer or between  15:00h - 7:00h: call [phone]  10/16/2015 11:19 AM        10/16/2015, 9:46 AM

## 2015-10-16 NOTE — Progress Notes (Signed)
Dr. Dellie Catholic notified regarding PEG site. States he will get the P.A to see. Ok to turn off tube feeding for now.

## 2015-10-16 NOTE — Progress Notes (Signed)
Noted lge amt of thick secretions;  mucousy at the PEG site and the dressing was saturated. Noted PEG tube to be partially dislodged. Tube feeding turned off at this time.

## 2015-10-16 NOTE — Progress Notes (Addendum)
[facility] will meet with pt sister at 3:30pm on Wednesday to complete [place] Medicaid paperwork.  Medicaid paperwork must be processed by [facility] prior to admission.    Anticipate facility will be able to accept the patient early next week if patient is medically stable.  CSW will continue to follow  Merlyn Lot, [facility] Clinical Social Worker [phone]

## 2015-10-16 NOTE — Progress Notes (Signed)
STROKE TEAM PROGRESS NOTE    History at Time of Admission Linda [name] is an 60 y.o. female with a history of intracerebral hemorrhage in 2012, hypertension, cocaine abuse, renal failure and hepatitis C, seen in the ED at [facility] after becoming unresponsive with leftward gaze and right facial droop area there was also concern for possible seizure activity initially. She was reportedly last known well at midnight. CT scan of her head showed no acute intracranial abnormality except for small increased density posterior left sylvian fissure. Patient was not a TPA candidate because of a history of intracerebral hemorrhage in 2012. She was transferred to [facility] for further management, including possible interventional radiology procedures. CT angiogram of left M2 segment occlusion responding to the high density abnormality seen in sylvian fissure on CT scan. Study was otherwise unremarkable. Patient was intubated in the ED at [facility] for airway protection. Urine drug screen was positive for cocaine. Her pressure was elevated at 210/127. Cardene drip IV was started.   LSN: 12:00 AM on 09/28/2015 tPA Given: No: History of ICH in 2012 mRankin:      SUBJECTIVE (INTERVAL HISTORY) No family at bedside. No events overnight, patient non verbal, stable clinically. Palliative care consult is pending. Patient's application for Medicaid [place] is also pending    OBJECTIVE Temp:  [98.5 F (36.9 C)-101.5 F (38.6 C)] 99.6 F (37.6 C) (02/20 1129) Pulse Rate:  [84-112] 95 (02/20 1639) Cardiac Rhythm:  [-] Sinus tachycardia (02/20 0700) Resp:  [17-36] 27 (02/20 1639) BP: (103-147)/(54-74) 136/74 mmHg (02/20 1639) SpO2:  [98 %-100 %] 99 % (02/20 1639) FiO2 (%):  [30 %] 30 % (02/20 1639) Weight:  [252 lb 10.4 oz (114.6 kg)] 252 lb 10.4 oz (114.6 kg) (02/20 0418)  CBC:   Recent Labs Lab 10/14/15 0314 10/15/15 0623  WBC 10.8* 12.2*  HGB 7.5* 8.2*  HCT 24.2* 25.4*  MCV 94.5 96.6  PLT 381 436*     Basic Metabolic Panel:   Recent Labs Lab 10/15/15 0343 10/16/15 0514  NA 140 141  K 5.1 4.3  CL 108 108  CO2 20* 23  GLUCOSE 165* 169*  BUN 50* 48*  CREATININE 1.23* 1.26*  CALCIUM 8.5* 8.5*    Lipid Panel:     Component Value Date/Time   CHOL 170 09/28/2015 0938   TRIG 245* 10/04/2015 0841   HDL 38* 09/28/2015 0938   CHOLHDL 4.5 09/28/2015 0938   VLDL UNABLE TO CALCULATE IF TRIGLYCERIDE OVER 400 mg/dL 16/05/9603 5409   LDLCALC UNABLE TO CALCULATE IF TRIGLYCERIDE OVER 400 mg/dL 81/19/1478 2956   OZHY8M:  Lab Results  Component Value Date   HGBA1C 6.9* 09/28/2015   Urine Drug Screen:     Component Value Date/Time   LABOPIA NONE DETECTED 09/28/2015 0226   LABBENZ NONE DETECTED 09/28/2015 0226   AMPHETMU NONE DETECTED 09/28/2015 0226   THCU NONE DETECTED 09/28/2015 0226   LABBARB NONE DETECTED 09/28/2015 0226      IMAGING I have personally reviewed the radiological images below and agree with the radiology interpretations.  Ct Head Wo Contrast 09/28/2015   1. New calcific density in the left sylvian fissure, possible calcific distal M2 embolus. No visible acute infarct or hemorrhage.  2. Advanced chronic small vessel disease with multiple lacunar infarcts.   CTA NECK 09/28/2015   Moderately motion degraded examination, limited by LEFT venous contamination.  No definite acute vascular process or hemodynamically significant stenosis.  3.5 x 5.7 cm dominant LEFT thyroid not oral for which follow  up thyroid sonogram is recommended on a nonemergent basis.   CTA HEAD 09/28/2015   LEFT M2 segment occlusion corresponding to sylvian fissure density on today's CT, likely acute. Complete circle of Willis.  Mild intracranial atherosclerosis. Moderate calcific atherosclerosis the carotid siphons.   Cerebral Angiogram 09/28/2015 S/P Lt internal carotid arteriogram followed by complete revascularization of occluded dominant inferior division of LT MCA with x pass with  trevoprovue 4 mmx 30 mm retrieval device and 6 mg of superselective Integrelin achieving a TICI 3 flow.  MRI  Large acute hemorrhagic left hemispheric infarct involves the majority of the left middle cerebral artery distribution. Hemorrhage is most notable involving the left posterior temporal -parietal aspect of this acute infarct. Local mass effect upon the left lateral ventricle. At most there is minimal bowing of the septum to the right. Several small acute nonhemorrhagic right hemispheric infarcts involving right caudate head, superior aspect of the post limb right internal capsule, right frontal lobe, superior right lenticular nucleus and right occipital lobe. Several small acute nonhemorrhagic cerebellar infarcts greater on the right. Remote left lenticular nucleus infarct or hematoma with blood-stained cleft now noted. Moderate to marked chronic small vessel disease changes. Atrophy.  EEG Abnormalities: 1) possible attenuation of sleep structures in the left central region 2) lack of waking state Clinical Interpretation: This EEG is consistent with a sleep/sedation EEG with a possible suggestion of a left central cerebral dysfunction, though I don't think this is definite by this study.  2D echo - Systolic function was normal. The estimated ejection fraction was in the range of 55% to 60%. Wall motion was normal; there were no regional wall motion abnormalities. Doppler parameters are consistent with abnormal left ventricular relaxation (grade 1 diastolic dysfunction).  LE venous doppler -   no DVT   Dg Chest Port 1 View 10/08/2015  IMPRESSION: Atelectasis left mid lung. Lungs elsewhere clear. No change in cardiac silhouette. Tube and catheter positions as described without pneumothorax.   10/09/2015 IMPRESSION: Slight interval improvement in the pulmonary interstitium. Persistent subsegmental atelectasis in the left mid lung. No pleural effusion or pneumothorax. The  support tubes are in reasonable position.   Ct Abdomen Pelvis Wo Contrast 10/12/2015  IMPRESSION: The gastrostomy tube is appropriately positioned and there is a very small amount hemorrhage within the rectus musculature and subcutaneous fat surrounding the tube insertion site. In the lungs, bibasilar atelectasis versus airspace disease is present. Milk-of-calcium bile versus gallstones.  Ultrasound may be helpful.    Physical exam  Temp:  [98.5 F (36.9 C)-101.5 F (38.6 C)] 99.6 F (37.6 C) (02/20 1129) Pulse Rate:  [84-112] 95 (02/20 1639) Resp:  [17-36] 27 (02/20 1639) BP: (103-147)/(54-74) 136/74 mmHg (02/20 1639) SpO2:  [98 %-100 %] 99 % (02/20 1639) FiO2 (%):  [30 %] 30 % (02/20 1639) Weight:  [252 lb 10.4 oz (114.6 kg)] 252 lb 10.4 oz (114.6 kg) (02/20 0418)  Exam is stable today.   General - Well nourished, well developed, s/p trach and PEG.  Ophthalmologic - Fundi not visualized due to noncooperation.  Cardiovascular - Regular rhythm, but tachycardia. No M/R/G  Neruo - Opens eyes to sternal rub,  Nonverbal, not following commands, grimaces to sternal run and pain, s/p trach and peg, on ventilation. Not following any commands. PERRL, left gaze preference, Positive corneal and gag. Right facial droop. tongue in middle inside mouth. Spontaneous movement LUE and LLE on pain stimulation. RUE and RLE withdraws less than left side to pain, with increased tone. DTR  1+, toes equiv.. Sensation, gait and coordination not tested.   ASSESSMENT/PLAN Linda [name] is a 60 y.o. female with history of left BG ICH in 2012, hypertension, cocaine abuse, renal failure and hepatitis C presenting with altered mental status, right side weakness and leftward gaze. Also concerning for possible seizure activity initial presentation. She did not receive IV t-PA due to hx ICH, CTA showed a L M2 occlusion, she was sent to IR where she received complete TICI3 revascularization of the L MCA with  mechanical thrombectomy with trevo and IA Integrilin.  Stroke:  left MCA infarct with hemorrhagic transformation s/p revascularizationf of L M2, multifocal scattered infarcts involving bilateral posterior and anterior circulation, consistent with cardioembolic source although pt does have significant large vessel intracranial atherosclerosis as well as cocaine abuse.  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  CTA neck L thyroid nodule  2D Echo  EF 55-60%  LE venous doppler negative for DVT  LDL unable to calculate due to high TG   HgbA1c 6.9  UDS positive for cocaine  Heparin subq for VTE prophylaxis Diet NPO time specified  No antithrombotic prior to admission, now on ASA .   Ongoing aggressive stroke risk factor management  Therapy recommendations:  SNF with vent support  Disposition:  pending   Palliative care consulted for goal of care - pending  Bacteremia   Positive blood Cx 10/05/15 G+ in cluster with coagulase negative 2/2  Leukocytosis WBC 16.2->16.8->14.8 -> 14.0-> 13.9->16.5->16.7 -> 10.8 -> 12.2  Sensitive to vanco  Central line placed  ID recs appreciated, recommend line holidays.   Will change IJ to PICC, CCM aware  Presumed endocarditis but hold off further work up due to limited functional improvement so far and likely poor prognosis  Anemia   Hb 8.3-6.8->8.3->8.8 -> 7.9 -> 7.5 -> 8.2  S/p blood transfusion  Likely due to PEG tube site bleeding  Trauma Team on board, appreciate their help  Stool guaiac  (negative 10/12/2015)  Hx of ICH  2012 left BG ICH; during hospital  course:  BP high at 200s  Urine positive with cocaine   S/p trach and PEG at that time  Lost follow up since  Acute respiratory failure  Intubated in ED due to airway protection  S/p trach, not able to wean off ventilation  On Risperdal and clonidine patch for agitation, scheduled ativan tapered off  Sputum culture continue to show MRSA 10/06/15  On vancomycin   CCM following  DM  A1C 6.9 on admission  Required high dose SSI  Increase lantus to 30 units bid  CBG monitoring Q6  Glucose much improved  ? Seizure  Questionable seizure presentation initially as reported in chart  EEG showed no seizure but under sedation  Hold off AEDs at this time  No seizure activity since admission  Hypernatremia  Na 152->151->149->148->143->143 -> 138 ->140  Free water increased yesterday, CCM has not seen her today pending. Will follow their recs.   Hypertensive Emergency  BP as high as 202/124  off cardene drip  Stable   On clonidine patch  Hyperlipidemia  Home meds:  No statin  LDL not able to calculate  due to high TG , goal < 70  Total chol 170  on lipitor 10mg   Dysphagia   Hx Dysphagia secondary to ICH in 2012 with PEG  S/p PEG  On TF  Bowel regimen  Cocaine abuse  urine positive both in 2012 and this admission  Likely one of the causes of intracranial vascular stenosis  Need cessation counseling   Other Stroke Risk Factors  Obesity  Other Active Problems  Hepatitis C  Renal Insuff.   BUN 50 / Cre 1.23 Sunday  Thyroid abnormality - f/u recommended.  Edematous today, place foley and monitor output. May want to decrease input.  New decub ulcer on the buttocks, dressed y`day clinically cannot determine stage    Appreciate CCM CCM follow-up    Hospital day # 18           Personally examined patient and images, and have participated in and made any corrections needed to history, physical, neuro exam,assessment and plan as  stated above.  I have personally obtained the history, evaluated lab date, reviewed imaging studies and agree with radiology interpretations.   This patient is critically ill due to large left MCA infarct with left M2 occlusion s/p intervention, hemorrhagic transformation, bacteremia, acute blood loss anemia, PEG tube site bleeding, acute resp failure s/p trach, hypervolemia, renal insufficiency,  new decubitus ulcers,  and at significant risk of neurological worsening, death form recurrent infarcts, increased ICH, brain herniation, cerebral edema, heart failure and sepsis. This patient's care requires constant monitoring of vital signs, hemodynamics, respiratory and cardiac monitoring, review of multiple databases, neurological assessment, discussion with family, other specialists and medical decision making of high complexity. I spent 30 minutes of neurocritical care time in the care of this patient.   Delia Heady, MD Stroke Neurology [phone] [facility]           To contact Stroke Continuity provider, please refer to [email or web address]. After hours, contact General Neurology

## 2015-10-16 NOTE — Consult Note (Signed)
Consultation Note Date: 10/16/2015   Patient Name: Linda [name]  DOB: 10-10-1955  MRN: [id number]  Age / Sex: 60 y.o., female  PCP: [facility] Referring Physician: Micki Riley, MD  Reason for Consultation: Establishing goals of care  60 yo female with history of cocaine abuse and hemorrhagic CVA in 2012 necessitating trach/peg.  Patient improved. She was admitted on 2/2 with a new extensive left sided MCA CVA.  She could not be weaned from the vent.  Janina Mayo was placed 2/7.  Peg in place.  She developed HCAP and has bacteremia as well as MRSA in her respiratory cultures.  Full code.  Family reportedly unrealistic.  They feel she will improve from this as she did the last time.    I contacted the patient's sister - Magdalene Tardiff, who directed me to the patient's niece, Garlon Hatchet.  Galen Daft and Liborio Nixon are willing to meet.  Liborio Nixon has to travel from [place] and take off work for [employer].  She is coming to [facility] on Wednesday 2/22 to meet with an LTAC from [place] who is evaluating the patient for admission.  She would like to meet with PMT on Wednesday (as well to avoid 2 trips) at 3:30.  From EPIC notes it appears that Critical care and Neurology are supportive of a Palliative approach.   Primary Decision Maker: Sister Jolena Kittle) and Niece Para March [place]).   SUMMARY OF RECOMMENDATIONS  Code Status/Advance Care Planning: Full code  Additional Recommendations: Patient clearly appears appropriate for comfort care.  It is a question of whether or not she would want to live this way.  Prognosis: Unable to determine.  Based on families decisions.  Discharge Planning: Likely to LTAC   Chief Complaint/ Primary Diagnoses: Present on Admission:  . CVA (cerebral infarction) . HTN (hypertension) . Cocaine abuse . Acute respiratory failure with hypoxia (HCC)  I have reviewed the medical  record, interviewed the patient and family, and examined the patient. The following aspects are pertinent.  Past Medical History  Diagnosis Date  . Hep C w/o coma, chronic (HCC)   . Cocaine abuse   . Acute renal failure (HCC)   . Hypertension   . Stroke [facility]) 2012    hemorrhagic   Social History   Social History  . Marital Status: Single    Spouse Name: N/A  . Number of Children: N/A  . Years of Education: N/A   Social History Main Topics  . Smoking status: Never Smoker   . Smokeless tobacco: None  . Alcohol Use: No  . Drug Use: Yes    Special: Cocaine  . Sexual Activity: Not Asked   Other Topics Concern  . None   Social History Narrative   History reviewed. No pertinent family history. Scheduled Meds: .  stroke: mapping our early stages of recovery book   Does not apply Once  . antiseptic oral rinse  7 mL Mouth Rinse 10 times per day  . aspirin  325 mg Per Tube Daily  . atorvastatin  10 mg Oral q1800  . chlorhexidine gluconate  15 mL Mouth Rinse BID  . cloNIDine  0.1 mg Transdermal Weekly  . docusate  50 mg Oral BID  . feeding supplement (PRO-STAT SUGAR FREE 64)  30 mL Per Tube BID  . free water  300 mL Per Tube Q4H  . heparin subcutaneous  5,000 Units Subcutaneous 3 times per day  . insulin aspart  0-20 Units Subcutaneous 6 times per day  .  insulin glargine  30 Units Subcutaneous BID  . multivitamin with minerals  1 tablet Per Tube Daily  . pantoprazole sodium  40 mg Per Tube QHS  . piperacillin-tazobactam (ZOSYN)  IV  3.375 g Intravenous 3 times per day  . risperiDONE  2 mg Oral Q12H  . sodium chloride flush  10-40 mL Intracatheter Q12H  . vancomycin  500 mg Intravenous Q12H   Continuous Infusions: . feeding supplement (VITAL HIGH PROTEIN) 1,000 mL (10/15/15 1738)   PRN Meds:.acetaminophen (TYLENOL) oral liquid 160 mg/5 mL, bisacodyl, fentaNYL (SUBLIMAZE) injection, hydrALAZINE, LORazepam, ondansetron (ZOFRAN) IV, sodium chloride flush Medications Prior to  Admission:  Prior to Admission medications   Medication Sig Start Date End Date Taking? Authorizing Provider  acetaminophen (TYLENOL) 500 MG tablet Take 500 mg by mouth every 8 (eight) hours as needed. pain   Yes Historical Provider, MD  albuterol (PROVENTIL HFA;VENTOLIN HFA) 108 (90 Base) MCG/ACT inhaler Inhale 1 puff into the lungs every 6 (six) hours as needed for wheezing or shortness of breath.   Yes Historical Provider, MD  amitriptyline (ELAVIL) 50 MG tablet Take 75 mg by mouth at bedtime.   Yes Historical Provider, MD  amLODipine (NORVASC) 10 MG tablet Take 10 mg by mouth daily.   Yes Historical Provider, MD  atorvastatin (LIPITOR) 40 MG tablet Take 80 mg by mouth daily.   Yes Historical Provider, MD  citalopram (CELEXA) 20 MG tablet Take 20 mg by mouth daily.   Yes Historical Provider, MD  clonazePAM (KLONOPIN) 0.5 MG tablet Take 0.5 mg by mouth 2 (two) times daily as needed. For anxiety   Yes Historical Provider, MD  ergocalciferol (VITAMIN D2) 50000 units capsule Take 50,000 Units by mouth once a week.   Yes Historical Provider, MD  Insulin Glargine (LANTUS SOLOSTAR) 100 UNIT/ML Solostar Pen Inject 60 Units into the skin every evening.   Yes Historical Provider, MD  lisinopril-hydrochlorothiazide (PRINZIDE,ZESTORETIC) 20-12.5 MG tablet Take 1 tablet by mouth daily.   Yes Historical Provider, MD  pantoprazole (PROTONIX) 40 MG tablet Take 40 mg by mouth daily. Reported on 09/28/2015   Yes Historical Provider, MD   No Known Allergies  Review of Systems:  Unable  Physical Exam  Well developed, obese female.  Unable to respond verbally.  Does not follow command Resp:  Gurgly breath sounds.  Trach in place CV irreg but reg rate Abd:  Peg in place, no erythema. Ext.  Moving left upper and lower extremity.  1-2+ edema in all 4 extremities.  Vital Signs: BP 147/70 mmHg  Pulse 104  Temp(Src) 98.5 F (36.9 C) (Axillary)  Resp 29  Ht  (1.702 m)  Wt 114.6 kg (252 lb 10.4 oz)  BMI  39.56 kg/m2  SpO2 100%  SpO2: SpO2: 100 % O2 Device:SpO2: 100 % O2 Flow Rate: .   IO: Intake/output summary:  Intake/Output Summary (Last 24 hours) at 10/16/15 1152 Last data filed at 10/16/15 0900  Gross per 24 hour  Intake 1202.25 ml  Output   1800 ml  Net -597.75 ml    LBM: Last BM Date: 10/15/15 Baseline Weight: Weight: 105.1 kg (231 lb 11.3 oz) Most recent weight: Weight: 114.6 kg (252 lb 10.4 oz)      Palliative Assessment/Data:  Flowsheet Rows        Most Recent Value   Intake Tab    Referral Department  Neurology   Unit at Time of Referral  Intermediate Care Unit   Palliative Care Primary Diagnosis  Neurology  Date Notified  10/13/15   Palliative Care Type  New Palliative care   Reason for referral  Clarify Goals of Care   Date of Admission  09/28/15   Date first seen by Palliative Care  10/16/15   # of days Palliative referral response time  3 Day(s)   # of days IP prior to Palliative referral  15   Clinical Assessment    Palliative Performance Scale Score  10%   Psychosocial & Spiritual Assessment    Palliative Care Outcomes    Patient/Family meeting held?  No      Additional Data Reviewed:  CBC:    Component Value Date/Time   WBC 12.2* 10/15/2015 0623   WBC 6.2 02/28/2014 0640   HGB 8.2* 10/15/2015 0623   HGB 11.7* 02/28/2014 0640   HCT 25.4* 10/15/2015 0623   HCT 33.7* 02/28/2014 0640   PLT 436* 10/15/2015 0623   PLT 235 02/28/2014 0640   MCV 96.6 10/15/2015 0623   MCV 91 02/28/2014 0640   NEUTROABS 6.1 09/29/2015 0420   LYMPHSABS 4.3* 09/29/2015 0420   MONOABS 0.9 09/29/2015 0420   EOSABS 0.0 09/29/2015 0420   BASOSABS 0.0 09/29/2015 0420   Comprehensive Metabolic Panel:    Component Value Date/Time   NA 141 10/16/2015 0514   NA 139 02/28/2014 0640   K 4.3 10/16/2015 0514   K 3.9 02/28/2014 0640   CL 108 10/16/2015 0514   CL 105 02/28/2014 0640   CO2 23 10/16/2015 0514   CO2 29 02/28/2014 0640   BUN 48* 10/16/2015 0514   BUN 14  02/28/2014 0640   CREATININE 1.26* 10/16/2015 0514   CREATININE 1.27 02/28/2014 0640   GLUCOSE 169* 10/16/2015 0514   GLUCOSE 206* 02/28/2014 0640   CALCIUM 8.5* 10/16/2015 0514   CALCIUM 8.5 02/28/2014 0640   AST 31 10/12/2015 0240   AST 78* 04/10/2012 1714   ALT 17 10/12/2015 0240   ALT 59 04/10/2012 1714   ALKPHOS 45 10/12/2015 0240   ALKPHOS 141* 04/10/2012 1714   BILITOT 0.8 10/12/2015 0240   BILITOT 0.4 04/10/2012 1714   PROT 5.6* 10/12/2015 0240   PROT 9.0* 04/10/2012 1714   ALBUMIN 1.9* 10/12/2015 0240   ALBUMIN 3.7 04/10/2012 1714     Time In: 10:00 Time Out: 11:00 Time Total: 60 min. Greater than 50%  of this time was spent counseling and coordinating care related to the above assessment and plan.  Signed by:  Algis Downs, PA-C Palliative Medicine Pager: [phone]    10/16/2015, 11:52 AM  Please contact Palliative Medicine Team phone at [phone] for questions and concerns.

## 2015-10-16 NOTE — Plan of Care (Signed)
Problem: Pain Managment: Goal: General experience of comfort will improve Outcome: Progressing Patient responding to pain in both lower exterimities

## 2015-10-17 LAB — GLUCOSE, CAPILLARY
GLUCOSE-CAPILLARY: 102 mg/dL — AB (ref 65–99)
GLUCOSE-CAPILLARY: 105 mg/dL — AB (ref 65–99)
GLUCOSE-CAPILLARY: 109 mg/dL — AB (ref 65–99)
GLUCOSE-CAPILLARY: 114 mg/dL — AB (ref 65–99)
GLUCOSE-CAPILLARY: 119 mg/dL — AB (ref 65–99)
GLUCOSE-CAPILLARY: 97 mg/dL (ref 65–99)

## 2015-10-17 LAB — VANCOMYCIN, TROUGH: VANCOMYCIN TR: 17 ug/mL (ref 10.0–20.0)

## 2015-10-17 MED ORDER — SODIUM CHLORIDE 0.9 % IV SOLN
INTRAVENOUS | Status: DC
  Administered 2015-10-17: 75 mL/h via INTRAVENOUS
  Administered 2015-10-19: 1000 mL via INTRAVENOUS
  Administered 2015-10-25: 01:00:00 via INTRAVENOUS

## 2015-10-17 NOTE — Progress Notes (Signed)
Pharmacy Antibiotic Note  59 YOF continues on day #8/42 of abx for CoNS bacteremia, MRSA PNA. Day #1 = 2/14. ID rec treating for presumed endocarditis for 6 wks from 2/14 if blood cx remains negative.  Zosyn was added for infected rectus hematoma - Afebrile, WBC 12.2. SCr stable at 1.26, CrCl ~1ml/min.  A Vanc trough drawn today was therapeutic at 17.   Plan: Continue Zosyn 3.375gm IV Q8H, 4 hr infusion Continue vancomycin  IV Q12H Monitor clinical picture, renal function, VT at new Css F/U C&S, abx deescalation / LOT  Height:  (170.2 cm) Weight: 249 lb 1.9 oz (113 kg) IBW/kg (Calculated) : 61.6  Temp (24hrs), Avg:99.6 F (37.6 C), Min:98.9 F (37.2 C), Max:99.9 F (37.7 C)   Recent Labs Lab 10/11/15 1729 10/12/15 0240 10/13/15 1040 10/14/15 0314 10/14/15 1323 10/15/15 0343 10/15/15 0623 10/16/15 0514 10/17/15 0759  WBC 16.5* 16.7* 15.0* 10.8*  --   --  12.2*  --   --   CREATININE  --  1.21* 1.18* 1.33*  --  1.23*  --  1.26*  --   VANCOTROUGH  --   --   --   --  27*  --   --   --  17    Estimated Creatinine Clearance: 61.6 mL/min (by C-G formula based on Cr of 1.26).    No Known Allergies  Antimicrobials this admission: Vanc 2/7 >> Ceftaz 2/10 >> 2/12 Zosyn 2/17 >>  Dose adjustments this admission: 2/11 VT = 13 on  IV Q24 2/14 VT = 21 on  IV Q12 (Drawn ~1 hr early but ok) 2/18 VT = 27 on  IV Q12 (Drawn about 1 hr early but true trough will still be elevated) 2/21 VT = 17 on 500 mg IV Q 12   Microbiology results: 2/14 Blood - NEG  2/10 TA - MRSA 2/9 Blood - NGTD 2/4 Blood 2/2 CoNS 2/4 TA - MRSA 2/2 Blood -NEG 2/2 TA - NEG 2/2 Urine - NEG 2/2 MRSA - NEG  Vinnie Level, PharmD., BCPS Clinical Pharmacist Pager [phone]

## 2015-10-17 NOTE — Progress Notes (Signed)
Cortrex team will be available to place temporary line at 0900 2/22.Peg tube feeding placed on hold 06:45 2/20.waiting input from trauma re plan for peg site.

## 2015-10-17 NOTE — Progress Notes (Signed)
STROKE TEAM PROGRESS NOTE   SUBJECTIVE (INTERVAL HISTORY)  Peg tube not working. Will ask trauma team to evaluate. May ned temporary panda tube for nutrition Palliative meeting scheduled for Wed when family is also coming to for [facility] MCD appt due to planned planned placement in vent SNF there.   OBJECTIVE Temp:  [98.9 F (37.2 C)-99.9 F (37.7 C)] 99.8 F (37.7 C) (02/21 0718) Pulse Rate:  [84-112] 92 (02/21 0718) Cardiac Rhythm:  [-] Sinus tachycardia (02/20 2000) Resp:  [20-37] 26 (02/21 0718) BP: (108-147)/(59-83) 108/62 mmHg (02/21 0718) SpO2:  [98 %-100 %] 100 % (02/21 0718) FiO2 (%):  [30 %] 30 % (02/21 0718) Weight:  [113 kg (249 lb 1.9 oz)] 113 kg (249 lb 1.9 oz) (02/21 0500)  CBC:   Recent Labs Lab 10/14/15 0314 10/15/15 0623  WBC 10.8* 12.2*  HGB 7.5* 8.2*  HCT 24.2* 25.4*  MCV 94.5 96.6  PLT 381 436*    Basic Metabolic Panel:   Recent Labs Lab 10/15/15 0343 10/16/15 0514  NA 140 141  K 5.1 4.3  CL 108 108  CO2 20* 23  GLUCOSE 165* 169*  BUN 50* 48*  CREATININE 1.23* 1.26*  CALCIUM 8.5* 8.5*    Lipid Panel:     Component Value Date/Time   CHOL 170 09/28/2015 0938   TRIG 245* 10/04/2015 0841   HDL 38* 09/28/2015 0938   CHOLHDL 4.5 09/28/2015 0938   VLDL UNABLE TO CALCULATE IF TRIGLYCERIDE OVER 400 mg/dL 19/14/7829 5621   LDLCALC UNABLE TO CALCULATE IF TRIGLYCERIDE OVER 400 mg/dL 30/86/5784 6962   XBMW4X:  Lab Results  Component Value Date   HGBA1C 6.9* 09/28/2015   Urine Drug Screen:     Component Value Date/Time   LABOPIA NONE DETECTED 09/28/2015 0226   LABBENZ NONE DETECTED 09/28/2015 0226   AMPHETMU NONE DETECTED 09/28/2015 0226   THCU NONE DETECTED 09/28/2015 0226   LABBARB NONE DETECTED 09/28/2015 0226      IMAGING  No results found.    Physical exam  Exam is stable today.   General - Well nourished, well developed, s/p trach and PEG.  Ophthalmologic - Fundi not visualized due to noncooperation.  Cardiovascular -  Regular rhythm, but tachycardia. No M/R/G  Neruo - Opens eyes to sternal rub,  Nonverbal, not following commands, grimaces to sternal run and pain, s/p trach and peg, on ventilation. Not following any commands. PERRL, left gaze preference, Positive corneal and gag. Right facial droop. tongue in middle inside mouth. Spontaneous movement LUE and LLE on pain stimulation. RUE and RLE withdraws less than left side to pain, with increased tone. DTR 1+, toes equiv.. Sensation, gait and coordination not tested.   ASSESSMENT/PLAN Linda [name] is a 60 y.o. female with history of left BG ICH in 2012, hypertension, cocaine abuse, renal failure and hepatitis C presenting with altered mental status, right side weakness and leftward gaze. Also concerning for possible seizure activity initial presentation. She did not receive IV t-PA due to hx ICH, CTA showed a L M2 occlusion, she was sent to IR where she received complete TICI3 revascularization of the L MCA with mechanical thrombectomy with trevo and IA Integrilin.  Stroke:  left MCA infarct with hemorrhagic transformation s/p revascularizationf of L M2, multifocal scattered infarcts involving bilateral posterior and anterior circulation, consistent with cardioembolic source although pt does have significant large vessel intracranial atherosclerosis as well as cocaine abuse.  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  CTA neck L thyroid nodule  2D Echo  EF 55-60%  LE venous doppler negative for DVT  LDL unable to calculate due to high TG   HgbA1c 6.9  UDS positive for cocaine  Heparin subq  for VTE prophylaxis Diet NPO time specified  No antithrombotic prior to admission, now on ASA 325mg .   Ongoing aggressive stroke risk factor management  Therapy recommendations:  SNF with vent support  Disposition: SNF with vent support  Palliative care consulted for goals of care -scheduled for Wednesday  Family applying for [facility] Wednesday, coming to [facility] for appointment with planned vent SNF placement in [place]   Bacteremia   Positive blood Cx 10/05/15 G+ in cluster with coagulase negative 2/2  Leukocytosis WBC 12.2 2/19  Sensitive to vanco  ID recs appreciated, recommend line holidays.   Central line changed IJ to PICC  Presumed endocarditis but hold off further work up due to limited functional improvement so far and likely poor prognosis  Anemia   Hb 8.3-6.8->8.3->8.8 -> 7.9 -> 7.5 -> 8.2  S/p blood transfusion  Likely due to PEG tube site bleeding  Trauma Team on board, appreciate their help  Stool guaiac  (negative 10/12/2015)  Hx of ICH  2012 left BG ICH; during hospital course:  BP high at 200s  Urine positive with cocaine   S/p trach and PEG at that time  Lost follow up since  Acute respiratory failure  Intubated in ED due to airway protection  S/p trach, not able to wean off ventilation  On Risperdal and clonidine patch for agitation, scheduled ativan tapered off  Sputum culture continue to show MRSA 10/06/15  Treated with vancomycin   CCM following  DM  A1C 6.9 on admission  Required high dose SSI  Increase lantus to 30 units bid  CBG monitoring Q6  Glucose much improved  ? Seizure  Questionable seizure presentation initially as reported in chart  EEG showed no seizure but under sedation  Hold off AEDs at this time  No seizure activity since admission  Hypernatremia, resolved  Na 152->151->149->148->143->143 -> 138 ->140->141 2/20  Hypertensive Emergency  BP as high as 202/124  Treated with  cardene drip, now off  BP Stable   On clonidine patch  Hyperlipidemia  Home meds:  No statin  LDL not able to calculate due to high TG , goal < 70  Total chol 170  on lipitor 10mg   Continue statin at discharge  Dysphagia   Hx Dysphagia secondary to ICH in 2012 with PEG  S/p PEG  On TF  Bowel regimen  Cocaine abuse  urine positive both in 2012 and this admission  Likely one of the causes of intracranial vascular stenosis  Other Stroke Risk Factors  Obesity, Body mass index is 39.01 kg/(m^2).   Other Active Problems  Hepatitis C  Renal Insuff.   BUN 50 / Cre 1.23 Sunday  Thyroid abnormality - f/u recommended.  Edematous   New decub ulcer on the buttocks, cannot determine stage    Hospital day # 8      Personally examined patient and images, and have participated in and made any corrections needed to history, physical, neuro exam,assessment and plan  as stated above.  I have personally obtained the history, evaluated lab date, reviewed imaging studies and agree with radiology interpretations. Plan on asking, team to evaluate PEG tube if nonfunctional may need temporary panda tube. Discussed with Child psychotherapist and nurse and Dr. Janee Morn from trauma team  This patient is critically ill due to large left MCA infarct with left M2 occlusion s/p intervention, hemorrhagic transformation, bacteremia, acute blood loss anemia, PEG tube site bleeding, acute resp failure s/p trach, hypervolemia, renal insufficiency,  new decubitus ulcers,  and at significant risk of neurological worsening, death form recurrent infarcts, increased ICH, brain herniation, cerebral edema, heart failure and sepsis. This patient's care requires constant monitoring of vital signs, hemodynamics, respiratory and cardiac monitoring, review of multiple databases, neurological assessment, discussion with family, other specialists and medical decision making of high complexity. I spent 30 minutes of  neurocritical care time in the care of this patient.   Delia Heady, MD Stroke Neurology [phone] [facility]           To contact Stroke Continuity provider, please refer to [email or web address]. After hours, contact General Neurology

## 2015-10-17 NOTE — Progress Notes (Signed)
Utilization Review Completed.  

## 2015-10-17 NOTE — Progress Notes (Signed)
Received patient with tube feeding on hold r/t mal functioning peg. Peg site presents with copious non odorous green secretions. Awaiting input from trauma , ,temporary panda tube on hold until trauma consult. Palliative/case management aware of peg tube situation which may pose delay in transfer of pt to [place].

## 2015-10-17 NOTE — Consult Note (Signed)
WOC wound follow up Wound type: Stage 2 Pressure Injury (MDRPI) medical device related pressure injury Measurement: two small areas both 0.5cm x 0.5cm x 0.1cm  Wound bed:100% pink, healing well Drainage (amount, consistency, odor) moderate, serosanguinous, no odor Periwound:intact, at trach site, some drainage noted from tracheostomy site, not from wounds Dressing procedure/placement/frequency: Continue silver hydrofiber and silicone foam  WOC team will follow along with you for weekly wound assessments.  Please notify me of any acute changes in the wounds or any new areas of concerns Armen Pickup RN,CWOCN [phone]

## 2015-10-17 NOTE — Progress Notes (Signed)
Nutrition Follow-up  DOCUMENTATION CODES:   Obesity unspecified  INTERVENTION:    As medically appropriate, resume Vital High Protein formula at 45 ml/hr with 30 ml Prostat BID  Above TF regimen provides 1280 kcals, 124 gm protein, 903 ml of water  NUTRITION DIAGNOSIS:   Inadequate oral intake related to inability to eat as evidenced by NPO status, ongoing   GOAL:   Provide needs based on ASPEN/SCCM guidelines, currently unmet  MONITOR:   Vent status, TF tolerance, Labs, Weight trends, Skin, I & O's  ASSESSMENT:   Pt with history of stroke (had trach/peg) admitted L MCA +cocaine s/p clot retrieval 2/2.   Patient s/p procedure 2/8: PERCUTANEOUS ENDOSCOPIC GASTROSTOMY (PEG) PLACEMENT PERCUTANEOUS TRACHEOSTOMY   Patient is currently intubated on ventilator support MV: 13.4 L/min Temp (24hrs), Avg:99.2 F (37.3 C), Min:97.6 F (36.4 C), Max:99.9 F (37.7 C)   TF currently on hold via PEG tube.  + infection to tube.  Awaiting Trauma Service to assess.  Previous TF regimen: Vital HP formula at 45 ml/hr with Prostat liquid protein 30 ml BID (1280 kcals, 124 gm protein, 903 ml of water).  CWOCN note 2/21 reviewed.  Pt with Stage II injury (MDRPI) - medical device related.  Diet Order:  Diet NPO time specified  Skin:  Wound (see comment) (MDRPI to left side of neck)  Last BM:  2/21  Height:   Ht Readings from Last 1 Encounters:  09/28/15  (1.702 m)    Weight:   Wt Readings from Last 1 Encounters:  10/17/15 249 lb 1.9 oz (113 kg)    Ideal Body Weight:  61.3 kg  BMI:  Body mass index is 39.01 kg/(m^2).  Estimated Nutritional Needs:   Kcal:  6578-4696   Protein:  >/= 122  Fluid:  >/=1.2 L  EDUCATION NEEDS:   No education needs identified at this time   Maureen Chatters, RD, LDN Pager #: [phone] After-Hours Pager #: [phone]

## 2015-10-18 ENCOUNTER — Inpatient Hospital Stay (HOSPITAL_COMMUNITY): Payer: Medicaid Other

## 2015-10-18 DIAGNOSIS — Z515 Encounter for palliative care: Secondary | ICD-10-CM

## 2015-10-18 LAB — GLUCOSE, CAPILLARY
GLUCOSE-CAPILLARY: 113 mg/dL — AB (ref 65–99)
GLUCOSE-CAPILLARY: 105 mg/dL — AB (ref 65–99)
GLUCOSE-CAPILLARY: 110 mg/dL — AB (ref 65–99)
GLUCOSE-CAPILLARY: 150 mg/dL — AB (ref 65–99)
Glucose-Capillary: 106 mg/dL — ABNORMAL HIGH (ref 65–99)
Glucose-Capillary: 99 mg/dL (ref 65–99)

## 2015-10-18 MED ORDER — IOHEXOL 300 MG/ML  SOLN
50.0000 mL | Freq: Once | INTRAMUSCULAR | Status: AC | PRN
  Administered 2015-10-18: 50 mL via ORAL

## 2015-10-18 NOTE — Progress Notes (Signed)
Patient ID: Linda [name], female   DOB: May 07, 1956, 60 y.o.   MRN: [id number]  Asked to reassess PEG site with new, green, malodorous discharge. According to RN and notes it doesn't appear that the PEG stopped working (i.e. Unable to infuse liquids through it), just that the PEG site started draining. The PEG appears to be located properly. There is no surrounding erythema to indicate a cellulitic process. There is a green mucoid discharge coming from the wound. I suspect the hematoma has become infected. Will obtain culture to guide abx coverage; she's already been started on vanc/Zosyn. Even though I think unnecessary I will obtain dye study through PEG to reassure primary team and nursing. If in place, ok to continue TF in face of site infection.    Freeman Caldron, PA-C Pager: [phone] General Trauma PA Pager: [phone]

## 2015-10-18 NOTE — Progress Notes (Signed)
Orthopedic Tech Progress Note Patient Details:  Linda [name] [birth year] [id number]  Ortho Devices Type of Ortho Device: Abdominal binder Ortho Device/Splint Interventions: Ordered, Application   Jennye Moccasin 10/18/2015, 9:40 PM

## 2015-10-18 NOTE — Progress Notes (Signed)
eLink Physician-Brief Progress Note Patient Name: Linda [name] DOB: 04-11-1956 MRN: [id number]   Date of Service  10/18/2015  HPI/Events of Note  Request for L wrist restraint.  eICU Interventions  Will order L wrist restraint.     Intervention Category Minor Interventions: Agitation / anxiety - evaluation and management  Lenell Antu 10/18/2015, 9:36 PM

## 2015-10-18 NOTE — Progress Notes (Signed)
Patient continues to pull off vent while in mittens. Dr. Arsenio Loader called and updated. New order for left soft wrist restraint obtained and applied. Sister Liborio Nixon called and updated as well. Will continuer to monitor.

## 2015-10-18 NOTE — Progress Notes (Signed)
Patient ID: Linda [name], female   DOB: Apr 15, 1956, 60 y.o.   MRN: [id number]  X-ray confirms PEG is in place and can be used for tube feeding. Continue abx until wound culture is back.    Freeman Caldron, PA-C Pager: [phone] General Trauma PA Pager: [phone]

## 2015-10-18 NOTE — Progress Notes (Signed)
STROKE TEAM PROGRESS NOTE   SUBJECTIVE (INTERVAL HISTORY)  Peg tube not working. Will ask trauma team to evaluate. May need temporary panda tube for nutrition Palliative meeting scheduled for Wed when family is also coming [facility] due to planned planned placement in vent SNF there. Hold long-acting insulin due to no nutrition currently, short-acting prn.    OBJECTIVE Temp:  [98.2 F (36.8 C)-99.6 F (37.6 C)] 98.6 F (37 C) (02/22 1151) Pulse Rate:  [74-100] 95 (02/22 1151) Cardiac Rhythm:  [-] Normal sinus rhythm (02/22 0900) Resp:  [20-29] 29 (02/22 1151) BP: (97-125)/(51-68) 122/62 mmHg (02/22 1151) SpO2:  [95 %-100 %] 98 % (02/22 1151) FiO2 (%):  [30 %] 30 % (02/22 1151) Weight:  [108.41 kg (239 lb)] 108.41 kg (239 lb) (02/22 0500)  CBC:   Recent Labs Lab 10/14/15 0314 10/15/15 0623  WBC 10.8* 12.2*  HGB 7.5* 8.2*  HCT 24.2* 25.4*  MCV 94.5 96.6  PLT 381 436*    Basic Metabolic Panel:   Recent Labs Lab 10/15/15 0343 10/16/15 0514  NA 140 141  K 5.1 4.3  CL 108 108  CO2 20* 23  GLUCOSE 165* 169*  BUN 50* 48*  CREATININE 1.23* 1.26*  CALCIUM 8.5* 8.5*    Lipid Panel:     Component Value Date/Time   CHOL 170 09/28/2015 0938   TRIG 245* 10/04/2015 0841   HDL 38* 09/28/2015 0938   CHOLHDL 4.5 09/28/2015 0938   VLDL UNABLE TO CALCULATE IF TRIGLYCERIDE OVER 400 mg/dL 25/42/7062 3762   LDLCALC UNABLE TO CALCULATE IF TRIGLYCERIDE OVER 400 mg/dL 83/15/1761 6073   XTGG2I:  Lab Results  Component Value Date   HGBA1C 6.9* 09/28/2015   Urine Drug Screen:     Component Value Date/Time   LABOPIA NONE DETECTED 09/28/2015 0226   LABBENZ NONE DETECTED 09/28/2015 0226   AMPHETMU NONE DETECTED 09/28/2015 0226   THCU NONE DETECTED 09/28/2015 0226   LABBARB NONE DETECTED 09/28/2015 0226      IMAGING  MRI of the brain without contrast 09/28/2015 Large acute hemorrhagic left hemispheric infarct involves the majority of the left middle cerebral  artery distribution. Hemorrhage is most notable involving the left posterior temporal -parietal aspect of this acute infarct. Local mass effect upon the left lateral ventricle. At most there is minimal bowing of the septum to the right.  Several small acute nonhemorrhagic right hemispheric infarcts involving right caudate head, superior aspect of the post limb right internal capsule, right frontal lobe, superior right lenticular nucleus and right occipital lobe.  Several small acute nonhemorrhagic cerebellar infarcts greater on the right.  Remote left lenticular nucleus infarct or hematoma with blood-stained cleft now noted.  Moderate to marked chronic small vessel disease changes.  Atrophy.    Physical exam  General - Well nourished, well developed, s/p trach and PEG.  Ophthalmologic - Fundi not visualized due to noncooperation.  Cardiovascular - Regular rhythm, but tachycardia. No M/R/G  Neruo - More alert today, eyes open,  Nonverbal, not following commands, grimaces to sternal run and pain, s/p trach and peg, on ventilation. Not following any commands. PERRL, left gaze preference, Positive corneal and gag. Right facial droop. tongue in middle inside mouth. Brisk movement LUE and LLE on pain stimulation. RUE and RLE withdraws less than left side to pain, with increased tone. DTR 1+, toes equiv.. Sensation, gait and coordination not tested.   ASSESSMENT/PLAN Ms. EXIE CHRISMER is a 60 y.o. female with history of left BG  ICH in 2012, hypertension, cocaine abuse, renal failure and hepatitis C presenting with altered mental status, right side weakness and leftward gaze. Also concerning for possible seizure activity initial presentation. She did not receive IV t-PA due to hx ICH, CTA showed a L M2 occlusion, she was sent to IR where she received complete TICI3 revascularization of the L MCA with mechanical thrombectomy with trevo and IA Integrilin.  Stroke:  left MCA  infarct with hemorrhagic transformation s/p revascularizationf of L M2, multifocal scattered infarcts involving bilateral posterior and anterior circulation, consistent with cardioembolic source although pt does have significant large vessel intracranial atherosclerosis as well as cocaine abuse.  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  CTA neck L thyroid nodule  2D Echo  EF 55-60%  LE venous doppler negative for DVT  LDL unable to calculate due to high TG   HgbA1c 6.9  UDS positive for cocaine  Heparin subq for VTE prophylaxis Diet NPO time specified  No antithrombotic prior to admission, now on ASA 325mg .   Ongoing aggressive stroke risk factor management  Therapy recommendations:  SNF with vent support  Disposition: SNF with vent support  Palliative care consulted for goals of care -scheduled for Wednesday  Family applying for [facility] Wednesday, coming to [facility] for appointment with planned vent SNF placement in [place]   Bacteremia   Positive blood Cx 10/05/15 G+ in cluster with coagulase negative 2/2  Leukocytosis WBC 12.2 2/19  Sensitive to vanco  ID recs appreciated, recommend line holidays.   Central line changed IJ to PICC  Presumed endocarditis but hold off further work up due to limited functional improvement so far and likely poor prognosis  Anemia   Hb 8.3-6.8->8.3->8.8 -> 7.9 -> 7.5 -> 8.2  S/p blood transfusion  Likely due to PEG tube site bleeding  Trauma Team on board, appreciate their help  Stool guaiac  (negative 10/12/2015)  Hx of ICH  2012 left BG ICH; during  hospital course:  BP high at 200s  Urine positive with cocaine   S/p trach and PEG at that time  Lost follow up since  Acute respiratory failure  Intubated in ED due to airway protection  S/p trach, not able to wean off ventilation  On Risperdal and clonidine patch for agitation, scheduled ativan tapered off  Sputum culture continue to show MRSA 10/06/15  Treated with vancomycin   CCM following  DM  A1C 6.9 on admission  Required high dose SSI  Increase lantus to 30 units bid  CBG monitoring Q6  Glucose much improved  ? Seizure  Questionable seizure presentation initially as reported in chart  EEG showed no seizure but under sedation  Hold off AEDs at this time  No seizure activity since admission  Hypernatremia, resolved  Na 152->151->149->148->143->143 -> 138 ->140->141 2/20  Hypertensive Emergency  BP as high as 202/124  Treated with cardene drip, now off  BP Stable   On clonidine patch  Hyperlipidemia  Home meds:  No statin  LDL not able to calculate due to high TG , goal < 70  Total chol 170  on lipitor 10mg   Continue statin at discharge  Dysphagia  Hx Dysphagia secondary to ICH in 2012 with PEG  S/p PEG  On TF  Bowel regimen  Cocaine abuse  urine positive both in 2012 and this admission  Likely one of the causes of intracranial vascular stenosis  Other Stroke Risk Factors  Obesity, Body mass index is 37.42 kg/(m^2).   Other Active Problems  Hepatitis C  Renal Insuff.   BUN 50 / Cre 1.23 Sunday  Thyroid abnormality - f/u recommended.  Edematous   New decub ulcer on the buttocks, cannot determine stage    Hospital day # 20      Personally examined patient and images, and have participated in and made any corrections needed to history, physical, neuro exam,assessment and plan as stated above.  I have personally obtained the history, evaluated lab data, reviewed imaging studies and agree with radiology  interpretations. Team to evaluate PEG tube if nonfunctional may need temporary panda tube. Discussed with Child psychotherapist and nurse and Dr. Janee Morn from trauma team  This patient is critically ill due to large left MCA infarct with left M2 occlusion s/p intervention, hemorrhagic transformation, bacteremia, acute blood loss anemia, PEG tube site bleeding, acute resp failure s/p trach, hypervolemia, renal insufficiency,  new decubitus ulcers,  and at significant risk of neurological worsening, death form recurrent infarcts, increased ICH, brain herniation, cerebral edema, heart failure and sepsis. This patient's care requires constant monitoring of vital signs, hemodynamics, respiratory and cardiac monitoring, review of multiple databases, neurological assessment, discussion with family, other specialists and medical decision making of high complexity. I spent 30 minutes of neurocritical care time in the care of this patient.   Naomie Dean, MD Stroke Neurology [phone] [facility]             To contact Stroke Continuity provider, please refer to [email or web address]. After hours, contact General Neurology

## 2015-10-18 NOTE — Progress Notes (Signed)
Daily Progress Note   Patient Name: Linda [name]       Date: 10/18/2015 DOB: 03/02/1956  Age: 60 y.o. MRN#: [id number] Attending Physician: Garvin Fila, MD Primary Care Physician: [place] Date: 09/28/2015  Reason for Consultation/Follow-up: Establishing goals of care  Subjective: I met today with Ms. Harvery's sister and niece, Thayer Headings and Madlyn Frankel. They both explain that they hear what the doctors are telling them but they have been told in the past (2012) that she would never be more than a vegetative state but recovered to walk and even live independently. They are not ready to "give up on her." They do seem to understand their choices - Thayer Headings talks of her other sister who they had to make the choice to convert to to comfort care when she past so she says that she completely understands the decision. We discussed the importance of keeping in touch with staff at the facility so they are aware of her progress good or bad so they can make good decisions for her. We discussed that family should reconvene in 1 month to see if she is progressing and if no change or decline should consider care focused more on comfort. They seem to agree on this approach. We also discussed if she were to worsen to the point of cardiac arrest what we should do and they confirm that they do not believe that we should perform CPR/shock but wish to continue all other measures/interventions with hopes of improvement. They do not believe this will happen and firmly believe that she will improve as before. We explained that we cannot medically improve her condition and this would truly take a miracle. Also gave them Hard Choices booklet.   Length of Stay: 20 days  Current Medications: Scheduled  Meds:  .  stroke: mapping our early stages of recovery book   Does not apply Once  . antiseptic oral rinse  7 mL Mouth Rinse 10 times per day  . aspirin  325 mg Per Tube Daily  . atorvastatin  10 mg Oral q1800  . chlorhexidine gluconate  15 mL Mouth Rinse BID  . cloNIDine  0.1 mg Transdermal Weekly  . docusate  50 mg Oral BID  . feeding supplement (PRO-STAT SUGAR FREE 64)  30 mL Per Tube BID  .  free water  300 mL Per Tube Q4H  . heparin subcutaneous  5,000 Units Subcutaneous 3 times per day  . insulin aspart  0-20 Units Subcutaneous 6 times per day  . insulin glargine  30 Units Subcutaneous BID  . multivitamin with minerals  1 tablet Per Tube Daily  . pantoprazole sodium  40 mg Per Tube QHS  . piperacillin-tazobactam (ZOSYN)  IV  3.375 g Intravenous 3 times per day  . risperiDONE  2 mg Oral Q12H  . sodium chloride flush  10-40 mL Intracatheter Q12H  . vancomycin  500 mg Intravenous Q12H    Continuous Infusions: . sodium chloride 75 mL/hr (10/17/15 1200)  . feeding supplement (VITAL HIGH PROTEIN) 1,000 mL (10/18/15 1209)    PRN Meds: acetaminophen (TYLENOL) oral liquid 160 mg/5 mL, bisacodyl, fentaNYL (SUBLIMAZE) injection, hydrALAZINE, LORazepam, ondansetron (ZOFRAN) IV, sodium chloride flush  Physical Exam: Physical Exam  Constitutional: She appears well-developed and well-nourished. She appears lethargic. She is intubated.  HENT:  Head: Normocephalic.  Cardiovascular: Normal rate.   Pulmonary/Chest: Effort normal. No accessory muscle usage. She is intubated. No respiratory distress. She has decreased breath sounds.  Abdominal: Soft. Normal appearance.  Neurological: She appears lethargic.  Did not open eyes or follow commands                Vital Signs: BP 114/67 mmHg  Pulse 90  Temp(Src) 98.6 F (37 C) (Oral)  Resp 19  Ht 5' 7"  (1.702 m)  Wt 108.41 kg (239 lb)  BMI 37.42 kg/m2  SpO2 98% SpO2: SpO2: 98 % O2 Device: O2 Device: Ventilator O2 Flow Rate:     Intake/output summary:  Intake/Output Summary (Last 24 hours) at 10/18/15 1653 Last data filed at 10/18/15 1600  Gross per 24 hour  Intake   2685 ml  Output   1150 ml  Net   1535 ml   LBM: Last BM Date: 10/17/15 Baseline Weight: Weight: 105.1 kg (231 lb 11.3 oz) Most recent weight: Weight: 108.41 kg (239 lb)       Palliative Assessment/Data: Flowsheet Rows        Most Recent Value   Intake Tab    Referral Department  Neurology   Unit at Time of Referral  Intermediate Care Unit   Palliative Care Primary Diagnosis  Neurology   Date Notified  10/13/15   Palliative Care Type  New Palliative care   Reason for referral  Clarify Goals of Care   Date of Admission  09/28/15   Date first seen by Palliative Care  10/16/15   # of days Palliative referral response time  3 Day(s)   # of days IP prior to Palliative referral  15   Clinical Assessment    Palliative Performance Scale Score  10%   Psychosocial & Spiritual Assessment    Palliative Care Outcomes    Patient/Family meeting held?  No      Additional Data Reviewed: CBC    Component Value Date/Time   WBC 12.2* 10/15/2015 0623   WBC 6.2 02/28/2014 0640   RBC 2.63* 10/15/2015 0623   RBC 3.68* 02/28/2014 0640   HGB 8.2* 10/15/2015 0623   HGB 11.7* 02/28/2014 0640   HCT 25.4* 10/15/2015 0623   HCT 33.7* 02/28/2014 0640   PLT 436* 10/15/2015 0623   PLT 235 02/28/2014 0640   MCV 96.6 10/15/2015 0623   MCV 91 02/28/2014 0640   MCH 31.2 10/15/2015 0623   MCH 31.8 02/28/2014 0640   MCHC 32.3 10/15/2015 5400  MCHC 34.7 02/28/2014 0640   RDW 14.9 10/15/2015 0623   RDW 13.3 02/28/2014 0640   LYMPHSABS 4.3* 09/29/2015 0420   MONOABS 0.9 09/29/2015 0420   EOSABS 0.0 09/29/2015 0420   BASOSABS 0.0 09/29/2015 0420    CMP     Component Value Date/Time   NA 141 10/16/2015 0514   NA 139 02/28/2014 0640   K 4.3 10/16/2015 0514   K 3.9 02/28/2014 0640   CL 108 10/16/2015 0514   CL 105 02/28/2014 0640   CO2 23 10/16/2015  0514   CO2 29 02/28/2014 0640   GLUCOSE 169* 10/16/2015 0514   GLUCOSE 206* 02/28/2014 0640   BUN 48* 10/16/2015 0514   BUN 14 02/28/2014 0640   CREATININE 1.26* 10/16/2015 0514   CREATININE 1.27 02/28/2014 0640   CALCIUM 8.5* 10/16/2015 0514   CALCIUM 8.5 02/28/2014 0640   PROT 5.6* 10/12/2015 0240   PROT 9.0* 04/10/2012 1714   ALBUMIN 1.9* 10/12/2015 0240   ALBUMIN 3.7 04/10/2012 1714   AST 31 10/12/2015 0240   AST 78* 04/10/2012 1714   ALT 17 10/12/2015 0240   ALT 59 04/10/2012 1714   ALKPHOS 45 10/12/2015 0240   ALKPHOS 141* 04/10/2012 1714   BILITOT 0.8 10/12/2015 0240   BILITOT 0.4 04/10/2012 1714   GFRNONAA 45* 10/16/2015 0514   GFRNONAA 46* 02/28/2014 0640   GFRNONAA 44* 11/13/2011 1515   GFRAA 53* 10/16/2015 0514   GFRAA 53* 02/28/2014 0640   GFRAA 53* 11/13/2011 1515       Problem List:  Patient Active Problem List   Diagnosis Date Noted  . Chronic respiratory failure ([place]) 10/16/2015  . Palliative care encounter   . ACP (advance care planning)   . Acute blood loss anemia   . Pressure ulcer 10/10/2015  . Tracheostomy care ([place])   . Ventilator dependence ([place])   . Bacteremia   . Cerebrovascular accident (CVA) due to occlusion of cerebral artery ([place])   . Coarctation of aorta, recurrent, post-intervention   . Cerebrovascular accident (CVA) due to thrombosis of cerebral artery ([place])   . Cytotoxic brain edema ([place]) 10/05/2015  . Ischemic stroke ([place])   . Respiratory failure ([place])   . Leukocytosis   . Fever, unspecified   . CVA (cerebral infarction) 09/28/2015  . HTN (hypertension) 09/28/2015  . Cocaine abuse 09/28/2015  . Acute respiratory failure with hypoxia ([place]) 09/28/2015  . Stroke (cerebrum) [facility])      Palliative Care Assessment & Plan    1.Code Status:  Limited code    Code Status Orders        Start     Ordered   10/18/15 1647  Limited resuscitation (code)   Continuous    Question Answer Comment  In the event of cardiac or  respiratory ARREST: Initiate Code Blue, Call Rapid Response Yes   In the event of cardiac or respiratory ARREST: Perform CPR No   In the event of cardiac or respiratory ARREST: Perform Intubation/Mechanical Ventilation Yes   In the event of cardiac or respiratory ARREST: Use NIPPV/BiPAp only if indicated Yes   In the event of cardiac or respiratory ARREST: Administer ACLS medications if indicated Yes   In the event of cardiac or respiratory ARREST: Perform Defibrillation or Cardioversion if indicated No      10/18/15 1646    Code Status History    Date Active Date Inactive Code Status Order ID Comments User Context   09/28/2015  7:43 AM 10/18/2015  4:46 PM Full Code  [id number]  Wallie Char Inpatient       2. Goals of Care/Additional Recommendations:  Limited code but otherwise full aggressive care desired.   Limitations on Scope of Treatment: Full Scope Treatment  Desire for further Chaplaincy support:no  Psycho-social Needs: Caregiving  Support/Resources  3. Symptom Management:      1. Continue current prn medications for pain and anxiety.   4. Palliative Prophylaxis:   Aspiration, Bowel Regimen, Frequent Pain Assessment, Oral Care and Turn Reposition  5. Prognosis: Extremely poor prognosis with high risk of infection and acute decompensation.   6. Discharge Planning:  CSW arranging placement in [place] SNF.    Thank you for allowing the Palliative Medicine Team to assist in the care of this patient.   Time In: 1630 Time Out: 1710 Total Time 23mn Prolonged Time Billed  no         APershing Proud NP  23/74/8270 4:53 PM  Please contact Palliative Medicine Team phone at [phone] questions and concerns.

## 2015-10-19 DIAGNOSIS — D62 Acute posthemorrhagic anemia: Secondary | ICD-10-CM

## 2015-10-19 DIAGNOSIS — Z7189 Other specified counseling: Secondary | ICD-10-CM

## 2015-10-19 LAB — GLUCOSE, CAPILLARY
GLUCOSE-CAPILLARY: 116 mg/dL — AB (ref 65–99)
GLUCOSE-CAPILLARY: 120 mg/dL — AB (ref 65–99)
GLUCOSE-CAPILLARY: 137 mg/dL — AB (ref 65–99)
Glucose-Capillary: 114 mg/dL — ABNORMAL HIGH (ref 65–99)
Glucose-Capillary: 126 mg/dL — ABNORMAL HIGH (ref 65–99)
Glucose-Capillary: 137 mg/dL — ABNORMAL HIGH (ref 65–99)

## 2015-10-19 LAB — BASIC METABOLIC PANEL
Anion gap: 12 (ref 5–15)
BUN: 36 mg/dL — AB (ref 6–20)
CALCIUM: 8.4 mg/dL — AB (ref 8.9–10.3)
CHLORIDE: 109 mmol/L (ref 101–111)
CO2: 24 mmol/L (ref 22–32)
CREATININE: 1.22 mg/dL — AB (ref 0.44–1.00)
GFR calc Af Amer: 55 mL/min — ABNORMAL LOW (ref >60)
GFR calc non Af Amer: 47 mL/min — ABNORMAL LOW (ref >60)
GLUCOSE: 142 mg/dL — AB (ref 65–99)
Potassium: 4.5 mmol/L (ref 3.5–5.1)
Sodium: 145 mmol/L (ref 135–145)

## 2015-10-19 MED ORDER — CHLORHEXIDINE GLUCONATE 0.12% ORAL RINSE (MEDLINE KIT)
15.0000 mL | Freq: Two times a day (BID) | OROMUCOSAL | Status: DC
  Administered 2015-10-20 – 2015-10-25 (×9): 15 mL via OROMUCOSAL

## 2015-10-19 MED ORDER — CHLORHEXIDINE GLUCONATE 0.12% ORAL RINSE (MEDLINE KIT): 15.0000 mL | Freq: Two times a day (BID) | OROMUCOSAL | Status: DC

## 2015-10-19 MED ORDER — ANTISEPTIC ORAL RINSE SOLUTION (CORINZ): 7.0000 mL | Freq: Four times a day (QID) | OROMUCOSAL | Status: DC

## 2015-10-19 MED ORDER — FUROSEMIDE 10 MG/ML IJ SOLN
40.0000 mg | Freq: Two times a day (BID) | INTRAMUSCULAR | Status: AC
  Administered 2015-10-19 – 2015-10-21 (×4): 40 mg via INTRAVENOUS
  Filled 2015-10-19 (×4): qty 4

## 2015-10-19 MED ORDER — ANTISEPTIC ORAL RINSE SOLUTION (CORINZ)
7.0000 mL | Freq: Four times a day (QID) | OROMUCOSAL | Status: DC
  Administered 2015-10-20 – 2015-10-25 (×15): 7 mL via OROMUCOSAL

## 2015-10-19 NOTE — Progress Notes (Signed)
STROKE TEAM PROGRESS NOTE   SUBJECTIVE (INTERVAL HISTORY)  Ok to use PEG per trauma team. Palliative meeting yesterday with  [facility] who [facility] planned [facility] in [facility] SNF there. Code changed to limited. Patient was pulling on vent, soft restraint placed left arm.    OBJECTIVE Temp:  [97.8 F (36.6 C)-98.7 F (37.1 C)] 97.9 F (36.6 C) (02/23 0300) Pulse Rate:  [75-95] 93 (02/23 0300) Cardiac Rhythm:  [-] Normal sinus rhythm (02/23 0300) Resp:  [18-29] 24 (02/23 0300) BP: (91-122)/(50-91) 107/67 mmHg (02/23 0300) SpO2:  [98 %-100 %] 100 % (02/23 0300) FiO2 (%):  [30 %] 30 % (02/23 0300) Weight:  [102.059 kg (225 lb)] 102.059 kg (225 lb) (02/23 0417)  CBC:   Recent Labs Lab 10/14/15 0314 10/15/15 0623  WBC 10.8* 12.2*  HGB 7.5* 8.2*  HCT 24.2* 25.4*  MCV 94.5 96.6  PLT 381 436*    Basic Metabolic Panel:   Recent Labs Lab 10/16/15 0514 10/19/15 0315  NA 141 145  K 4.3 4.5  CL 108 109  CO2 23 24  GLUCOSE 169* 142*  BUN 48* 36*  CREATININE 1.26* 1.22*  CALCIUM 8.5* 8.4*    Lipid Panel:     Component Value Date/Time   CHOL 170 09/28/2015 0938   TRIG 245* 10/04/2015 0841   HDL 38* 09/28/2015 0938   CHOLHDL 4.5 09/28/2015 0938   VLDL UNABLE TO CALCULATE IF TRIGLYCERIDE OVER 400 mg/dL 16/05/9603 5409   LDLCALC UNABLE TO CALCULATE IF TRIGLYCERIDE OVER 400 mg/dL 81/19/1478 2956   OZHY8M:  Lab Results  Component Value Date   HGBA1C 6.9* 09/28/2015   Urine Drug Screen:     Component Value Date/Time   LABOPIA NONE DETECTED 09/28/2015 0226   LABBENZ NONE DETECTED 09/28/2015 0226   AMPHETMU NONE DETECTED 09/28/2015 0226   THCU NONE DETECTED 09/28/2015 0226   LABBARB NONE DETECTED 09/28/2015 0226      IMAGING  MRI of the brain without contrast 09/28/2015 Large acute hemorrhagic left hemispheric infarct involves the majority of the left middle cerebral artery distribution. Hemorrhage is most notable involving  the left posterior temporal -parietal aspect of this acute infarct. Local mass effect upon the left lateral ventricle. At most there is minimal bowing of the septum to the right.  Several small acute nonhemorrhagic right hemispheric infarcts involving right caudate head, superior aspect of the post limb right internal capsule, right frontal lobe, superior right lenticular nucleus and right occipital lobe.  Several small acute nonhemorrhagic cerebellar infarcts greater on the right.  Remote left lenticular nucleus infarct or hematoma with blood-stained cleft now noted.  Moderate to marked chronic small vessel disease changes.  Atrophy.    Physical exam  General - Well nourished, well developed, s/p trach and PEG.  Ophthalmologic - Fundi not visualized due to noncooperation.  Cardiovascular - Regular rhythm, but tachycardia. No M/R/G  Neruo - Opens eyes to sternal rub and loud voice,  Nonverbal, not following commands, grimaces to sternal run and pain, s/p trach and peg, on ventilation. Not following any commands. PERRL, left gaze preference, Positive corneal and gag. Right facial droop. tongue in middle inside mouth. Spontaneous movements left arm and leg. Localizes left arm.  Brisk w/d LUE and LLE on pain stimulation. RUE and RLE withdraws less than left side to pain, with increased tone. DTR 1+, toes equiv.   ASSESSMENT/PLAN [name] is a 60 y.o. female with history of left  BG ICH in 2012, hypertension, cocaine abuse, renal failure and hepatitis C presenting with altered mental status, right side weakness and leftward gaze. Also concerning for possible seizure activity initial presentation. She did not receive IV t-PA due to hx ICH, CTA showed a L M2 occlusion, she was sent to IR where she received complete TICI3 revascularization of the L MCA with mechanical thrombectomy with trevo and IA Integrilin.  Stroke:  left MCA infarct with hemorrhagic transformation  s/p revascularizationf of L M2, multifocal scattered infarcts involving bilateral posterior and anterior circulation, consistent with cardioembolic source although pt does have significant large vessel intracranial atherosclerosis as well as cocaine abuse.  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  CTA neck L thyroid nodule  2D Echo  EF 55-60%  LE venous doppler negative for DVT  LDL unable to calculate due to high TG   HgbA1c 6.9  UDS positive for cocaine  Heparin subq for VTE prophylaxis Diet NPO time specified  No antithrombotic prior to admission, now on ASA .   Ongoing aggressive stroke risk factor management  Therapy recommendations:  SNF with vent support  Disposition: SNF with vent support  Palliative care consulted for goals of care -scheduled for Wednesday  Family applying for [facility] Wednesday, coming to [facility] for appointment with planned vent SNF placement in [place]   Bacteremia   Positive blood Cx 10/05/15 G+ in cluster with coagulase negative 2/2  Leukocytosis WBC 12.2 2/19  Sensitive to vanco  ID recs appreciated, recommend line holidays.   Central line changed IJ to PICC  Presumed endocarditis but hold off further work up due to limited functional improvement so far and likely poor prognosis  Anemia   Hb 8.3-6.8->8.3->8.8 -> 7.9 -> 7.5 -> 8.2  S/p blood transfusion  Likely due to PEG tube site bleeding  Trauma Team on board, appreciate their help  Stool guaiac  (negative 10/12/2015)  Hx of ICH  2012 left BG ICH; during hospital course:  BP high at 200s  Urine  positive with cocaine   S/p trach and PEG at that time  Lost follow up since  Acute respiratory failure  Intubated in ED due to airway protection  S/p trach, not able to wean off ventilation  On Risperdal and clonidine patch for agitation, scheduled ativan tapered off  Sputum culture continue to show MRSA 10/06/15  Treated with vancomycin   CCM following  DM  A1C 6.9 on admission  Required high dose SSI  Increase lantus to 30 units bid  CBG monitoring Q6  Glucose much improved  ? Seizure  Questionable seizure presentation initially as reported in chart  EEG showed no seizure but under sedation  Hold off AEDs at this time  No seizure activity since admission  Hypernatremia, resolved  Na 152->151->149->148->143->143 -> 138 ->140->141 2/20  Hypertensive Emergency  BP as high as 202/124  Treated with cardene drip, now off  BP Stable   On clonidine patch  Hyperlipidemia  Home meds:  No statin  LDL not able to calculate due to high TG , goal < 70  Total chol 170  on lipitor   Continue statin at discharge  Dysphagia  Hx Dysphagia secondary to ICH in 2012 with PEG  S/p PEG  On TF  Bowel regimen  Cocaine abuse  urine positive both in 2012 and this admission  Likely one of the causes of intracranial vascular stenosis  Other Stroke Risk Factors  Obesity, Body mass index is 35.23 kg/(m^2).   Other Active Problems  Hepatitis C  Renal Insuff.   BUN 50 / Cre 1.23 Sunday  Thyroid abnormality - f/u recommended.  Edematous   New decub ulcer on the buttocks, cannot determine stage    Hospital day # 21      Personally examined patient and images, and have participated in and made any corrections needed to history, physical, neuro exam,assessment and plan as stated above.  I have personally obtained the history, evaluated lab data, reviewed imaging studies and agree with radiology interpretations. Team to evaluate PEG tube if  nonfunctional may need temporary panda tube. Discussed with Child psychotherapist and nurse and Dr. Janee Morn from trauma team  This patient is critically ill due to large left MCA infarct with left M2 occlusion s/p intervention, hemorrhagic transformation, bacteremia, acute blood loss anemia, PEG tube site bleeding, acute resp failure s/p trach, hypervolemia, renal insufficiency,  new decubitus ulcers,  and at significant risk of neurological worsening, death form recurrent infarcts, increased ICH, brain herniation, cerebral edema, heart failure and sepsis. This patient's care requires constant monitoring of vital signs, hemodynamics, respiratory and cardiac monitoring, review of multiple databases, neurological assessment, discussion with family, other specialists and medical decision making of high complexity. I spent 30 minutes of neurocritical care time in the care of this patient.   Linda Dean, MD Stroke Neurology [phone] [facility]             To contact Stroke Continuity provider, please refer to [email or web address]. After hours, contact General Neurology

## 2015-10-19 NOTE — Plan of Care (Signed)
Problem: Education: Goal: Knowledge of patient specific risk factors addressed and post discharge goals established will improve Outcome: Not Progressing Variance: Physical/mental limitations

## 2015-10-19 NOTE — Progress Notes (Signed)
Name: Linda [name] MRN: [id number] DOB: 1955/09/18    ADMISSION DATE:  09/28/2015 CONSULTATION DATE: 09/28/15  REFERRING MD :  Neuro  CHIEF COMPLAINT:  Obtunded   CULTURES: 2/2 Blood >> negative 2/2 Urine >> negative 2/2 Sputum >> oral flora 2/4 Sputum >> MRSA 2/4 Blood >> Coag neg Staph 2/9 Blood >>GPC>> 2/10 Sputum >>MRSA 2/12 repeat sputum ulture MRSA 2/22 wound culture>>  STUDIES:  EEG 2/02 >> consistent with a sleep/sedation EEG with a possible suggestion of a left central cerebral dysfunction Echo 2/03 >> EF 55 to 60%, grade 1 diastolic dysfx Doppler legs b/l 2/04 >> no DVT   Subjective :  10/16/15:Patient does not follow any command, Is on  trached and is onVent support. No significant changes overnight.  Vital signs  Temp:  [98.5 F (36.9 C)-101.5 F (38.6 C)] 98.5 F (36.9 C) (02/20 0816) Pulse Rate:  [84-115] 101 (02/20 0816) Resp:  [7-37] 29 (02/20 0816) BP: (103-135)/(54-76) 127/59 mmHg (02/20 0816) SpO2:  [98 %-100 %] 100 % (02/20 0816) FiO2 (%):  [30 %] 30 % (02/20 0816) Weight:  [114.6 kg (252 lb 10.4 oz)] 114.6 kg (252 lb 10.4 oz) (02/20 0418)   PHYSICAL EXAMINATION: General:   On ventilator,,does not follow any commands, grimaces with pain Neuro:  Opens eyes, Right side flaccid HEENT: Clean trach site Cardiovascular: S1,S2 heart sound. RRR, No MRG noted  Lungs: Scattered rhonchi, no accessory muscle use Abdomen: obese, round. Bowel sounds present Musculoskeletal:  Rt side flaccid Skin:  No rash, ecchymosis noted   Recent Labs Lab 10/15/15 0343 10/16/15 0514 10/19/15 0315  NA 140 141 145  K 5.1 4.3 4.5  CL 108 108 109  CO2 20* 23 24  BUN 50* 48* 36*  CREATININE 1.23* 1.26* 1.22*  GLUCOSE 165* 169* 142*    Recent Labs Lab 10/13/15 1040 10/14/15 0314 10/15/15 0623  HGB 7.9* 7.5* 8.2*  HCT 24.4* 24.2* 25.4*  WBC 15.0* 10.8* 12.2*  PLT 381 381 436*   Dg Abd Portable 1v  10/18/2015  CLINICAL DATA:  Assess PEG position EXAM:  PORTABLE ABDOMEN - 1 VIEW COMPARISON:  None. FINDINGS: There is normal small bowel gas pattern. Contrast material noted within stomach. There is a percutaneous gastrostomy tube with tip in proximal 1/3 of the stomach. IMPRESSION: Contrast material within stomach without contrast extravasation. Percutaneous gastrostomy tube within proximal stomach. Electronically Signed   By: Natasha Mead M.D.   On: 10/18/2015 10:32    ASSESSMENT / PLAN:  Acute encephalopathy 2nd to Lt MCA CVA and cocaine. Continue ASA Continue Lorazepam for Agitation. Continue Resperidal  Continue Hydralazine and clonidine as needed for BP goal <160   Compromised airway in setting of CVA. Failure to wean from vent s/p tracheostomy 2/07. Pressure support wean as able.  Needs vent SNF.    Leukocytosis    Continue toTrend WBC    Continue wound care    Continue van/zosyn  CKD Stage-2,     Trend BMP, CBC    Start Diurese when tolerated    Monitor renal function        Bincy Varughese,AG-ACNP Pulmonary & Critical Care Pulmonary and Critical Care Medicine [facility] Pager: [phone] 10/19/2015, 2:11 PM   STAFF NOTE: I, Rory Percy, MD FACP have personally reviewed patient's available data, including medical history, events of note, physical examination and test results as part of my evaluation. I have discussed with resident/NP and other care providers such as pharmacist, RN and RRT. In addition,  I personally evaluated patient and elicited key findings WJ:XBJY response, does not follow commands, no recent pcxr, weaning now on PS 10 required, just making it with TV and rr response, consider full comfort care, she is grossly overloaded during stay, would push lasix to neg balance and if not progressed with PS weaning further repeat pcxr, she again was pos 1.5 liters, dc free water as na wnl, dc fluidS, ADD LASIX AND BMET IN AM , doubt we can get ps down further  Mcarthur Rossetti. Tyson Alias, MD, FACP Pgr:  [phone] [facility] 10/19/2015 4:19 PM

## 2015-10-19 NOTE — Trach Care Team (Signed)
Trach Care Progression Note   Patient Details Name: Linda [name] MRN: [id number] DOB: [birth year] Today's Date: 10/19/2015   Tracheostomy Assessment    Tracheostomy [place]iley 6 mm Cuffed (Active)  Status Secured 10/19/2015  1:20 PM  Site Assessment Clean;Dry 10/19/2015 11:54 AM  Site Care Cleansed;Dressing applied;Dried 10/19/2015  3:33 AM  Inner Cannula Care Changed/new 10/19/2015  3:33 AM  Ties Assessment Clean;Dry 10/19/2015  1:20 PM  Cuff pressure (cm) 26 cm 10/18/2015  3:31 PM  Emergency Equipment at bedside Yes 10/19/2015  1:20 PM     Care Needs     Respiratory Therapy O2 Device: Ventilator FiO2 (%): 30 % SpO2: 98 %    Speech Language Pathology  SLP chart review complete: Patient not ready for SLP services SLP barriers to progression:  (obtunded, not following commands)   Physical Therapy      Occupational Therapy      Nutritional Patient's Current Diet: Tube feeding Tube Feeding: Vital AF 1.2 Cal Tube Feeding Frequency: Continuous Tube Feeding Strength: Full strength    Case Management/Social Work Level of patient care prior to hospitalization: Home-Self care Living status: Alone Insurance payer: Medicaid    Provider Trach Care Team/Provider                          Recommendations Trach Care Team Members Present-  Lorinda Creed and Graciella Belton, RD   Continues on vent full support. None at present - Will continue to follow.          Tora Prunty, Silva Bandy 10/19/2015, 3:18 PM Scribe for Team

## 2015-10-20 LAB — BASIC METABOLIC PANEL
Anion gap: 10 (ref 5–15)
BUN: 34 mg/dL — ABNORMAL HIGH (ref 6–20)
CHLORIDE: 110 mmol/L (ref 101–111)
CO2: 23 mmol/L (ref 22–32)
CREATININE: 1.12 mg/dL — AB (ref 0.44–1.00)
Calcium: 8.4 mg/dL — ABNORMAL LOW (ref 8.9–10.3)
GFR calc non Af Amer: 52 mL/min — ABNORMAL LOW (ref >60)
Glucose, Bld: 115 mg/dL — ABNORMAL HIGH (ref 65–99)
POTASSIUM: 4.1 mmol/L (ref 3.5–5.1)
SODIUM: 143 mmol/L (ref 135–145)

## 2015-10-20 LAB — GLUCOSE, CAPILLARY
GLUCOSE-CAPILLARY: 120 mg/dL — AB (ref 65–99)
GLUCOSE-CAPILLARY: 141 mg/dL — AB (ref 65–99)
GLUCOSE-CAPILLARY: 160 mg/dL — AB (ref 65–99)
GLUCOSE-CAPILLARY: 96 mg/dL (ref 65–99)
Glucose-Capillary: 122 mg/dL — ABNORMAL HIGH (ref 65–99)
Glucose-Capillary: 136 mg/dL — ABNORMAL HIGH (ref 65–99)
Glucose-Capillary: 134 mg/dL — ABNORMAL HIGH (ref 65–99)

## 2015-10-20 NOTE — Progress Notes (Signed)
Pharmacy Antibiotic Note  59 YOF continues on for CoNS bacteremia, MRSA PNA, and infected rectus hematoma.  Plan: Continue Zosyn 3.375gm IV Q8H, 4 hr infusion Continue vancomycin  IV Q12H Monitor clinical picture, renal function, weekly VT to r/o accumulation FU susceptibilities of enterococcus is amp sensitive try to de-escalate to unasyn  Height:  (170.2 cm) Weight: 254 lb (115.214 kg) IBW/kg (Calculated) : 61.6  Temp (24hrs), Avg:98.7 F (37.1 C), Min:97.9 F (36.6 C), Max:100.1 F (37.8 C)   Recent Labs Lab 10/13/15 1040 10/14/15 0314 10/14/15 1323 10/15/15 0343 10/15/15 0623 10/16/15 0514 10/17/15 0759 10/19/15 0315 10/20/15 0406  WBC 15.0* 10.8*  --   --  12.2*  --   --   --   --   CREATININE 1.18* 1.33*  --  1.23*  --  1.26*  --  1.22* 1.12*  VANCOTROUGH  --   --  27*  --   --   --  17  --   --     Estimated Creatinine Clearance: 70 mL/min (by C-G formula based on Cr of 1.12).    No Known Allergies  Antimicrobials this admission: Vanc 2/7 >> Ceftaz 2/10 >> 2/12 Zosyn 2/17 >>  Dose adjustments this admission: 2/11 VT = 13 on  IV Q24 2/14 VT = 21 on  IV Q12 (Drawn ~1 hr early but ok) 2/18 VT = 27 on  IV Q12 (Drawn about 1 hr early but true trough will still be elevated) 2/21 VT = 17 on 500 mg IV Q 12   Microbiology results: 2/22 stomach wound - Enterococcus  2/14 Blood - NEG  2/10 TA - MRSA 2/9 Blood - NGTD 2/4 Blood 2/2 CoNS 2/4 TA - MRSA 2/2 Blood -NEG 2/2 TA - NEG 2/2 Urine - NEG 2/2 MRSA - NEG  Isaac Bliss, PharmD, BCPS, [facility] Clinical Pharmacist Pager [phone] 10/20/2015 10:38 AM

## 2015-10-20 NOTE — Progress Notes (Signed)
STROKE TEAM PROGRESS NOTE   SUBJECTIVE (INTERVAL HISTORY)  Ok to use PEG per trauma team. Palliative meeting yesterday with  Family who is also coming [facility] due to planned planned placement in vent SNF there. Code changed to limited. Patient was pulling on vent, soft restraint placed left arm.    OBJECTIVE Temp:  [97.9 F (36.6 C)-100.1 F (37.8 C)] 100.1 F (37.8 C) (02/24 0815) Pulse Rate:  [66-92] 91 (02/24 0815) Cardiac Rhythm:  [-] Normal sinus rhythm (02/24 0700) Resp:  [9-27] 27 (02/24 0815) BP: (87-140)/(49-75) 137/50 mmHg (02/24 0815) SpO2:  [98 %-100 %] 100 % (02/24 0815) FiO2 (%):  [30 %] 30 % (02/24 0757) Weight:  [115.214 kg (254 lb)] 115.214 kg (254 lb) (02/24 0440)  CBC:   Recent Labs Lab 10/14/15 0314 10/15/15 0623  WBC 10.8* 12.2*  HGB 7.5* 8.2*  HCT 24.2* 25.4*  MCV 94.5 96.6  PLT 381 436*    Basic Metabolic Panel:   Recent Labs Lab 10/19/15 0315 10/20/15 0406  NA 145 143  K 4.5 4.1  CL 109 110  CO2 24 23  GLUCOSE 142* 115*  BUN 36* 34*  CREATININE 1.22* 1.12*  CALCIUM 8.4* 8.4*    Lipid Panel:     Component Value Date/Time   CHOL 170 09/28/2015 0938   TRIG 245* 10/04/2015 0841   HDL 38* 09/28/2015 0938   CHOLHDL 4.5 09/28/2015 0938   VLDL UNABLE TO CALCULATE IF TRIGLYCERIDE OVER 400 mg/dL 16/05/9603 5409   LDLCALC UNABLE TO CALCULATE IF TRIGLYCERIDE OVER 400 mg/dL 81/19/1478 2956   OZHY8M:  Lab Results  Component Value Date   HGBA1C 6.9* 09/28/2015   Urine Drug Screen:     Component Value Date/Time   LABOPIA NONE DETECTED 09/28/2015 0226   LABBENZ NONE DETECTED 09/28/2015 0226   AMPHETMU NONE DETECTED 09/28/2015 0226   THCU NONE DETECTED 09/28/2015 0226   LABBARB NONE DETECTED 09/28/2015 0226      IMAGING  MRI of the brain without contrast 09/28/2015 Large acute hemorrhagic left hemispheric infarct involves the majority of the left middle cerebral artery distribution. Hemorrhage is most notable involving  the left posterior temporal -parietal aspect of this acute infarct. Local mass effect upon the left lateral ventricle. At most there is minimal bowing of the septum to the right.  Several small acute nonhemorrhagic right hemispheric infarcts involving right caudate head, superior aspect of the post limb right internal capsule, right frontal lobe, superior right lenticular nucleus and right occipital lobe.  Several small acute nonhemorrhagic cerebellar infarcts greater on the right.  Remote left lenticular nucleus infarct or hematoma with blood-stained cleft now noted.  Moderate to marked chronic small vessel disease changes.  Atrophy.  Portable chest x-ray 10/18/2015 Contrast material within stomach without contrast extravasation. Percutaneous gastrostomy tube within proximal stomach.    Physical exam Exam stable today 10/20/2015  General - Well nourished, well developed, s/p trach and PEG.  Ophthalmologic - Fundi not visualized due to noncooperation.  Cardiovascular - Regular rhythm, but tachycardia. No M/R/G  Neruo - Opens eyes to sternal rub and loud voice,  Nonverbal, not following commands, grimaces to sternal run and pain, s/p trach and peg, on ventilation. Not following any commands. PERRL, left gaze preference, Positive corneal and gag. Right facial droop. tongue in middle inside mouth. Spontaneous movements left arm and leg. Localizes left arm.  Brisk w/d LUE and LLE on pain stimulation. RUE and RLE withdraws less than left side to pain, with  increased tone. DTR 1+, toes equiv.   ASSESSMENT/PLAN Linda [name] is a 60 y.o. female with history of left BG ICH in 2012, hypertension, cocaine abuse, renal failure and hepatitis C presenting with altered mental status, right side weakness and leftward gaze. Also concerning for possible seizure activity initial presentation. She did not receive IV t-PA due to hx ICH, CTA showed a L M2 occlusion, she was sent to IR  where she received complete TICI3 revascularization of the L MCA with mechanical thrombectomy with trevo and IA Integrilin.  Stroke:  left MCA infarct with hemorrhagic transformation s/p revascularizationf of L M2, multifocal scattered infarcts involving bilateral posterior and anterior circulation, consistent with cardioembolic source although pt does have significant large vessel intracranial atherosclerosis as well as cocaine abuse.  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  CTA neck L thyroid nodule  2D Echo  EF 55-60%  LE venous doppler negative for DVT  LDL unable to calculate due to high TG   HgbA1c 6.9  UDS positive for cocaine  Heparin subq for VTE prophylaxis Diet NPO time specified  No antithrombotic prior to admission, now on ASA .   Ongoing aggressive stroke risk factor management  Therapy recommendations:  SNF with vent support  Disposition: SNF with vent support  Palliative care consulted for goals of care -scheduled for Wednesday  Family applying for [facility] Wednesday, coming to [facility] for appointment with planned vent SNF placement in [place]   Bacteremia   Positive blood Cx 10/05/15 G+ in cluster with coagulase negative 2/2  Leukocytosis WBC 12.2 on 10/15/15  Sensitive to vanco  ID recs appreciated, recommend line holidays.   Central line changed IJ to PICC  Presumed endocarditis but hold off further work up due to limited functional improvement so far and likely poor prognosis  Anemia   Hb 8.3-6.8->8.3->8.8 -> 7.9 -> 7.5 -> 8.2  S/p blood transfusion  Likely due to PEG  tube site bleeding  Trauma Team on board, appreciate their help  Stool guaiac  (negative 10/12/2015)  Hx of ICH  2012 left BG ICH; during hospital course:  BP high at 200s  Urine positive with cocaine   S/p trach and PEG at that time  Lost follow up since  Acute respiratory failure  Intubated in ED due to airway protection  S/p trach, not able to wean off ventilation  On Risperdal and clonidine patch for agitation, scheduled ativan tapered off  Sputum culture continue to show MRSA 10/06/15  Treated with vancomycin   CCM following  DM  A1C 6.9 on admission  Required high dose SSI  Increase lantus to 30 units bid  CBG monitoring Q6  Glucose much improved  ? Seizure  Questionable seizure presentation initially as reported in chart  EEG showed no seizure but under sedation  Hold off AEDs at this time  No seizure activity since admission  Hypernatremia, resolved  Na 152->151->149->148->143->143 -> 138 ->140->141->145 -> 143  Hypertensive Emergency  BP as high as 202/124  Treated with cardene drip, now off  BP Stable   On clonidine patch  Hyperlipidemia  Home meds:  No statin  LDL not able to calculate  due to high TG , goal < 70  Total chol 170  on lipitor   Continue statin at discharge  Dysphagia   Hx Dysphagia secondary to ICH in 2012 with PEG  S/p PEG  On TF  Bowel regimen  Cocaine abuse  urine positive both in 2012 and this admission  Likely one of the causes of intracranial vascular stenosis  Other Stroke Risk Factors  Obesity, Body mass index is 39.77 kg/(m^2).   Other Active Problems  Hepatitis C  Renal insufficiency - improving - BUN 34 / Cre 1.12 on 10/20/2015  Thyroid abnormality - f/u recommended.  Edematous   New decub ulcer on the buttocks, cannot determine stage    Hospital day # 22      Personally examined patient and images, and have participated in and made any corrections needed to  history, physical, neuro exam,assessment and plan as stated above.  I have personally obtained the history, evaluated lab data, reviewed imaging studies and agree with radiology interpretations. Team to evaluate PEG tube if nonfunctional may need temporary panda tube. Discussed with Child psychotherapist and nurse and Dr. Janee Morn from trauma team  This patient is critically ill due to large left MCA infarct with left M2 occlusion s/p intervention, hemorrhagic transformation, bacteremia, acute blood loss anemia, PEG tube site bleeding and infection, acute resp failure s/p trach, hypervolemia, renal insufficiency,  new decubitus ulcers,  and at significant risk of neurological worsening, death form recurrent infarcts, increased ICH, brain herniation, cerebral edema, heart failure and sepsis. This patient's care requires constant monitoring of vital signs, hemodynamics, respiratory and cardiac monitoring, review of multiple databases, neurological assessment, discussion with family, other specialists and medical decision making of high complexity. I spent 30 minutes of neurocritical care time in the care of this patient.   Naomie Dean, MD Stroke Neurology [phone] [facility]             To contact Stroke Continuity provider, please refer to [email or web address]. After hours, contact General Neurology

## 2015-10-20 NOTE — Clinical Social Work Note (Signed)
CSW continuing to follow patient's progress.  Linda [name]. [name], MSW, [name] [phone] 10/20/2015 5:41 PM

## 2015-10-21 LAB — WOUND CULTURE

## 2015-10-21 LAB — GLUCOSE, CAPILLARY
GLUCOSE-CAPILLARY: 97 mg/dL (ref 65–99)
GLUCOSE-CAPILLARY: 79 mg/dL (ref 65–99)
GLUCOSE-CAPILLARY: 72 mg/dL (ref 65–99)
GLUCOSE-CAPILLARY: 100 mg/dL — AB (ref 65–99)
Glucose-Capillary: 110 mg/dL — ABNORMAL HIGH (ref 65–99)
Glucose-Capillary: 115 mg/dL — ABNORMAL HIGH (ref 65–99)

## 2015-10-21 NOTE — Progress Notes (Signed)
STROKE TEAM PROGRESS NOTE   SUBJECTIVE  Palliative care to meet with family on Wednesday.  Niece at the bedside seemed "optimistic" when patient was noted to open her eyes.  I explained that her exam was otherwise unchanges  OBJECTIVE Temp:  [98.7 F (37.1 C)-100.1 F (37.8 C)] 98.7 F (37.1 C) (02/25 0400) Pulse Rate:  [74-100] 97 (02/25 0600) Cardiac Rhythm:  [-] Normal sinus rhythm (02/24 1900) Resp:  [14-31] 25 (02/25 0600) BP: (110-145)/(50-81) 121/70 mmHg (02/25 0600) SpO2:  [94 %-100 %] 98 % (02/25 0600) FiO2 (%):  [30 %] 30 % (02/25 0400) Weight:  [105.235 kg (232 lb)] 105.235 kg (232 lb) (02/25 0441)  CBC:   Recent Labs Lab 10/15/15 0623  WBC 12.2*  HGB 8.2*  HCT 25.4*  MCV 96.6  PLT 436*    Basic Metabolic Panel:   Recent Labs Lab 10/19/15 0315 10/20/15 0406  NA 145 143  K 4.5 4.1  CL 109 110  CO2 24 23  GLUCOSE 142* 115*  BUN 36* 34*  CREATININE 1.22* 1.12*  CALCIUM 8.4* 8.4*    Lipid Panel:     Component Value Date/Time   CHOL 170 09/28/2015 0938   TRIG 245* 10/04/2015 0841   HDL 38* 09/28/2015 0938   CHOLHDL 4.5 09/28/2015 0938   VLDL UNABLE TO CALCULATE IF TRIGLYCERIDE OVER 400 mg/dL 16/05/9603 5409   LDLCALC UNABLE TO CALCULATE IF TRIGLYCERIDE OVER 400 mg/dL 81/19/1478 2956   OZHY8M:  Lab Results  Component Value Date   HGBA1C 6.9* 09/28/2015   Urine Drug Screen:     Component Value Date/Time   LABOPIA NONE DETECTED 09/28/2015 0226   LABBENZ NONE DETECTED 09/28/2015 0226   AMPHETMU NONE DETECTED 09/28/2015 0226   THCU NONE DETECTED 09/28/2015 0226   LABBARB NONE DETECTED 09/28/2015 0226      IMAGING  MRI of the brain without contrast 09/28/2015 Large acute hemorrhagic left hemispheric infarct involves the majority of the left middle cerebral artery distribution. Hemorrhage is most notable involving the left posterior temporal -parietal aspect of this acute infarct. Local mass effect upon the left lateral ventricle. At most  there is minimal bowing of the septum to the right.  Several small acute nonhemorrhagic right hemispheric infarcts involving right caudate head, superior aspect of the post limb right internal capsule, right frontal lobe, superior right lenticular nucleus and right occipital lobe.  Several small acute nonhemorrhagic cerebellar infarcts greater on the right.  Remote left lenticular nucleus infarct or hematoma with blood-stained cleft now noted.  Moderate to marked chronic small vessel disease changes.  Atrophy.  Portable chest x-ray 10/18/2015 Contrast material within stomach without contrast extravasation. Percutaneous gastrostomy tube within proximal stomach.    Physical exam General - Well nourished, well developed, s/p trach and PEG. HEENT:  NCAT; PEERL; dry mucous membranes Cardiovascular - Regular rhythm, but tachycardia. No M/R/G Chest:  Scattered crackles Abd:  Benign Ext:  No C/C/E  Neruo - Today eyes open spontaneously.  Nonverbal, not following commands, grimaces to sternal run and pain, s/p trach and peg, on ventilation.   PERRL, left gaze preference, Positive corneal and gag. Right facial droop.   Spontaneous movements left arm and leg. Localizes left arm.  Brisk w/d LUE and LLE on pain stimulation. RUE and RLE withdraws less than left side to pain, with increased tone. DTR 1+  Grimace is noted with noxious stimuli on the right   ASSESSMENT/PLAN Ms. MALESSA ZARTMAN is a 60 y.o. female with history of left  BG ICH in 2012, hypertension, cocaine abuse, renal failure and hepatitis C presenting with altered mental status, right side weakness and leftward gaze. Also concerning for possible seizure activity initial presentation. She did not receive IV t-PA due to hx ICH, CTA showed a L M2 occlusion, she was sent to IR where she received complete TICI3 revascularization of the L MCA with mechanical thrombectomy with trevo and IA Integrilin.  Stroke:  left MCA  infarct with hemorrhagic transformation s/p revascularizationf of L M2, multifocal scattered infarcts involving bilateral posterior and anterior circulation, consistent with cardioembolic source although pt does have significant large vessel intracranial atherosclerosis as well as cocaine abuse.  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  CTA neck L thyroid nodule  2D Echo  EF 55-60%  LE venous doppler negative for DVT  LDL unable to calculate due to high TG   HgbA1c 6.9  UDS positive for cocaine  Heparin subq for VTE prophylaxis Diet NPO time specified  No antithrombotic prior to admission, now on ASA 325mg .   Ongoing aggressive stroke risk factor management  Therapy recommendations:  SNF with vent support  Disposition: SNF with vent support  Palliative care consulted for goals of care -scheduled for Wednesday  Family applying for [facility] Wednesday, coming to [facility] for appointment with planned vent SNF placement in [place]   Bacteremia   Positive blood Cx 10/05/15 G+ in cluster with coagulase negative 2/2  Leukocytosis WBC 12.2 on 10/15/15  Sensitive to vanco  ID recs appreciated, recommend line holidays.   Central line changed IJ to PICC  Presumed endocarditis but hold off further work up due to limited functional improvement so far and likely poor prognosis  Anemia   Hb 8.3-6.8->8.3->8.8 -> 7.9 -> 7.5 -> 8.2  S/p blood transfusion  Likely due to PEG tube site bleeding  Trauma Team on board, appreciate their help  Stool guaiac  (negative 10/12/2015)  Hx of ICH  2012 left BG ICH; during  hospital course:  BP high at 200s  Urine positive with cocaine   S/p trach and PEG at that time  Lost follow up since  Acute respiratory failure  Intubated in ED due to airway protection  S/p trach, not able to wean off ventilation  On Risperdal and clonidine patch for agitation, scheduled ativan tapered off  Sputum culture continue to show MRSA 10/06/15  Treated with vancomycin   CCM following  DM  A1C 6.9 on admission  Required high dose SSI  Increase lantus to 30 units bid  CBG monitoring Q6  Glucose much improved  ? Seizure  Questionable seizure presentation initially as reported in chart  EEG showed no seizure but under sedation  Hold off AEDs at this time  No seizure activity since admission  Hypernatremia, resolved  Na 152->151->149->148->143->143 -> 138 ->140->141->145 -> 143  Hypertensive Emergency  BP as high as 202/124  Treated with cardene drip, now off  BP Stable   On clonidine patch  Hyperlipidemia  Home meds:  No statin  LDL not able to calculate due to high TG , goal < 70  Total chol 170  On lipitor 10mg   Continue statin at discharge  Dysphagia   Hx Dysphagia secondary to ICH in 2012 with PEG  S/p PEG 10/04/2015 Complicated by bleeding and infection  On TF  Bowel regimen  Cocaine abuse  Urine positive both in 2012 and this admission  Likely one of the causes of intracranial vascular stenosis  Other Stroke Risk Factors  Obesity, Body mass index is 36.33 kg/(m^2).   Other Active Problems  Hepatitis C  Renal insufficiency - improving - BUN 34 / Cre 1.12 on 10/20/2015  Thyroid abnormality - f/u recommended.  Edematous   New decub ulcer on the buttocks, cannot determine stage    Hospital day # 23      Personally examined patient and images, and have participated in and made any corrections needed to history, physical, neuro exam,assessment and plan as stated above.  I have personally obtained the  history, evaluated lab data, reviewed imaging studies and agree with radiology interpretations.   This patient is critically ill due to large left MCA infarct with left M2 occlusion s/p intervention, hemorrhagic transformation, bacteremia, acute blood loss anemia, PEG tube site bleeding and infection, acute resp failure s/p trach, hypervolemia, renal insufficiency,  new decubitus ulcers,  and at significant risk of neurological worsening, death form recurrent infarcts, increased ICH, brain herniation, cerebral edema, heart failure and sepsis. This patient's care requires constant monitoring of vital signs, hemodynamics, respiratory and cardiac monitoring, review of multiple databases, neurological assessment, discussion with family, other specialists and medical decision making of high complexity. I spent 30 minutes of neurocritical care time in the care of this patient.  If patient remains difficult to wean, family will need to understand the long-term prognosis   Sula Soda, MD [facility]             To contact Stroke Continuity provider, please refer to [email or web address]. After hours, contact General Neurology

## 2015-10-21 NOTE — Plan of Care (Signed)
Problem: Coping: Goal: Ability to verbalize positive feelings about self will improve Patient continuing to be more responsive and awake spontaneously.  Safety mitt to left hand so, the patient won't pull on her trach/ventilator setup.

## 2015-10-22 LAB — GLUCOSE, CAPILLARY
GLUCOSE-CAPILLARY: 97 mg/dL (ref 65–99)
GLUCOSE-CAPILLARY: 65 mg/dL (ref 65–99)
GLUCOSE-CAPILLARY: 94 mg/dL (ref 65–99)
Glucose-Capillary: 127 mg/dL — ABNORMAL HIGH (ref 65–99)
Glucose-Capillary: 117 mg/dL — ABNORMAL HIGH (ref 65–99)

## 2015-10-22 LAB — CBC
HCT: 28.3 % — ABNORMAL LOW (ref 36.0–46.0)
Hemoglobin: 9.1 g/dL — ABNORMAL LOW (ref 12.0–15.0)
MCH: 31.1 pg (ref 26.0–34.0)
MCHC: 32.2 g/dL (ref 30.0–36.0)
MCV: 96.6 fL (ref 78.0–100.0)
PLATELETS: 395 10*3/uL (ref 150–400)
RBC: 2.93 MIL/uL — ABNORMAL LOW (ref 3.87–5.11)
RDW: 15 % (ref 11.5–15.5)
WBC: 13.3 10*3/uL — AB (ref 4.0–10.5)

## 2015-10-22 LAB — BASIC METABOLIC PANEL
Anion gap: 9 (ref 5–15)
BUN: 34 mg/dL — AB (ref 6–20)
CALCIUM: 8.5 mg/dL — AB (ref 8.9–10.3)
CO2: 27 mmol/L (ref 22–32)
CREATININE: 1.15 mg/dL — AB (ref 0.44–1.00)
Chloride: 107 mmol/L (ref 101–111)
GFR, EST AFRICAN AMERICAN: 59 mL/min — AB (ref >60)
GFR, EST NON AFRICAN AMERICAN: 51 mL/min — AB (ref >60)
Glucose, Bld: 98 mg/dL (ref 65–99)
Potassium: 4 mmol/L (ref 3.5–5.1)
SODIUM: 143 mmol/L (ref 135–145)

## 2015-10-22 LAB — PHOSPHORUS: PHOSPHORUS: 4.4 mg/dL (ref 2.5–4.6)

## 2015-10-22 LAB — MAGNESIUM: Magnesium: 2.1 mg/dL (ref 1.7–2.4)

## 2015-10-22 MED ORDER — SODIUM CHLORIDE 0.9 % IV SOLN
3.0000 g | Freq: Four times a day (QID) | INTRAVENOUS | Status: DC
  Administered 2015-10-22 – 2015-10-25 (×11): 3 g via INTRAVENOUS
  Filled 2015-10-22 (×14): qty 3

## 2015-10-22 MED ORDER — INSULIN GLARGINE 100 UNIT/ML ~~LOC~~ SOLN
25.0000 [IU] | Freq: Two times a day (BID) | SUBCUTANEOUS | Status: DC
  Administered 2015-10-23: 25 [IU] via SUBCUTANEOUS
  Filled 2015-10-22 (×3): qty 0.25

## 2015-10-22 NOTE — Progress Notes (Signed)
Patient changed to Unasyn from Zosyn and continued on vancomycin per pharmacy recommendations. Monitor temperature and WBCs. WBCs today 13.3. CBC pending for tomorrow, Monday. Temp 98.5 axillary today.  Delton See PA-C [facility] Pager [phone] 10/22/2015, 1:57 PM

## 2015-10-22 NOTE — Progress Notes (Signed)
STROKE TEAM PROGRESS NOTE   SUBJECTIVE  Nursing team at bedside.  No family at bedside.  No events overnight.  Patient did receive a dose of Fentanyl this AM at around 7AM for increased work of breathing.  She is currently on a weaning trial and RN reports that she has appeared comfortable since the Fentanyl.  In addition, RN reports increased increased secretions noted overnight.  Also, there has been less leakage around the PEG tub.   OBJECTIVE Temp:  [98.1 F (36.7 C)-100 F (37.8 C)] 99.8 F (37.7 C) (02/26 0336) Pulse Rate:  [76-105] 97 (02/26 0746) Cardiac Rhythm:  [-] Sinus tachycardia (02/26 0700) Resp:  [1-27] 22 (02/26 0746) BP: (102-151)/(52-94) 151/61 mmHg (02/26 0746) SpO2:  [89 %-100 %] 97 % (02/26 0746) FiO2 (%):  [30 %] 30 % (02/26 0746) Weight:  [110.224 kg (243 lb)] 110.224 kg (243 lb) (02/26 0336)  CBC:  No results for input(s): WBC, NEUTROABS, HGB, HCT, MCV, PLT in the last 168 hours.  Basic Metabolic Panel:   Recent Labs Lab 10/20/15 0406 10/22/15 0510  NA 143 143  K 4.1 4.0  CL 110 107  CO2 23 27  GLUCOSE 115* 98  BUN 34* 34*  CREATININE 1.12* 1.15*  CALCIUM 8.4* 8.5*    Lipid Panel:     Component Value Date/Time   CHOL 170 09/28/2015 0938   TRIG 245* 10/04/2015 0841   HDL 38* 09/28/2015 0938   CHOLHDL 4.5 09/28/2015 0938   VLDL UNABLE TO CALCULATE IF TRIGLYCERIDE OVER 400 mg/dL 95/18/8416 6063   LDLCALC UNABLE TO CALCULATE IF TRIGLYCERIDE OVER 400 mg/dL 01/60/1093 2355   DDUK0U:  Lab Results  Component Value Date   HGBA1C 6.9* 09/28/2015   Urine Drug Screen:     Component Value Date/Time   LABOPIA NONE DETECTED 09/28/2015 0226   LABBENZ NONE DETECTED 09/28/2015 0226   AMPHETMU NONE DETECTED 09/28/2015 0226   THCU NONE DETECTED 09/28/2015 0226   LABBARB NONE DETECTED 09/28/2015 0226    IMAGING  MRI of the brain without contrast 09/28/2015 Large acute hemorrhagic left hemispheric infarct involves the majority of the left middle  cerebral artery distribution. Hemorrhage is most notable involving the left posterior temporal -parietal aspect of this acute infarct. Local mass effect upon the left lateral ventricle. At most there is minimal bowing of the septum to the right. Several small acute nonhemorrhagic right hemispheric infarcts involving right caudate head, superior aspect of the post limb right internal capsule, right frontal lobe, superior right lenticular nucleus and right occipital lobe. Several small acute nonhemorrhagic cerebellar infarcts greater on the right. Remote left lenticular nucleus infarct or hematoma with blood-stained cleft now noted. Moderate to marked chronic small vessel disease changes. Atrophy.  Portable chest x-ray 10/18/2015 Contrast material within stomach without contrast extravasation. Percutaneous gastrostomy tube within proximal stomach.  Physical exam General - Well nourished, well developed, s/p trach and PEG. HEENT:  NCAT; PEERL; dry mucous membranes Cardiovascular - Regular rhythm, but tachycardia. No M/R/G Chest:  Scattered crackles Abd:  Benign Ext:  No C/C/E  Neuro -   Mental Status:  Today did not spontaneously open eyes.  Exam directly after Fentanyl given for comfort.  Nonverbal, not following commands  Cranial Nerves:  PERRL, left gaze preference, Positive corneal and gag. Right facial droop.   Motor:  Spontaneous movements of left arm and leg. Localizes left arm.  Brisk w/d LUE and LLE on pain stimulation.   RUE and RLE withdraws less than left side to  pain, with increased tone. DTR 1+  Exam concerning for development of contracture bilaterally.   Sensory:  Grimace is noted with noxious stimuli bilaterally   ASSESSMENT/PLAN Ms. LORAL CAMPI is a 60 y.o. female with history of left BG ICH in 2012, hypertension, cocaine abuse, renal failure and hepatitis C presenting with altered mental status, right side weakness and leftward gaze. Also concerning for  possible seizure activity initial presentation. She did not receive IV t-PA due to hx ICH, CTA showed a L M2 occlusion, she was sent to IR where she received complete TICI3 revascularization of the L MCA with mechanical thrombectomy with trevo and IA Integrilin.  Stroke:  left MCA infarct with hemorrhagic transformation s/p revascularizationf of L M2, multifocal scattered infarcts involving bilateral posterior and anterior circulation, consistent with cardioembolic source although pt does have significant large vessel intracranial atherosclerosis as well as cocaine abuse.  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  CTA neck L thyroid nodule  2D Echo  EF 55-60%  LE venous doppler negative for DVT  LDL unable to calculate due to high TG   HgbA1c 6.9  UDS positive for cocaine  Heparin subq for VTE prophylaxis Diet NPO time specified  No antithrombotic prior to admission, now on ASA .   Ongoing aggressive stroke risk factor management  Therapy recommendations:  SNF with vent support  Disposition: SNF with vent support  Palliative care consulted for goals of care -scheduled for Wednesday  Family applying for [facility] Wednesday, coming to [facility] for appointment with planned vent SNF placement in [place]. When medically indicated, Critical Care team will continue efforts to wean prior to this  Bacteremia   Positive blood Cx 10/05/15 G+ in cluster with coagulase negative 2/2  Leukocytosis WBC 12.2 on 10/15/15  Sensitive to vanco  ID recs appreciated, recommend line holidays.   Central line changed IJ to  PICC  Presumed endocarditis.  During hospital course, decision was made to hold off further work up due to limited functional improvement so far and likely poor prognosis  Anemia   Hb 8.3-6.8->8.3->8.8 -> 7.9 -> 7.5 -> 8.2  S/p blood transfusion  Likely due to PEG tube site bleeding  Trauma Team on board, appreciate their help  Stool guaiac  (negative 10/12/2015)  Hx of ICH  2012 left BG ICH; during hospital course:  BP high at 200s  Urine positive with cocaine   S/p trach and PEG at that time  Lost follow up since  Acute respiratory failure  Intubated in ED due to airway protection  S/p trach, not able to wean off ventilation  On Risperdal and clonidine patch for agitation, scheduled ativan tapered off  Sputum culture continue to show MRSA 10/06/15  Treated with vancomycin   CCM following  DM  A1C 6.9 on admission  Required high dose SSI  Increase lantus to 30 units bid  CBG monitoring Q6  Glucose much improved  ? Seizure  Questionable seizure presentation initially as reported in chart  EEG showed no seizure but under sedation  Hold off AEDs at this time  No seizure activity since admission  Hypernatremia, resolved  Na 152->151->149->148->143->143 -> 138 ->140->141->145 ->  143->143  Hypertensive Emergency  BP as high as 202/124  Treated with cardene drip, now off  BP Stable   On clonidine patch  Hyperlipidemia  Home meds:  No statin  LDL not able to calculate due to high TG , goal < 70  Total chol 170  On lipitor 10mg   Continue statin at discharge  Dysphagia   Hx Dysphagia secondary to ICH in 2012 with PEG  S/p PEG 10/04/2015 Complicated by bleeding and infection  On TF  Bowel regimen  Cocaine abuse  Urine positive both in 2012 and this admission  Likely one of the causes of intracranial vascular stenosis  Other Stroke Risk Factors  Obesity, Body mass index is 38.05 kg/(m^2).   Other Active  Problems  Hepatitis C  Renal insufficiency - improving - BUN 34 / Cre 1.15 on 10/22/2015  Thyroid abnormality - f/u recommended.  Edematous   New decub ulcer on the buttocks, cannot determine stage    Hospital day # 24    Personally examined patient and images, and have participated in and made any corrections needed to history, physical, neuro exam,assessment and plan as stated above.  I have personally obtained the history, evaluated lab data, reviewed imaging studies and agree with radiology interpretations.   This patient is critically ill due to large left MCA infarct with left M2 occlusion s/p intervention, hemorrhagic transformation, bacteremia, acute blood loss anemia, PEG tube site bleeding and infection, acute resp failure s/p trach, hypervolemia, renal insufficiency,  new decubitus ulcers,  and at significant risk of neurological worsening, death form recurrent infarcts, increased ICH, brain herniation, cerebral edema, heart failure and sepsis. This patient's care requires constant monitoring of vital signs, hemodynamics, respiratory and cardiac monitoring, review of multiple databases, neurological assessment, discussion with family, other specialists and medical decision making of high complexity. I spent 30 minutes of neurocritical care time in the care of this patient.  Today:  Will follow-up on pending CBC, Phos, magnesium  Critical care continuing weaning trials  Sula Soda, MD MHS             To contact Stroke Continuity provider, please refer to [email or web address]. After hours, contact General Neurology

## 2015-10-22 NOTE — Plan of Care (Signed)
Problem: Pain Managment: Goal: General experience of comfort will improve Outcome: Progressing Pain having increased grimacing with more alertness. Will use PRN order from [facility] to manage.

## 2015-10-22 NOTE — Progress Notes (Signed)
Pt is off wean at this time, pt is on FS tolerating it well.

## 2015-10-22 NOTE — Progress Notes (Signed)
Pt is resting comfortably at this time no distress noted.  

## 2015-10-23 ENCOUNTER — Inpatient Hospital Stay (HOSPITAL_COMMUNITY): Payer: Medicaid Other

## 2015-10-23 LAB — GLUCOSE, CAPILLARY
GLUCOSE-CAPILLARY: 50 mg/dL — AB (ref 65–99)
GLUCOSE-CAPILLARY: 76 mg/dL (ref 65–99)
GLUCOSE-CAPILLARY: 85 mg/dL (ref 65–99)
Glucose-Capillary: 106 mg/dL — ABNORMAL HIGH (ref 65–99)
Glucose-Capillary: 120 mg/dL — ABNORMAL HIGH (ref 65–99)
Glucose-Capillary: 76 mg/dL (ref 65–99)
Glucose-Capillary: 81 mg/dL (ref 65–99)
Glucose-Capillary: 96 mg/dL (ref 65–99)

## 2015-10-23 LAB — CBC
HEMATOCRIT: 25.6 % — AB (ref 36.0–46.0)
Hemoglobin: 7.9 g/dL — ABNORMAL LOW (ref 12.0–15.0)
MCH: 29.9 pg (ref 26.0–34.0)
MCHC: 30.9 g/dL (ref 30.0–36.0)
MCV: 97 fL (ref 78.0–100.0)
Platelets: 505 10*3/uL — ABNORMAL HIGH (ref 150–400)
RBC: 2.64 MIL/uL — ABNORMAL LOW (ref 3.87–5.11)
RDW: 14.9 % (ref 11.5–15.5)
WBC: 7.8 10*3/uL (ref 4.0–10.5)

## 2015-10-23 LAB — BASIC METABOLIC PANEL
ANION GAP: 10 (ref 5–15)
BUN: 33 mg/dL — ABNORMAL HIGH (ref 6–20)
CALCIUM: 8.6 mg/dL — AB (ref 8.9–10.3)
CO2: 26 mmol/L (ref 22–32)
CREATININE: 1.07 mg/dL — AB (ref 0.44–1.00)
Chloride: 106 mmol/L (ref 101–111)
GFR, EST NON AFRICAN AMERICAN: 55 mL/min — AB (ref >60)
Glucose, Bld: 131 mg/dL — ABNORMAL HIGH (ref 65–99)
Potassium: 3.6 mmol/L (ref 3.5–5.1)
SODIUM: 142 mmol/L (ref 135–145)

## 2015-10-23 LAB — PHOSPHORUS: PHOSPHORUS: 4 mg/dL (ref 2.5–4.6)

## 2015-10-23 LAB — LIPID PANEL
CHOL/HDL RATIO: 5.1 ratio
Cholesterol: 154 mg/dL (ref 0–200)
HDL: 30 mg/dL — ABNORMAL LOW (ref >40)
LDL Cholesterol: 82 mg/dL (ref 0–99)
Triglycerides: 212 mg/dL — ABNORMAL HIGH (ref <150)
VLDL: 42 mg/dL — AB (ref 0–40)

## 2015-10-23 LAB — MAGNESIUM: MAGNESIUM: 2.1 mg/dL (ref 1.7–2.4)

## 2015-10-23 MED ORDER — ATORVASTATIN CALCIUM 20 MG PO TABS
20.0000 mg | ORAL_TABLET | Freq: Every day | ORAL | Status: DC
  Administered 2015-10-24: 20 mg via ORAL
  Filled 2015-10-23: qty 1

## 2015-10-23 MED ORDER — INSULIN GLARGINE 100 UNIT/ML ~~LOC~~ SOLN
30.0000 [IU] | Freq: Every day | SUBCUTANEOUS | Status: DC
  Administered 2015-10-23 – 2015-10-24 (×2): 30 [IU] via SUBCUTANEOUS
  Filled 2015-10-23 (×3): qty 0.3

## 2015-10-23 MED ORDER — INSULIN GLARGINE 100 UNIT/ML ~~LOC~~ SOLN
20.0000 [IU] | Freq: Two times a day (BID) | SUBCUTANEOUS | Status: DC
  Filled 2015-10-23: qty 0.2

## 2015-10-23 MED ORDER — GLUCOSE 40 % PO GEL
ORAL | Status: AC
Start: 2015-10-23 — End: 2015-10-23
  Administered 2015-10-23: 37.5 g
  Filled 2015-10-23: qty 1

## 2015-10-23 NOTE — Consult Note (Signed)
WOC wound follow up Wound type: MDRPI Measurement: healed Wound bed: reepithelialized  Drainage (amount, consistency, odor) normal trach secretions Periwound: intact  Dressing procedure/placement/frequency: DC topical wound care, can continue with routine trach care.   Discussed POC with bedside nurse.  Re consult if needed, will not follow at this time. Thanks  Kirstan Fentress [employer], CWOCN [phone])

## 2015-10-23 NOTE — Progress Notes (Signed)
Dr Roda Shutters called due to tube feeding leaking on the dressing and in the bed; tube feeding turned off till seen by Trauma

## 2015-10-23 NOTE — Progress Notes (Signed)
CSW spoke with [facility] SN[facility]- they are working on pt admission- awaiting [place] to be finalized  CSW set up transport with [employer] for possible DC  Set for Wednesday march 1st at 10am if everything is ready for patient transfer at that time.  CSW also called [facility] and [facility] to see if anything had changed in their bed status- still no Medicaid beds available  CSW will continue to follow  Merlyn Lot, [facility] Clinical Social Worker [phone]

## 2015-10-23 NOTE — Progress Notes (Signed)
STROKE TEAM PROGRESS NOTE   SUBJECTIVE (INTERVAL HISTORY) No family is at the bedside. No fever for the last 24 hours. Pt awake and eyes open but deviated to the left. Not following commands. WBC normalized. Trach pressure wound healed. Pending transfer to [facility] facility.   OBJECTIVE Temp:  [97.6 F (36.4 C)-98.1 F (36.7 C)] 97.9 F (36.6 C) (02/27 1100) Pulse Rate:  [69-95] 93 (02/27 1100) Cardiac Rhythm:  [-] Normal sinus rhythm (02/27 0705) Resp:  [6-26] 20 (02/27 1100) BP: (82-149)/(54-78) 142/70 mmHg (02/27 1100) SpO2:  [96 %-100 %] 100 % (02/27 1159) FiO2 (%):  [30 %] 30 % (02/27 1159) Weight:  [196 lb (88.905 kg)] 196 lb (88.905 kg) (02/27 0407)  CBC:   Recent Labs Lab 10/22/15 0845 10/23/15 0216  WBC 13.3* 7.8  HGB 9.1* 7.9*  HCT 28.3* 25.6*  MCV 96.6 97.0  PLT 395 505*    Basic Metabolic Panel:   Recent Labs Lab 10/22/15 0510 10/22/15 0845 10/23/15 0216  NA 143  --  142  K 4.0  --  3.6  CL 107  --  106  CO2 27  --  26  GLUCOSE 98  --  131*  BUN 34*  --  33*  CREATININE 1.15*  --  1.07*  CALCIUM 8.5*  --  8.6*  MG  --  2.1 2.1  PHOS  --  4.4 4.0    Lipid Panel:     Component Value Date/Time   CHOL 154 10/23/2015 0216   TRIG 212* 10/23/2015 0216   HDL 30* 10/23/2015 0216   CHOLHDL 5.1 10/23/2015 0216   VLDL 42* 10/23/2015 0216   LDLCALC 82 10/23/2015 0216   HgbA1c:  Lab Results  Component Value Date   HGBA1C 6.9* 09/28/2015   Urine Drug Screen:     Component Value Date/Time   LABOPIA NONE DETECTED 09/28/2015 0226   LABBENZ NONE DETECTED 09/28/2015 0226   AMPHETMU NONE DETECTED 09/28/2015 0226   THCU NONE DETECTED 09/28/2015 0226   LABBARB NONE DETECTED 09/28/2015 0226      IMAGING I have personally reviewed the radiological images below and agree with the radiology interpretations.  Ct Head Wo Contrast 09/28/2015  1. New calcific density in the left sylvian fissure, possible calcific distal M2 embolus. No visible acute infarct  or hemorrhage. 2. Advanced chronic small vessel disease with multiple lacunar infarcts.   CTA NECK 09/28/2015   Moderately motion degraded examination, limited by LEFT venous contamination. No definite acute vascular process or hemodynamically significant stenosis. 3.5 x 5.7 cm dominant LEFT thyroid not oral for which follow up thyroid sonogram is recommended on a nonemergent basis.   CTA HEAD 09/28/2015   LEFT M2 segment occlusion corresponding to sylvian fissure density on today's CT, likely acute. Complete circle of Willis.  Mild intracranial atherosclerosis. Moderate calcific atherosclerosis the carotid siphons.   Cerebral Angiogram 09/28/2015 S/P Lt internal carotid arteriogram followed by complete revascularization of occluded dominant inferior division of LT MCA with x pass with trevoprovue 4 mmx 30 mm retrieval device and 6 mg of superselective Integrelin achieving a TICI 3 flow.  MRI  Large acute hemorrhagic left hemispheric infarct involves the majority of the left middle cerebral artery distribution. Hemorrhage is most notable involving the left posterior temporal -parietal aspect of this acute infarct. Local mass effect upon the left lateral ventricle. At most there is minimal bowing of the septum to the right. Several small acute nonhemorrhagic right hemispheric infarcts involving right caudate head, superior aspect  of the post limb right internal capsule, right frontal lobe, superior right lenticular nucleus and right occipital lobe. Several small acute nonhemorrhagic cerebellar infarcts greater on the right. Remote left lenticular nucleus infarct or hematoma with blood-stained cleft now noted. Moderate to marked chronic small vessel disease changes. Atrophy.  EEG Abnormalities: 1) possible attenuation of sleep structures in the left central region 2) lack of waking state Clinical Interpretation: This EEG is consistent with a sleep/sedation EEG with a possible suggestion of  a left central cerebral dysfunction, though I don't think this is definite by this study.  2D echo - Systolic function was normal. The estimated ejection fraction was in the range of 55% to 60%. Wall motion was normal; there were no regional wall motion abnormalities. Doppler parameters are consistent with abnormal left ventricular relaxation (grade 1 diastolic dysfunction).  LE venous doppler -   no DVT   Dg Chest Port 1 View 10/08/2015  IMPRESSION: Atelectasis left mid lung. Lungs elsewhere clear. No change in cardiac silhouette. Tube and catheter positions as described without pneumothorax.   10/09/2015 IMPRESSION: Slight interval improvement in the pulmonary interstitium. Persistent subsegmental atelectasis in the left mid lung. No pleural effusion or pneumothorax. The support tubes are in reasonable position.   Ct Abdomen Pelvis Wo Contrast 10/12/2015  IMPRESSION: The gastrostomy tube is appropriately positioned and there is a very small amount hemorrhage within the rectus musculature and subcutaneous fat surrounding the tube insertion site. In the lungs, bibasilar atelectasis versus airspace disease is present. Milk-of-calcium bile versus gallstones.  Ultrasound may be helpful.    Physical exam  Temp:  [97.6 F (36.4 C)-98.1 F (36.7 C)] 97.9 F (36.6 C) (02/27 1100) Pulse Rate:  [69-95] 93 (02/27 1100) Resp:  [6-26] 20 (02/27 1100) BP: (82-149)/(54-78) 142/70 mmHg (02/27 1100) SpO2:  [96 %-100 %] 100 % (02/27 1159) FiO2 (%):  [30 %] 30 % (02/27 1159) Weight:  [196 lb (88.905 kg)] 196 lb (88.905 kg) (02/27 0407)  General - Well nourished, well developed, s/p trach and PEG.  Ophthalmologic - Fundi not visualized due to noncooperation.  Cardiovascular - Regular rhythm and rate.  Neruo -  Eyes open with left deviation, nonverbal, s/p trach and peg, on ventilation, not following any commands. PERRL, doll's eye positive to both directions. Positive corneal and gag. Right  facial droop. tongue in middle inside mouth. Spontaneous movement LUE against gravity and mild withdraw of LLE on pain stimulation. RUE and RLE hemiplegia, with increased tone. No babinski. Sensation, gait and coordination not tested.   ASSESSMENT/PLAN [name] is a 60 y.o. female with history of left BG ICH in 2012, hypertension, cocaine abuse, renal failure and hepatitis C presenting with altered mental status, right side weakness and leftward gaze. Also concerning for possible seizure activity initial presentation. She did not receive IV t-PA due to hx ICH, CTA showed a L M2 occlusion, she was sent to IR where she received complete TICI3 revascularization of the L MCA with mechanical thrombectomy with trevo and IA Integrilin.  Stroke:  left MCA infarct with hemorrhagic transformation s/p revascularizationf of L M2, multifocal scattered infarcts involving bilateral posterior and anterior circulation, consistent with cardioembolic source although pt does have significant large vessel intracranial atherosclerosis as well as cocaine abuse.  Resultant  global aphasia and dense right hemiplegia  MRI - multifocal scattered infarcts involving bilateral posterior and anterior circulation, largest at left MCA territory with patchy hemorrhagic transformation on MRI but not on CT  Repeat CT -  left MCA hemorrhagic infarct   CTA head L M2 occlusion, b/l ICA supraclinoid segment significant atherosclerosis, L>R with moderate to severe stenosis.  CTA neck L thyroid nodule  2D Echo  EF 55-60%  LE venous doppler negative for DVT  LDL unable to calculate due to high TG   HgbA1c 6.9  UDS positive for cocaine  Heparin subq for VTE prophylaxis Diet NPO time specified  No antithrombotic prior to admission, now on ASA .   Ongoing aggressive stroke risk factor management  Therapy recommendations:  SNF with vent support  Disposition:  Pending [place] facility   Code status: partial  DNR  Bacteremia   Positive blood Cx 10/05/15 G+ in cluster with coagulase negative 2/2  Negative blood Cx 10/10/15 2/2  Leukocytosis resolved 7.8   ID recs appreciated   Presumed endocarditis but hold off further work up due to limited functional improvement so far and likely poor prognosis  Anemia  Hb 8.3-6.8->8.3->8.8->7.9->9.1->7.9  S/p blood transfusion  Stool guaiac negative  Hx of ICH  2012 left BG ICH; during hospital course:  BP high at 200s  Urine positive with cocaine   S/p trach and PEG at that time  Lost follow up since  Acute respiratory failure  Intubated in ED due to airway protection  S/p trach, not able to wean off ventilation  On Risperdal and clonidine patch for agitation  Sputum culture continue to show MRSA 10/06/15  On vancomycin   CCM following  PEG site wound infection  Culture showed E.Coli  On Unasyn   DM  A1C 6.9 on admission  CBG monitoring Q6  Glucose much improved  Decrease lantus to 30U Qhs  Hypoglycemia once - corrected  Trach site pressure ulcer, stage 2  Wound care on board  Healed after dressing changes  Back to normal trach care  ? Seizure  Questionable seizure presentation initially as reported in chart  EEG showed no seizure but under sedation  Hold off AEDs at this time  No seizure activity since admission  Hypertensive Emergency  BP as high as 202/124  off cardene drip  Stable   On clonidine patch  Hyperlipidemia  Home meds:  No statin  LDL not able to calculate due to high TG , goal < 70  Repeat LDL 82  on lipitor , increase to   Dysphagia   Hx Dysphagia secondary to ICH in 2012 with PEG  S/p PEG  On TF  Bowel regimen  Cocaine abuse  urine positive both in 2012 and this admission  Likely one of the causes of intracranial vascular stenosis  Need cessation counseling   Other Stroke Risk Factors  Obesity  Other Active Problems  Hepatitis  C   Hospital day # 25  This patient is critically ill due to large left MCA infarct with left M2 occlusion s/p intervention, hemorrhagic transformation, bacteremia, acute blood loss anemia, PEG tube site bleeding, constant low-grade fever and at significant risk of neurological worsening, death form recurrent infarcts, increased ICH, brain herniation, cerebral edema, heart failure and sepsis. This patient's care requires constant monitoring of vital signs, hemodynamics, respiratory and cardiac monitoring, review of multiple databases, neurological assessment, discussion with family, other specialists and medical decision making of high complexity. I spent 35 minutes of neurocritical care time in the care of this patient.   Marvel Plan, MD PhD Stroke Neurology 10/23/2015 3:01 PM     To contact Stroke Continuity provider, please refer to [email or web address]. After hours, contact General Neurology

## 2015-10-23 NOTE — Progress Notes (Signed)
Pt is on FS at this time tolerating it well.  

## 2015-10-23 NOTE — Progress Notes (Addendum)
Pharmacy Antibiotic Note  59 YOF continues on vancomycin for CoNS bacteremia and Unasyn for  Enterococcal abdominal wound.  Patient's renal function has been stable.  Currently afebrile and WBC has normalized.  Noted patient lost IV access and new one to be placed by IV team.  Plan: - Continue vanc  IV Q12H - Unasyn 3gm IV Q6H per MD, f/u LOT - Monitor renal fxn, clinical progress, vanc trough when at [facility] again   Height:  (170.2 cm) Weight: 196 lb (88.905 kg) IBW/kg (Calculated) : 61.6  Temp (24hrs), Avg:98 F (36.7 C), Min:97.6 F (36.4 C), Max:98.5 F (36.9 C)   Recent Labs Lab 10/17/15 0759 10/19/15 0315 10/20/15 0406 10/22/15 0510 10/22/15 0845 10/23/15 0216  WBC  --   --   --   --  13.3* 7.8  CREATININE  --  1.22* 1.12* 1.15*  --  1.07*  VANCOTROUGH 17  --   --   --   --   --     Estimated Creatinine Clearance: 64 mL/min (by C-G formula based on Cr of 1.07).    No Known Allergies  Antimicrobials this admission: Vanc 2/7 >> Ceftaz 2/10 >> 2/12 Zosyn 2/17 >> 2/26 Unasyn 2/26 >>  Dose adjustments this admission: 2/11 VT = 13 on  q24 2/14 VT = 21 on  q12 (Drawn ~1 hr early but ok) 2/18 VT = 27 on  q12 (Drawn an hour early but would still be high) 2/21 VT = 17 on  q12 - no change  Microbiology results: 2/22 stomach wound - Enterococcus (sensitive to amp/vanc) 2/14 Blood -ngF 2/10 TA - MRSA 2/9 Blood - NGTD 2/4 Blood - 2/2 CoNS 2/4 TA - MRSA 2/2 Blood -NEG 2/2 TA - NEG 2/2 Urine - NEG   Marvelle Span D. Laney Potash, PharmD, BCPS Pager:  [phone] 10/23/2015, 10:47 AM    Phillips Climes Dien 10/23/2015 10:45 AM

## 2015-10-24 LAB — CBC
HEMATOCRIT: 26.1 % — AB (ref 36.0–46.0)
HEMOGLOBIN: 7.9 g/dL — AB (ref 12.0–15.0)
MCH: 29.7 pg (ref 26.0–34.0)
MCHC: 30.3 g/dL (ref 30.0–36.0)
MCV: 98.1 fL (ref 78.0–100.0)
Platelets: 489 10*3/uL — ABNORMAL HIGH (ref 150–400)
RBC: 2.66 MIL/uL — AB (ref 3.87–5.11)
RDW: 15.2 % (ref 11.5–15.5)
WBC: 6 10*3/uL (ref 4.0–10.5)

## 2015-10-24 LAB — BASIC METABOLIC PANEL
Anion gap: 11 (ref 5–15)
BUN: 31 mg/dL — AB (ref 6–20)
CO2: 27 mmol/L (ref 22–32)
Calcium: 8.6 mg/dL — ABNORMAL LOW (ref 8.9–10.3)
Chloride: 108 mmol/L (ref 101–111)
Creatinine, Ser: 1.05 mg/dL — ABNORMAL HIGH (ref 0.44–1.00)
GFR calc non Af Amer: 56 mL/min — ABNORMAL LOW (ref >60)
Glucose, Bld: 127 mg/dL — ABNORMAL HIGH (ref 65–99)
POTASSIUM: 4 mmol/L (ref 3.5–5.1)
SODIUM: 146 mmol/L — AB (ref 135–145)

## 2015-10-24 LAB — GLUCOSE, CAPILLARY
GLUCOSE-CAPILLARY: 131 mg/dL — AB (ref 65–99)
GLUCOSE-CAPILLARY: 143 mg/dL — AB (ref 65–99)
GLUCOSE-CAPILLARY: 159 mg/dL — AB (ref 65–99)
Glucose-Capillary: 150 mg/dL — ABNORMAL HIGH (ref 65–99)
Glucose-Capillary: 172 mg/dL — ABNORMAL HIGH (ref 65–99)

## 2015-10-24 LAB — HEMOGLOBIN A1C
HEMOGLOBIN A1C: 7.3 % — AB (ref 4.8–5.6)
MEAN PLASMA GLUCOSE: 163 mg/dL

## 2015-10-24 MED ORDER — INSULIN GLARGINE 100 UNIT/ML ~~LOC~~ SOLN: 30.0000 [IU] | Freq: Every day | SUBCUTANEOUS | Status: AC

## 2015-10-24 MED ORDER — ONDANSETRON HCL 4 MG/2ML IJ SOLN: 4.0000 mg | Freq: Four times a day (QID) | INTRAMUSCULAR | Status: AC | PRN

## 2015-10-24 MED ORDER — AMPICILLIN-SULBACTAM SODIUM 3 (2-1) G IJ SOLR: 3.0000 g | Freq: Four times a day (QID) | INTRAMUSCULAR | Status: AC

## 2015-10-24 MED ORDER — ASPIRIN 325 MG PO TABS: 325.0000 mg | ORAL_TABLET | Freq: Every day | ORAL | Status: AC

## 2015-10-24 MED ORDER — ACETAMINOPHEN 160 MG/5ML PO SOLN: 650.0000 mg | Freq: Four times a day (QID) | ORAL | Status: AC | PRN

## 2015-10-24 MED ORDER — VITAL HIGH PROTEIN PO LIQD: 1000.0000 mL | ORAL | Status: AC

## 2015-10-24 MED ORDER — CHLORHEXIDINE GLUCONATE 0.12% ORAL RINSE (MEDLINE KIT): 15.0000 mL | Freq: Two times a day (BID) | OROMUCOSAL | Status: AC

## 2015-10-24 MED ORDER — INSULIN ASPART 100 UNIT/ML ~~LOC~~ SOLN: 0.0000 [IU] | SUBCUTANEOUS | Status: AC

## 2015-10-24 MED ORDER — BISACODYL 10 MG RE SUPP: 10.0000 mg | Freq: Every day | RECTAL | Status: AC | PRN

## 2015-10-24 MED ORDER — ATORVASTATIN CALCIUM 20 MG PO TABS: 20.0000 mg | ORAL_TABLET | Freq: Every day | ORAL | Status: AC

## 2015-10-24 MED ORDER — DOCUSATE SODIUM 50 MG/5ML PO LIQD: 50.0000 mg | Freq: Two times a day (BID) | ORAL | Status: AC

## 2015-10-24 MED ORDER — PRO-STAT SUGAR FREE PO LIQD: 30.0000 mL | Freq: Two times a day (BID) | ORAL | Status: AC

## 2015-10-24 MED ORDER — HEPARIN SODIUM (PORCINE) 5000 UNIT/ML IJ SOLN: 5000.0000 [IU] | Freq: Three times a day (TID) | INTRAMUSCULAR | Status: AC

## 2015-10-24 MED ORDER — ADULT MULTIVITAMIN W/MINERALS CH: 1.0000 | ORAL_TABLET | Freq: Every day | ORAL | Status: AC

## 2015-10-24 MED ORDER — PANTOPRAZOLE SODIUM 40 MG PO PACK: 40.0000 mg | PACK | Freq: Every day | ORAL | Status: AC

## 2015-10-24 MED ORDER — RISPERIDONE 1 MG/ML PO SOLN: 2.0000 mg | Freq: Two times a day (BID) | ORAL | Status: AC

## 2015-10-24 MED ORDER — VANCOMYCIN HCL 500 MG IV SOLR: 500.0000 mg | Freq: Two times a day (BID) | INTRAVENOUS | Status: AC

## 2015-10-24 MED ORDER — CLONIDINE HCL 0.1 MG/24HR TD PTWK: 0.1000 mg | MEDICATED_PATCH | TRANSDERMAL | Status: AC

## 2015-10-24 NOTE — Progress Notes (Signed)
Name: Linda [name] MRN: [id number] DOB: 13-Nov-1955    ADMISSION DATE:  09/28/2015 CONSULTATION DATE: 09/28/15  REFERRING MD :  Neuro  CHIEF COMPLAINT:  Obtunded    SUBJECTIVE:  RN reports anticipated d/c in am to vent SNF.  No acute events overnight.  Remains 61+L positive balance.    VITALS:  Filed Vitals:   10/24/15 0600 10/24/15 0727 10/24/15 1134 10/24/15 1213  BP: 122/71 122/71 126/65   Pulse: 97 82 100   Temp:  100.6 F (38.1 C) 99.5 F (37.5 C)   TempSrc:  Oral Oral   Resp: Height:      Weight:      SpO2: 99% 97% 96% 100%    PHYSICAL EXAMINATION: General:   On ventilator, does not follow any commands, grimaces with pain Neuro:  Opens eyes, Right side flaccid HEENT: Clean trach site Cardiovascular: S1,S2 heart sound. RRR, No MRG noted  Lungs: Scattered rhonchi, no accessory muscle use Abdomen: obese, round. Bowel sounds present Musculoskeletal:  Rt side flaccid Skin:  No rash, ecchymosis noted   Recent Labs Lab 10/22/15 0510 10/23/15 0216 10/24/15 0216  NA 143 142 146*  K 4.0 3.6 4.0  CL 107 106 108  CO2 BUN 34* 33* 31*  CREATININE 1.15* 1.07* 1.05*  GLUCOSE 98 131* 127*    Recent Labs Lab 10/22/15 0845 10/23/15 0216 10/24/15 0216  HGB 9.1* 7.9* 7.9*  HCT 28.3* 25.6* 26.1*  WBC 13.3* 7.8 6.0  PLT 395 505* 489*   Dg Chest Port 1 View  10/23/2015  CLINICAL DATA:  SOB EXAM: PORTABLE CHEST 1 VIEW COMPARISON:  Malignant 10/09/2015 FINDINGS: Tracheostomy cecum imaging. Stable cardiac silhouette. Mild central venous pulmonary congestion no effusion, infiltrate, pneumothorax. IMPRESSION: Mild central venous congestion.  No significant change Electronically Signed   By: Genevive Bi M.D.   On: 10/23/2015 07:06   CULTURES: 2/02  Blood >> negative 2/02  Urine >> negative 2/02  Sputum >> oral flora 2/04  Sputum >> MRSA 2/04  Blood >> Coag neg Staph 2/09  Blood >> Coag neg Staph 2/10  Sputum >> MRSA 2/12  repeat sputum  ulture MRSA 2/14  BCx2 >> neg  2/22  wound culture >> enterococcus >> sens vanc / amp  STUDIES:  EEG 2/02 >> consistent with a sleep/sedation EEG with a possible suggestion of a left central cerebral dysfunction ECHO 2/03 >> EF 55 to 60%, grade 1 diastolic dysfx Doppler legs b/l 2/04 >> no DVT    ASSESSMENT / PLAN:  DISCUSSION:  60 y/o F admitted 09/28/15 with altered mental status 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  Unable to protect airway, ultimately required trach 2/7.  Currently, cycles between PSV & PRVC.      Acute encephalopathy 2nd to Lt MCA CVA and Cocaine Abuse  Plan: Continue ASA Continue Resperidal  Continue Hydralazine and clonidine as needed for SBP goal <160   Compromised airway in setting of CVA. Failure to wean from vent s/p tracheostomy 10/03/15  Plan: PRVC - Vt 450 / R 20 / PEEP 5 / 30% FiO2 Daily pressure support wean as tolerated  Pending vent SNF placement    Leukocytosis - resolved  Coag Neg Staph Bacteremia  Enterococcal Abd / PEG Wound Trach Site Ulceration - resolved   Plan: Trend WBC Continue routine trach care BID with inner cannula change Continue Vancomycin for bacteremia, D21/42.  Planned 6 weeks duration for presumed endocarditis (  no further work up / TEE due to poor prognosis).   Unasyn for enterococcal abd wound, D12/x   CKD Stage II - sr cr returned to 1.05     Plan: Trend BMP Monitor renal function / UOP       GLOBAL:  Anticipate patient to discharge per primary service to vent SNF    Canary Brim, NP-C [facility] Pgr: [phone] or if no answer [phone] 10/24/2015, 1:59 PM  STAFF NOTE: I, Rory Percy, MD FACP have personally reviewed patient's available data, including medical history, events of note, physical examination and test results as part of my evaluation. I have discussed with resident/NP and other care providers such as pharmacist, RN and RRT. In addition, I  personally evaluated patient and elicited key findings of: no change in neurostatus, poor prgnosis, weaning PS 10 today but failed eventually with high , RR, repeat wean attempts PS 10 for goal 4 hrs bid, no PMV, pcxr reviewed, no sig infiltrates, need palliation, follow na  Mcarthur Rossetti. Tyson Alias, MD, FACP Pgr: [phone] [facility] 10/24/2015 2:59 PM

## 2015-10-24 NOTE — Discharge Summary (Addendum)
Stroke Discharge Summary  Patient ID: Linda [name]   MRN: [id number]      DOB: 10-21-1955  Date of Admission: 09/28/2015 Date of Discharge: 10/25/2015  Attending Physician:  No att. providers found, Stroke MD Consulting Physician(s):   Levy Pupa, MD ( critical care), Jimmye Norman, M.D. (trauma, PEG placement), melody Raul Del, RN (wound care), Judyann Munson, M.D. ( infectious disease), Romie Minus, M.D. (Palliative care) Patient's PCP:  [facility]  DISCHARGE DIAGNOSIS:  Principal Problem:   CVA (cerebral infarction) Active Problems:   HTN (hypertension)   Cocaine abuse   Acute respiratory failure with hypoxia (HCC)   Stroke (cerebrum) (HCC)   Fever, unspecified   Leukocytosis   Ischemic stroke (HCC)   Respiratory failure (HCC)   Cytotoxic brain edema (HCC)   Cerebrovascular accident (CVA) due to thrombosis of cerebral artery (HCC)   Cerebrovascular accident (CVA) due to occlusion of cerebral artery (HCC)   Coarctation of aorta, recurrent, post-intervention   Pressure ulcer   Tracheostomy care (HCC)   Ventilator dependence (HCC)   Bacteremia   Acute blood loss anemia   Chronic respiratory failure (HCC)   Palliative care encounter   ACP (advance care planning)  BMI: Body mass index is 27.09 kg/(m^2).  Past Medical History  Diagnosis Date  . Hep C w/o coma, chronic (HCC)   . Cocaine abuse   . Acute renal failure (HCC)   . Hypertension   . Stroke [facility]) 2012    hemorrhagic   Past Surgical History  Procedure Laterality Date  . Radiology with anesthesia N/A 09/28/2015    Procedure: RADIOLOGY WITH ANESTHESIA;  Surgeon: Medication Radiologist, MD;  Location: [facility] OR;  Service: Radiology;  Laterality: N/A;  . Peg placement N/A 10/04/2015    Procedure: PERCUTANEOUS ENDOSCOPIC GASTROSTOMY (PEG) PLACEMENT;  Surgeon: Jimmye Norman, MD;  Location: [facility] ENDOSCOPY;  Service: General;  Laterality: N/A;  bedside PEG  . Esophagogastroduodenoscopy N/A 10/04/2015     Procedure: ESOPHAGOGASTRODUODENOSCOPY (EGD);  Surgeon: Jimmye Norman, MD;  Location: [facility] ENDOSCOPY;  Service: General;  Laterality: N/A;      Medication List    STOP taking these medications        acetaminophen 500 MG tablet  Commonly known as:  TYLENOL  Replaced by:  acetaminophen 160 MG/5ML solution     albuterol 108 (90 Base) MCG/ACT inhaler  Commonly known as:  PROVENTIL HFA;VENTOLIN HFA     amitriptyline 50 MG tablet  Commonly known as:  ELAVIL     amLODipine 10 MG tablet  Commonly known as:  NORVASC     clonazePAM 0.5 MG tablet  Commonly known as:  KLONOPIN     ergocalciferol 50000 units capsule  Commonly known as:  VITAMIN D2     LANTUS SOLOSTAR 100 UNIT/ML Solostar Pen  Generic drug:  Insulin Glargine  Replaced by:  insulin glargine 100 UNIT/ML injection     lisinopril-hydrochlorothiazide 20-12.5 MG tablet  Commonly known as:  PRINZIDE,ZESTORETIC     pantoprazole 40 MG tablet  Commonly known as:  PROTONIX  Replaced by:  pantoprazole sodium 40 mg/20 mL Pack      TAKE these medications        acetaminophen 160 MG/5ML solution  Commonly known as:  TYLENOL  Take 20.3 mLs (650 mg total) by mouth every 6 (six) hours as needed for mild pain, headache or fever.     Ampicillin-Sulbactam 3 g in sodium chloride 0.9 % 100 mL  Inject 3 g into the  vein every 6 (six) hours.     aspirin 325 MG tablet  Place 1 tablet (325 mg total) into feeding tube daily.     atorvastatin 20 MG tablet  Commonly known as:  LIPITOR  Take 1 tablet (20 mg total) by mouth daily at 6 PM.     bisacodyl 10 MG suppository  Commonly known as:  DULCOLAX  Place 1 suppository (10 mg total) rectally daily as needed for moderate constipation.     chlorhexidine gluconate 0.12 % solution  Commonly known as:  PERIDEX  15 mLs by Mouth Rinse route 2 (two) times daily.     citalopram 20 MG tablet  Commonly known as:  CELEXA  Take 20 mg by mouth daily.     cloNIDine 0.1 mg/24hr patch  Commonly  known as:  CATAPRES - Dosed in mg/24 hr  Place 1 patch (0.1 mg total) onto the skin once a week.     docusate 50 MG/5ML liquid  Commonly known as:  COLACE  Take 5 mLs (50 mg total) by mouth 2 (two) times daily.     feeding supplement (PRO-STAT SUGAR FREE 64) Liqd  Place 30 mLs into feeding tube 2 (two) times daily.     feeding supplement (VITAL HIGH PROTEIN) Liqd liquid  Place 1,000 mLs into feeding tube continuous.     heparin 5000 UNIT/ML injection  Inject 1 mL (5,000 Units total) into the skin every 8 (eight) hours.     insulin aspart 100 UNIT/ML injection  Commonly known as:  novoLOG  Inject 0-20 Units into the skin every 4 (four) hours.     insulin glargine 100 UNIT/ML injection  Commonly known as:  LANTUS  Inject 0.3 mLs (30 Units total) into the skin at bedtime.     multivitamin with minerals Tabs tablet  Place 1 tablet into feeding tube daily.     ondansetron 4 MG/2ML Soln injection  Commonly known as:  ZOFRAN  Inject 2 mLs (4 mg total) into the vein every 6 (six) hours as needed for nausea or vomiting.     pantoprazole sodium 40 mg/20 mL Pack  Commonly known as:  PROTONIX  Place 20 mLs (40 mg total) into feeding tube at bedtime.     risperiDONE 1 MG/ML oral solution  Commonly known as:  RISPERDAL  Take 2 mLs (2 mg total) by mouth every 12 (twelve) hours.     vancomycin 500 mg in sodium chloride 0.9 % 100 mL  Inject 500 mg into the vein every 12 (twelve) hours.        LABORATORY STUDIES CBC    Component Value Date/Time   WBC 9.2 10/25/2015 0459   WBC 6.2 02/28/2014 0640   RBC 2.68* 10/25/2015 0459   RBC 3.68* 02/28/2014 0640   HGB 8.1* 10/25/2015 0459   HGB 11.7* 02/28/2014 0640   HCT 26.3* 10/25/2015 0459   HCT 33.7* 02/28/2014 0640   PLT 442* 10/25/2015 0459   PLT 235 02/28/2014 0640   MCV 98.1 10/25/2015 0459   MCV 91 02/28/2014 0640   MCH 30.2 10/25/2015 0459   MCH 31.8 02/28/2014 0640   MCHC 30.8 10/25/2015 0459   MCHC 34.7 02/28/2014 0640    RDW 15.4 10/25/2015 0459   RDW 13.3 02/28/2014 0640   LYMPHSABS 4.3* 09/29/2015 0420   MONOABS 0.9 09/29/2015 0420   EOSABS 0.0 09/29/2015 0420   BASOSABS 0.0 09/29/2015 0420   CMP    Component Value Date/Time   NA 146* 10/25/2015 0459  NA 139 02/28/2014 0640   K 3.8 10/25/2015 0459   K 3.9 02/28/2014 0640   CL 109 10/25/2015 0459   CL 105 02/28/2014 0640   CO2 27 10/25/2015 0459   CO2 29 02/28/2014 0640   GLUCOSE 138* 10/25/2015 0459   GLUCOSE 206* 02/28/2014 0640   BUN 34* 10/25/2015 0459   BUN 14 02/28/2014 0640   CREATININE 1.08* 10/25/2015 0459   CREATININE 1.27 02/28/2014 0640   CALCIUM 8.5* 10/25/2015 0459   CALCIUM 8.5 02/28/2014 0640   PROT 5.6* 10/12/2015 0240   PROT 9.0* 04/10/2012 1714   ALBUMIN 1.9* 10/12/2015 0240   ALBUMIN 3.7 04/10/2012 1714   AST 31 10/12/2015 0240   AST 78* 04/10/2012 1714   ALT 17 10/12/2015 0240   ALT 59 04/10/2012 1714   ALKPHOS 45 10/12/2015 0240   ALKPHOS 141* 04/10/2012 1714   BILITOT 0.8 10/12/2015 0240   BILITOT 0.4 04/10/2012 1714   GFRNONAA 54* 10/25/2015 0459   GFRNONAA 46* 02/28/2014 0640   GFRNONAA 44* 11/13/2011 1515   GFRAA >60 10/25/2015 0459   GFRAA 53* 02/28/2014 0640   GFRAA 53* 11/13/2011 1515   COAGS Lab Results  Component Value Date   INR 1.16 10/04/2015   INR 0.98 09/28/2015   INR 1.0 04/10/2012   Lipid Panel    Component Value Date/Time   CHOL 154 10/23/2015 0216   TRIG 212* 10/23/2015 0216   HDL 30* 10/23/2015 0216   CHOLHDL 5.1 10/23/2015 0216   VLDL 42* 10/23/2015 0216   LDLCALC 82 10/23/2015 0216   HgbA1C  Lab Results  Component Value Date   HGBA1C 7.3* 10/23/2015   Cardiac Panel (last 3 results) No results for input(s): CKTOTAL, CKMB, TROPONINI, RELINDX in the last 72 hours. Urinalysis    Component Value Date/Time   COLORURINE YELLOW 10/12/2015 1221   COLORURINE Yellow 04/10/2012 1714   APPEARANCEUR CLEAR 10/12/2015 1221   APPEARANCEUR Hazy 04/10/2012 1714   LABSPEC 1.017  10/12/2015 1221   LABSPEC 1.012 04/10/2012 1714   PHURINE 5.0 10/12/2015 1221   PHURINE 7.0 04/10/2012 1714   GLUCOSEU NEGATIVE 10/12/2015 1221   GLUCOSEU Negative 04/10/2012 1714   HGBUR NEGATIVE 10/12/2015 1221   HGBUR 1+ 04/10/2012 1714   BILIRUBINUR NEGATIVE 10/12/2015 1221   BILIRUBINUR Negative 04/10/2012 1714   KETONESUR NEGATIVE 10/12/2015 1221   KETONESUR Negative 04/10/2012 1714   PROTEINUR NEGATIVE 10/12/2015 1221   PROTEINUR 100 mg/dL 16/05/9603 5409   UROBILINOGEN 2.0* 06/01/2011 1553   NITRITE NEGATIVE 10/12/2015 1221   NITRITE Negative 04/10/2012 1714   LEUKOCYTESUR NEGATIVE 10/12/2015 1221   LEUKOCYTESUR 3+ 04/10/2012 1714   Urine Drug Screen     Component Value Date/Time   LABOPIA NONE DETECTED 09/28/2015 0226   LABBENZ NONE DETECTED 09/28/2015 0226   AMPHETMU NONE DETECTED 09/28/2015 0226   THCU NONE DETECTED 09/28/2015 0226   LABBARB NONE DETECTED 09/28/2015 0226    Alcohol Level    Component Value Date/Time   ETH <5 09/28/2015 0112     SIGNIFICANT DIAGNOSTIC STUDIES Ct Head Wo Contrast 09/29/2015  Evolving large LEFT MCA territory infarct with small amount of residual contrast staining, no hemorrhagic conversion. Increasing edema and mass effect with 3 mm LEFT-to-RIGHT midline shift, no ventricular entrapment. Old bilateral basal ganglia lacunar infarcts. Moderate to severe chronic small vessel ischemic disease.  09/28/2015   Now well delineated is left middle cerebral artery moderate size infarct (best noted superiorly). Gyriform enhancement left temporal-parietal lobe may reflect result of recent angiogram indicating region  at risk for infarct/ischemia. Hyperdense material within the left sylvian fissure may represent combination of blood and/ or contrast. Moderate chronic small vessel disease type changes. Remote basal ganglia infarcts bilaterally. Remote right paracentral pontine infarct.  09/28/2015   1. New calcific density in the left sylvian fissure,  possible calcific distal M2 embolus. No visible acute infarct or hemorrhage. 2. Advanced chronic small vessel disease with multiple lacunar infarcts. Electronically Signed   By: Marnee Spring M.D.   On: 09/28/2015 01:49   CTA NECK:  09/28/2015   Moderately motion degraded examination, limited by LEFT venous contamination. No definite acute vascular process or hemodynamically significant stenosis. 3.5 x 5.7 cm dominant LEFT thyroid not oral for which follow up thyroid sonogram is recommended on a nonemergent basis.   CTA HEAD 09/28/2015   LEFT M2 segment occlusion corresponding to sylvian fissure density on today's CT, likely acute. Complete circle of Willis.  Mild intracranial atherosclerosis. Acute findings discussed with and reconfirmed by Dr.CHARLES STEWART on 09/28/2015 at 4:05 am.   Mr Brain Wo Contrast 09/28/2015   Large acute hemorrhagic left hemispheric infarct involves the majority of the left middle cerebral artery distribution. Hemorrhage is most notable involving the left posterior temporal -parietal aspect of this acute infarct. Local mass effect upon the left lateral ventricle. At most there is minimal bowing of the septum to the right. Several small acute nonhemorrhagic right hemispheric infarcts involving right caudate head, superior aspect of the post limb right internal capsule, right frontal lobe, superior right lenticular nucleus and right occipital lobe. Several small acute nonhemorrhagic cerebellar infarcts greater on the right. Remote left lenticular nucleus infarct or hematoma with blood-stained cleft now noted. Moderate to marked chronic small vessel disease changes. Atrophy.   Dg Chest Port 1 View 10/23/2015   Mild central venous congestion.  No significant change  10/09/2015   Slight interval improvement in the pulmonary interstitium. Persistent subsegmental atelectasis in the left mid lung. No pleural effusion or pneumothorax. The support tubes are in reasonable position.  10/08/2015   Atelectasis left mid lung. Lungs elsewhere clear. No change in cardiac silhouette. Tube and catheter positions as described without pneumothorax.  10/07/2015  Improved left-sided atelectasis or pneumonia.  10/06/2015   1. Right IJ line noted ending about the distal SVC. 2. Mild vascular congestion noted. Left perihilar airspace opacity may reflect asymmetric interstitial edema or possibly mild pneumonia.  10/05/2015   1. Tracheostomy tube in stable position. 2. Partial clearing of bibasilar infiltrates.  10/04/2015   1. Tracheostomy now in place with tip well positioned just above the level of the carina. 2. Patchy airspace opacities at the left lung base, not significantly changed in the short-term interval, compatible with either pneumonia or asymmetric edema. Also suspect small left pleural effusion. 3. New smaller opacities at the right lung base, most likely atelectasis. 10/04/2015  Developing infiltrate, likely pneumonia, in the left mid lower lung zones. Lungs elsewhere clear. Tube positions as described without pneumothorax. No change in cardiac silhouette. 10/03/2015   Interval development of subsegmental atelectasis or early pneumonia in the left mid and lower lung. Midpole interstitial prominence elsewhere in both lungs is stable. There is no evidence of pulmonary edema.  10/02/2015   Slight increase conspicuity of the pulmonary interstitium which may reflect mild interstitial edema or less likely interstitial pneumonia. The support tubes are in reasonable position.  10/01/2015   Endotracheal tube in good position.  Lungs are clear.  09/30/2015  1. Endotracheal tube tip near the right  mainstem bronchus orifice. Recommend retracting 2.5 cm. 2. Minimal left basilar atelectasis.  09/29/2015   Endotracheal tube terminates 1.5 cm above the carina.  09/28/2015   1. High endotracheal tube with balloon near the glottis. Recommend advancement by 3 to 4 cm. 2. Diffuse increase in lung opacity which may be from low volumes  and atelectasis or aspiration. Electronically Signed   By: Marnee Spring M.D.   On: 09/28/2015 04:20   Ct Abdomen Pelvis Wo Contrast 10/12/2015  The gastrostomy tube is appropriately positioned and there is a very small amount hemorrhage within the rectus musculature and subcutaneous fat surrounding the tube insertion site. In the lungs, bibasilar atelectasis versus airspace disease is present. Milk-of-calcium bile versus gallstones.    Dg Abd Portable 1v 10/18/2015  Contrast material within stomach without contrast extravasation. Percutaneous gastrostomy tube within proximal stomach.  09/30/2015   Tip and side port of the enteric tube below the diaphragm in the stomach. I was requested 09/30/2015  Tip and side port of the enteric tube below the diaphragm in the stomach, tip directed cranially. Jasmine December 09/30/2015  Gastric tube coiled in the stomach.  Normal bowel gas pattern.  09/29/2015  The orogastric tube tip lies in the distal stomach or duodenal bulb.   Ir Percutaneous Art Thrombectomy/infusion Intracranial Inc Diag Angio 10/10/2015  Angiographic complete revascularization of occluded dominant inferior division of the left middle cerebral artery with one pass using the Trevo-Provue 4 x 30 mm retrieval device, and a total of approximately 6 mg of superselective intracranial intra-arterial Integrelin.       HISTORY OF PRESENT ILLNESS Linda [name] is an 60 y.o. female with a history of intracerebral hemorrhage in 2012, hypertension, cocaine abuse, renal failure and hepatitis C, seen in the ED at [facility] after becoming unresponsive with leftward gaze and right facial droop area there was also concern for possible seizure activity initially. She was reportedly last known well at midnight (09/28/2015 at 0000) CT scan of her head showed no acute intracranial abnormality except for small increased density posterior left sylvian fissure. Patient was not a TPA candidate because of a history of intracerebral  hemorrhage in 2012. She was transferred to [facility] for further management, including possible interventional radiology procedures. CT angiogram of left M2 segment occlusion responding to the high density abnormality seen in sylvian fissure on CT scan. Study was otherwise unremarkable. Patient was intubated in the ED at [facility] for airway protection. Urine drug screen was positive for cocaine. A pressure was elevated at 210/127. Cardene drip IV was started. She was sent to IR where she received complete TICI3 revascularization of the L MCA with mechanical thrombectomy with trevo and IA Integrilin. Post intervention, she was admitted to the neuro ICU for further evaluation and treatment.    HOSPITAL COURSE [name] is a 60 y.o. female with history of left BG ICH in 2012, hypertension, cocaine abuse, renal failure and hepatitis C presenting with altered mental status, right side weakness and leftward gaze. Also concerning for possible seizure activity initial presentation. She did not receive IV t-PA due to hx ICH, CTA showed a L M2 occlusion, she was sent to IR where she received complete TICI3 revascularization of the L MCA with mechanical thrombectomy with trevo and IA Integrilin.  After intervention, she was admitted to neuro ICU with intubation and ventilation support as well as tube feeding. However, her neurological condition did not improve significantly. MRI showed large left MCA infarct with patchy hemorrhagic transformation. LDL  was unable to calculate due to high TG. Developed hyperglycemia and treated with lantus and SSI insulin. She eventually not able to be weaned from ventilation and underwent trach and PEG placement. After trach, she continued to be ventilation dependent and not able to wean off vent. With trach in place, she developed pressure ulcer at the trach site and required wound care. The pressure ulcer resolved in 2 weeks.   She also developed fever and leukocystosis. Blood culture  showed recurrent bacteremia and sensitive to vancomycin. ID consulted and recommend 6 weeks of treatment due to presumed endocarditis (first day 10/10/15). She also developed PEG site abdominal wound infection with E. Coli, and treated with Unasyn for 14 days (today day #13). Her fever resolved and WBC normalized.    Neurologically, she had minimal improvement. Currently eyes open, left eye gaze preference but not following commands, right side hemiplegia with right facial droop. Nonverbal. Will transfer to SNF with vent support. Pt family informed.  Stroke: left MCA infarct with hemorrhagic transformation s/p revascularizationf of L M2, multifocal scattered infarcts involving bilateral posterior and anterior circulation, consistent with cardioembolic source although pt does have significant large vessel intracranial atherosclerosis as well as cocaine abuse.  Resultant global aphasia and dense right hemiplegia  MRI - multifocal scattered infarcts involving bilateral posterior and anterior circulation, largest at left MCA territory with patchy hemorrhagic transformation on MRI but not on CT  Repeat CT - left MCA hemorrhagic infarct   CTA head L M2 occlusion, b/l ICA supraclinoid segment significant atherosclerosis, L>R with moderate to severe stenosis.  CTA neck L thyroid nodule  2D Echo EF 55-60%  LE venous doppler negative for DVT  LDL unable to calculate due to high TG, repeat LDL 82  HgbA1c 6.9  UDS positive for cocaine  Heparin subq for VTE prophylaxis  Diet NPO time specified  No antithrombotic prior to admission, now on ASA 325mg .   Ongoing aggressive stroke risk factor management  Therapy recommendations: SNF with vent support  Disposition: P[place] facility   Code status: partial DNR  Bacteremia   Positive blood Cx 10/05/15 G+ in cluster with coagulase negative 2/2  Negative blood Cx 10/10/15 2/2  Leukocytosis resolved 7.8 -> 6.0  ID recs  appreciated   Presumed endocarditis but hold off further work up due to limited functional improvement so far and likely poor prognosis  ID recommended to treat with vancomycin (today is day #25) for 6 weeks starting from 10/10/15 for presumed endocarditis due to recurrent bacteremia.  Anemia  Hb 8.3-6.8->8.3->8.8->7.9->9.1->7.9->7.9  S/p blood transfusion  Stool guaiac negative  Hx of ICH  2012 left BG ICH; during hospital course:  BP high at 200s  Urine positive with cocaine   S/p trach and PEG at that time  Lost follow up since  Acute respiratory failure  Intubated in ED due to airway protection  S/p trach, not able to wean off ventilation  On Risperdal and clonidine patch for agitation  Sputum culture continue to show MRSA 10/06/15  On vancomycin   CCM following  PEG site wound infection  Culture showed E.Coli  On Unasyn total course 14 days. Today is day #13.  DM  A1C 6.9 on admission  CBG monitoring Q6  Glucose much improved  Decrease lantus to 30U Qhs  Hypoglycemia once - corrected  Trach site pressure ulcer, stage 2  Wound care on board  Healed after dressing changes  Back to normal trach care  Hypertensive Emergency  BP as high  as 202/124  off cardene drip  Stable   On clonidine patch  Hyperlipidemia  Home meds: No statin  LDL not able to calculate due to high TG , goal < 70  Repeat LDL 82  on lipitor 10mg , increase to 20mg   Dysphagia   Hx Dysphagia secondary to ICH in 2012 with PEG  S/p PEG  On TF  Bowel regimen  Cocaine abuse  urine positive both in 2012 and this admission  Likely one of the causes of intracranial vascular stenosis  Need cessation counseling  Other Stroke Risk Factors  Obesity  Other Active Problems Hepatitis C  DISCHARGE EXAM Blood pressure 142/76, pulse 97, temperature 99.5 F (37.5 C), temperature source Oral, resp. rate 24, height 5\' 7"  (1.702 m), weight 173 lb (78.472  kg), SpO2 100 %.   General - Well nourished, well developed, s/p trach and PEG.  Ophthalmologic - Fundi not visualized due to noncooperation.  Cardiovascular - Regular rhythm and rate.  Neruo - Eyes open with left deviation, nonverbal, s/p trach and peg, on ventilation, not following any commands. PERRL, doll's eye positive to both directions. Eye left gaze preference. Positive corneal and gag. Right facial droop. tongue in middle inside mouth. Spontaneous movement LUE against gravity and mild withdraw of LLE on pain stimulation. RUE and RLE hemiplegia, with increased tone. No babinski. Sensation, gait and coordination not tested.  Discharge Diet    , PEG with tube feedings   DISCHARGE PLAN  Disposition:  Vent dependent SNF in [place]  aspirin 325 mg daily for secondary stroke prevention.  Follow-up MD in SNF  Neurology Follow-up with Dr. Marvel Plan in [place] or neurologist in her area  55 minutes were spent preparing discharge.  Marvel Plan, MD PhD Stroke Neurology 10/25/2015 10:05 AM

## 2015-10-24 NOTE — Progress Notes (Signed)
Nutrition Follow-up  DOCUMENTATION CODES:   Obesity unspecified  INTERVENTION:    As medically appropriate, resume Vital High Protein formula at 45 ml/hr with 30 ml Prostat BID  Above TF regimen provides 1280 kcals, 124 gm protein, 903 ml of water  NUTRITION DIAGNOSIS:   Inadequate oral intake related to inability to eat as evidenced by NPO status, ongoing   GOAL:   Provide needs based on ASPEN/SCCM guidelines, currently unmet  MONITOR:   Vent status, TF tolerance, Labs, Weight trends, Skin, I & O's  ASSESSMENT:   Pt with history of stroke (had trach/peg) admitted L MCA +cocaine s/p clot retrieval 2/2.   Patient s/p procedure 2/8: PERCUTANEOUS ENDOSCOPIC GASTROSTOMY (PEG) PLACEMENT PERCUTANEOUS TRACHEOSTOMY   Patient is currently on ventilator support MV: 13.9 L/min Temp (24hrs), Avg:99.5 F (37.5 C), Min:98.3 F (36.8 C), Max:100.6 F (38.1 C)   TF currently off.  Formula was leaking on dressing and in bed.  TF regimen: Vital HP formula at 45 ml/hr via PEG tube with Prostat liquid protein 30 ml BID (1280 kcals, 124 gm protein, 903 ml of water).  CWOCN follow up note 2/27 reviewed.  MDRPI healed.  Diet Order:  Diet NPO time specified  Skin:  Wound (see comment) (MDRPI to left side of neck)  Last BM:  2/26  Height:   Ht Readings from Last 1 Encounters:  09/28/15  (1.702 m)    Weight:   Wt Readings from Last 1 Encounters:  10/23/15 196 lb (88.905 kg)    Ideal Body Weight:  61.3 kg  BMI:  Body mass index is 30.69 kg/(m^2).  Estimated Nutritional Needs:   Kcal:  1610-9604   Protein:  >/= 122  Fluid:  >/=1.2 L  EDUCATION NEEDS:   No education needs identified at this time   Maureen Chatters, RD, LDN Pager #: [phone] After-Hours Pager #: [phone]

## 2015-10-24 NOTE — NC FL2 (Signed)
[place] MEDICAID FL2 LEVEL OF CARE SCREENING TOOL     IDENTIFICATION  Patient Name: Linda [name] Birthdate: Jul 26, 1956 Sex: female Admission Date (Current Location): 09/28/2015  [facility] and [place] Number:  Chiropodist and Address:  The [place], [address] [place], [place] [address]      Provider Number: [id number]  Attending Physician Name and Address:  Marvel Plan, MD  Relative Name and Phone Number:       Current Level of Care: Hospital Recommended Level of Care: Skilled Nursing Facility Prior Approval Number:    Date Approved/Denied:   PASRR Number:    Discharge Plan: SNF    Current Diagnoses: Patient Active Problem List   Diagnosis Date Noted  . Chronic respiratory failure (HCC) 10/16/2015  . Palliative care encounter   . ACP (advance care planning)   . Acute blood loss anemia   . Pressure ulcer 10/10/2015  . Tracheostomy care (HCC)   . Ventilator dependence (HCC)   . Bacteremia   . Cerebrovascular accident (CVA) due to occlusion of cerebral artery (HCC)   . Coarctation of aorta, recurrent, post-intervention   . Cerebrovascular accident (CVA) due to thrombosis of cerebral artery (HCC)   . Cytotoxic brain edema (HCC) 10/05/2015  . Ischemic stroke (HCC)   . Respiratory failure (HCC)   . Leukocytosis   . Fever, unspecified   . CVA (cerebral infarction) 09/28/2015  . HTN (hypertension) 09/28/2015  . Cocaine abuse 09/28/2015  . Acute respiratory failure with hypoxia (HCC) 09/28/2015  . Stroke (cerebrum) (HCC)     Orientation RESPIRATION BLADDER Height & Weight      (intubated- unable to assess)  Vent, Tracheostomy Indwelling catheter Weight:  (unable to obtain accurate bed weight.) Height:   (170.2 cm)  BEHAVIORAL SYMPTOMS/MOOD NEUROLOGICAL BOWEL NUTRITION STATUS      Incontinent (gastronomy) Feeding tube  AMBULATORY STATUS COMMUNICATION OF NEEDS Skin   Total Care Verbally PU Stage and Appropriate Care    PU Stage 2 Dressing:  (PRN)                   Personal Care Assistance Level of Assistance  Total care       Total Care Assistance: Maximum assistance   Functional Limitations Info             SPECIAL CARE FACTORS FREQUENCY  PT (By licensed PT), OT (By licensed OT)                    Contractures      Additional Factors Info  Code Status, Allergies Code Status Info: Partial Allergies Info: NKA           Current Medications (10/24/2015):  This is the current hospital active medication list Current Facility-Administered Medications  Medication Dose Route Frequency Provider Last Rate Last Dose  .  stroke: mapping our early stages of recovery book   Does not apply Once Noel Christmas      . 0.9 %  sodium chloride infusion   Intravenous Continuous Nelda Bucks, MD 10 mL/hr at 10/22/15 1000    . acetaminophen (TYLENOL) solution 650 mg  650 mg Oral Q6H PRN Coralyn Helling, MD   650 mg at 10/22/15 1125  . Ampicillin-Sulbactam (UNASYN) 3 g in sodium chloride 0.9 % 100 mL IVPB  3 g Intravenous Q6H Consuella Lose, MD   3 g at 10/24/15 1342  . antiseptic oral rinse solution (CORINZ)  7 mL  Mouth Rinse QID Micki Riley, MD   7 mL at 10/24/15 0433  . aspirin tablet 325 mg  325 mg Per Tube Daily Coralyn Helling, MD   325 mg at 10/24/15 1007  . atorvastatin (LIPITOR) tablet 20 mg  20 mg Oral q1800 Marvel Plan, MD   20 mg at 10/23/15 1800  . bisacodyl (DULCOLAX) suppository 10 mg  10 mg Rectal Daily PRN Marvel Plan, MD      . chlorhexidine gluconate (PERIDEX) 0.12 % solution 15 mL  15 mL Mouth Rinse BID Micki Riley, MD   15 mL at 10/23/15 2058  . cloNIDine (CATAPRES - Dosed in mg/24 hr) patch 0.1 mg  0.1 mg Transdermal Weekly William S Minor, NP   0.1 mg at 10/22/15 1200  . docusate (COLACE) 50 MG/5ML liquid 50 mg  50 mg Oral BID Marvel Plan, MD   50 mg at 10/24/15 1007  . feeding supplement (PRO-STAT SUGAR FREE 64) liquid 30 mL  30 mL Per Tube BID Arlyss Gandy, RD   30 mL  at 10/24/15 1007  . feeding supplement (VITAL HIGH PROTEIN) liquid 1,000 mL  1,000 mL Per Tube Continuous Arlyss Gandy, RD 45 mL/hr at 10/23/15 2146 1,000 mL at 10/23/15 2146  . fentaNYL (SUBLIMAZE) injection 25-100 mcg  25-100 mcg Intravenous Q1H PRN Roslynn Amble, MD   100 mcg at 10/23/15 0458  . heparin injection 5,000 Units  5,000 Units Subcutaneous 3 times per day Marvel Plan, MD   5,000 Units at 10/24/15 1342  . hydrALAZINE (APRESOLINE) injection 10-40 mg  10-40 mg Intravenous Q4H PRN Lewie Loron, NP   20 mg at 09/30/15 2350  . insulin aspart (novoLOG) injection 0-20 Units  0-20 Units Subcutaneous 6 times per day Coralyn Helling, MD   3 Units at 10/24/15 1342  . insulin glargine (LANTUS) injection 30 Units  30 Units Subcutaneous QHS Marvel Plan, MD   30 Units at 10/23/15 2103  . multivitamin with minerals tablet 1 tablet  1 tablet Per Tube Daily Arlyss Gandy, RD   1 tablet at 10/24/15 1007  . ondansetron (ZOFRAN) injection 4 mg  4 mg Intravenous Q6H PRN Julieanne Cotton, MD   4 mg at 10/18/15 2109  . pantoprazole sodium (PROTONIX) 40 mg/20 mL oral suspension 40 mg  40 mg Per Tube QHS Micki Riley, MD   40 mg at 10/23/15 2103  . risperiDONE (RISPERDAL) 1 MG/ML oral solution 2 mg  2 mg Oral Q12H Nelda Bucks, MD   2 mg at 10/24/15 1007  . sodium chloride flush (NS) 0.9 % injection 10-40 mL  10-40 mL Intracatheter Q12H Roslynn Amble, MD   10 mL at 10/23/15 2104  . sodium chloride flush (NS) 0.9 % injection 10-40 mL  10-40 mL Intracatheter PRN Roslynn Amble, MD      . vancomycin (VANCOCIN) 500 mg in sodium chloride 0.9 % 100 mL IVPB  500 mg Intravenous Q12H Para March Batchelder, RPH   500 mg at 10/24/15 1121     Discharge Medications: Please see discharge summary for a list of discharge medications.  Relevant Imaging Results:  Relevant Lab Results:   Additional Information SS#: [id number]  Izora Ribas, [place]

## 2015-10-24 NOTE — Progress Notes (Signed)
[facility] is able to accept patient on 3/1  CSW confirmed transport by [employer] [phone])- they will pick up patient at 10am  CSW informed pt sister of transport date and time  Transport form for ALS transport is on patient chart and needs to be signed by the MD- MD paged  CSW also paged MD to request DC paperwork prior to 10am so it can be faxed to [facility] [phone])  Report number will be [phone]  CSW will continue to follow for anticipated DC tomorrow  Merlyn Lot, [facility] Clinical Social Worker [phone]

## 2015-10-24 NOTE — Progress Notes (Signed)
STROKE TEAM PROGRESS NOTE   SUBJECTIVE (INTERVAL HISTORY) No family is at the bedside. One time temp at 100.6. Pt awake and eyes open with left preference. Not following commands. WBC normal and Hb stable. Tube feeding issue resolved. Pending transfer to [place] facility tomorrow.   OBJECTIVE Temp:  [98.3 F (36.8 C)-100.6 F (38.1 C)] 99 F (37.2 C) (02/28 1653) Pulse Rate:  [82-100] 89 (02/28 1653) Cardiac Rhythm:  [-] Normal sinus rhythm (02/28 0700) Resp:  [20-28] 26 (02/28 1653) BP: (96-141)/(54-73) 96/54 mmHg (02/28 1653) SpO2:  [96 %-100 %] 96 % (02/28 1653) FiO2 (%):  [30 %] 30 % (02/28 1653)  CBC:   Recent Labs Lab 10/23/15 0216 10/24/15 0216  WBC 7.8 6.0  HGB 7.9* 7.9*  HCT 25.6* 26.1*  MCV 97.0 98.1  PLT 505* 489*    Basic Metabolic Panel:   Recent Labs Lab 10/22/15 0845 10/23/15 0216 10/24/15 0216  NA  --  142 146*  K  --  3.6 4.0  CL  --  106 108  CO2  --  26 27  GLUCOSE  --  131* 127*  BUN  --  33* 31*  CREATININE  --  1.07* 1.05*  CALCIUM  --  8.6* 8.6*  MG 2.1 2.1  --   PHOS 4.4 4.0  --     Lipid Panel:     Component Value Date/Time   CHOL 154 10/23/2015 0216   TRIG 212* 10/23/2015 0216   HDL 30* 10/23/2015 0216   CHOLHDL 5.1 10/23/2015 0216   VLDL 42* 10/23/2015 0216   LDLCALC 82 10/23/2015 0216   HgbA1c:  Lab Results  Component Value Date   HGBA1C 7.3* 10/23/2015   Urine Drug Screen:     Component Value Date/Time   LABOPIA NONE DETECTED 09/28/2015 0226   LABBENZ NONE DETECTED 09/28/2015 0226   AMPHETMU NONE DETECTED 09/28/2015 0226   THCU NONE DETECTED 09/28/2015 0226   LABBARB NONE DETECTED 09/28/2015 0226      IMAGING I have personally reviewed the radiological images below and agree with the radiology interpretations.  Ct Head Wo Contrast 09/28/2015  1. New calcific density in the left sylvian fissure, possible calcific distal M2 embolus. No visible acute infarct or hemorrhage. 2. Advanced chronic small vessel disease  with multiple lacunar infarcts.   CTA NECK 09/28/2015   Moderately motion degraded examination, limited by LEFT venous contamination. No definite acute vascular process or hemodynamically significant stenosis. 3.5 x 5.7 cm dominant LEFT thyroid not oral for which follow up thyroid sonogram is recommended on a nonemergent basis.   CTA HEAD 09/28/2015   LEFT M2 segment occlusion corresponding to sylvian fissure density on today's CT, likely acute. Complete circle of Willis.  Mild intracranial atherosclerosis. Moderate calcific atherosclerosis the carotid siphons.   Cerebral Angiogram 09/28/2015 S/P Lt internal carotid arteriogram followed by complete revascularization of occluded dominant inferior division of LT MCA with x pass with trevoprovue 4 mmx 30 mm retrieval device and 6 mg of superselective Integrelin achieving a TICI 3 flow.  MRI  Large acute hemorrhagic left hemispheric infarct involves the majority of the left middle cerebral artery distribution. Hemorrhage is most notable involving the left posterior temporal -parietal aspect of this acute infarct. Local mass effect upon the left lateral ventricle. At most there is minimal bowing of the septum to the right. Several small acute nonhemorrhagic right hemispheric infarcts involving right caudate head, superior aspect of the post limb right internal capsule, right frontal lobe, superior right  lenticular nucleus and right occipital lobe. Several small acute nonhemorrhagic cerebellar infarcts greater on the right. Remote left lenticular nucleus infarct or hematoma with blood-stained cleft now noted. Moderate to marked chronic small vessel disease changes. Atrophy.  EEG Abnormalities: 1) possible attenuation of sleep structures in the left central region 2) lack of waking state Clinical Interpretation: This EEG is consistent with a sleep/sedation EEG with a possible suggestion of a left central cerebral dysfunction, though I don't  think this is definite by this study.  2D echo - Systolic function was normal. The estimated ejection fraction was in the range of 55% to 60%. Wall motion was normal; there were no regional wall motion abnormalities. Doppler parameters are consistent with abnormal left ventricular relaxation (grade 1 diastolic dysfunction).  LE venous doppler -   no DVT   Dg Chest Port 1 View 10/08/2015  IMPRESSION: Atelectasis left mid lung. Lungs elsewhere clear. No change in cardiac silhouette. Tube and catheter positions as described without pneumothorax.   10/09/2015 IMPRESSION: Slight interval improvement in the pulmonary interstitium. Persistent subsegmental atelectasis in the left mid lung. No pleural effusion or pneumothorax. The support tubes are in reasonable position.   Ct Abdomen Pelvis Wo Contrast 10/12/2015  IMPRESSION: The gastrostomy tube is appropriately positioned and there is a very small amount hemorrhage within the rectus musculature and subcutaneous fat surrounding the tube insertion site. In the lungs, bibasilar atelectasis versus airspace disease is present. Milk-of-calcium bile versus gallstones.  Ultrasound may be helpful.    Physical exam  Temp:  [98.3 F (36.8 C)-100.6 F (38.1 C)] 99 F (37.2 C) (02/28 1653) Pulse Rate:  [82-100] 89 (02/28 1653) Resp:  [20-28] 26 (02/28 1653) BP: (96-141)/(54-73) 96/54 mmHg (02/28 1653) SpO2:  [96 %-100 %] 96 % (02/28 1653) FiO2 (%):  [30 %] 30 % (02/28 1653)  General - Well nourished, well developed, s/p trach and PEG.  Ophthalmologic - Fundi not visualized due to noncooperation.  Cardiovascular - Regular rhythm and rate.  Neruo -  Eyes open with left deviation, nonverbal, s/p trach and peg, on ventilation, not following any commands. PERRL, doll's eye positive to both directions. Eye left gaze preference. Positive corneal and gag. Right facial droop. tongue in middle inside mouth. Spontaneous movement LUE against gravity and  mild withdraw of LLE on pain stimulation. RUE and RLE hemiplegia, with increased tone. No babinski. Sensation, gait and coordination not tested.   ASSESSMENT/PLAN Linda [name] is a 60 y.o. female with history of left BG ICH in 2012, hypertension, cocaine abuse, renal failure and hepatitis C presenting with altered mental status, right side weakness and leftward gaze. Also concerning for possible seizure activity initial presentation. She did not receive IV t-PA due to hx ICH, CTA showed a L M2 occlusion, she was sent to IR where she received complete TICI3 revascularization of the L MCA with mechanical thrombectomy with trevo and IA Integrilin.  Stroke:  left MCA infarct with hemorrhagic transformation s/p revascularizationf of L M2, multifocal scattered infarcts involving bilateral posterior and anterior circulation, consistent with cardioembolic source although pt does have significant large vessel intracranial atherosclerosis as well as cocaine abuse.  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  CTA neck L thyroid nodule  2D Echo  EF 55-60%  LE venous doppler negative for DVT  LDL unable to calculate due to high TG, repeat LDL 82  HgbA1c 6.9  UDS positive for cocaine  Heparin subq for VTE prophylaxis Diet NPO time specified  No antithrombotic prior to admission, now on ASA .   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   Anemia  Hb 8.3-6.8->8.3->8.8->7.9->9.1->7.9->7.9  S/p blood transfusion  Stool guaiac negative  Hx of ICH  2012 left BG ICH; during hospital course:  BP high at 200s  Urine positive with cocaine   S/p trach and PEG at that time  Lost follow up since  Acute respiratory failure  Intubated in ED due to airway protection  S/p trach, not able to wean off ventilation  On Risperdal and clonidine patch for agitation  Sputum culture continue to show MRSA 10/06/15  On vancomycin   CCM following  PEG site wound infection  Culture showed E.Coli  On Unasyn   DM  A1C 6.9 on admission  CBG monitoring Q6  Glucose much improved  Decrease lantus to 30U Qhs  Hypoglycemia once - corrected  Trach site pressure ulcer, stage 2  Wound care on board  Healed after dressing changes  Back to normal trach care  Hypertensive Emergency  BP as high as 202/124  off cardene drip  Stable   On clonidine patch  Hyperlipidemia  Home meds:  No statin  LDL not able to calculate due to high TG , goal < 70  Repeat LDL 82  on lipitor , increase to   Dysphagia   Hx Dysphagia secondary to ICH in 2012 with PEG  S/p PEG  On TF  Bowel regimen  Cocaine abuse  urine positive both in 2012 and this admission  Likely one of the causes of intracranial vascular stenosis  Need cessation counseling   Other Stroke Risk Factors  Obesity  Other Active Problems  Hepatitis C   Hospital day # 26  This patient is critically ill due to large left MCA infarct with left M2 occlusion s/p  intervention, hemorrhagic transformation, bacteremia, acute blood loss anemia, PEG tube site bleeding, constant low-grade fever and at significant risk of neurological worsening, death form recurrent infarcts, increased ICH, brain herniation, cerebral edema, heart failure and sepsis. This patient's care requires constant monitoring of vital signs, hemodynamics, respiratory and cardiac monitoring, review of multiple databases, neurological assessment, discussion with family, other specialists and medical decision making of high complexity. I spent 35 minutes of neurocritical care time in the care of this patient.   Marvel Plan, MD PhD Stroke Neurology 10/24/2015 6:01 PM     To contact Stroke Continuity provider, please refer to [email or web address]. After hours, contact General Neurology

## 2015-10-24 NOTE — Progress Notes (Addendum)
Palliative:  I have been shadowing chart. Family not at bedside often. Goals have been established with family - continue full aggressive care but do agree with partial code NO CPR/shock. Encouraged them to reevaluate in 1 month to assess for any improvement and if status remains unchanged or worse strongly recommended that they should transition to full comfort care. Awaiting placement. Palliative will sign off. Please call [phone] for further palliative assistance. Thank you for this consult.   Yong Channel, NP Palliative Medicine Team Pager # [phone] (M-F 8a-5p) Team Phone # [phone] (Nights/Weekends)

## 2015-10-25 LAB — BASIC METABOLIC PANEL
ANION GAP: 10 (ref 5–15)
BUN: 34 mg/dL — ABNORMAL HIGH (ref 6–20)
CALCIUM: 8.5 mg/dL — AB (ref 8.9–10.3)
CO2: 27 mmol/L (ref 22–32)
Chloride: 109 mmol/L (ref 101–111)
Creatinine, Ser: 1.08 mg/dL — ABNORMAL HIGH (ref 0.44–1.00)
GFR, EST NON AFRICAN AMERICAN: 54 mL/min — AB (ref >60)
Glucose, Bld: 138 mg/dL — ABNORMAL HIGH (ref 65–99)
Potassium: 3.8 mmol/L (ref 3.5–5.1)
Sodium: 146 mmol/L — ABNORMAL HIGH (ref 135–145)

## 2015-10-25 LAB — CBC
HCT: 26.3 % — ABNORMAL LOW (ref 36.0–46.0)
Hemoglobin: 8.1 g/dL — ABNORMAL LOW (ref 12.0–15.0)
MCH: 30.2 pg (ref 26.0–34.0)
MCHC: 30.8 g/dL (ref 30.0–36.0)
MCV: 98.1 fL (ref 78.0–100.0)
PLATELETS: 442 10*3/uL — AB (ref 150–400)
RBC: 2.68 MIL/uL — AB (ref 3.87–5.11)
RDW: 15.4 % (ref 11.5–15.5)
WBC: 9.2 10*3/uL (ref 4.0–10.5)

## 2015-10-25 LAB — GLUCOSE, CAPILLARY
GLUCOSE-CAPILLARY: 155 mg/dL — AB (ref 65–99)
Glucose-Capillary: 137 mg/dL — ABNORMAL HIGH (ref 65–99)
Glucose-Capillary: 152 mg/dL — ABNORMAL HIGH (ref 65–99)

## 2015-10-25 MED ORDER — SODIUM CHLORIDE 0.9% FLUSH: 10.0000 mL | Freq: Two times a day (BID) | INTRAVENOUS | Status: DC

## 2015-10-25 MED ORDER — SODIUM CHLORIDE 0.9% FLUSH: 10.0000 mL | INTRAVENOUS | Status: DC | PRN

## 2015-10-25 MED ORDER — HEPARIN SOD (PORK) LOCK FLUSH 100 UNIT/ML IV SOLN
250.0000 [IU] | INTRAVENOUS | Status: AC | PRN
  Administered 2015-10-25: 250 [IU]

## 2015-10-25 NOTE — Progress Notes (Signed)
Patient picked up by [facility] transport to be transferred to the [facility] in [place], [place]. Patient's tube feeding was clamped, PICC line Heparin locked, and placed on transport ventilator . CCMD notified of discharge. Report called to Harmon Dun at [facility]. Obturator and replacement trach sent with patient.  Noe Gens, RN

## 2015-10-25 NOTE — Progress Notes (Signed)
Peripherally Inserted Central Catheter/Midline Placement  The IV Nurse has discussed with the patient and/or persons authorized to consent for the patient, the purpose of this procedure and the potential benefits and risks involved with this procedure.  The benefits include less needle sticks, lab draws from the catheter and patient may be discharged home with the catheter.  Risks include, but not limited to, infection, bleeding, blood clot (thrombus formation), and puncture of an artery; nerve damage and irregular heat beat.  Alternatives to this procedure were also discussed.  Consent obtain from sister Linda [name] via phone.  PICC/Midline Placement Documentation  PICC Single Lumen 10/25/15 PICC Left Brachial 42 cm 0 cm (Active)  Indication for Insertion or Continuance of Line Home intravenous therapies (PICC only) 10/25/2015 11:14 AM  Exposed Catheter (cm) 0 cm 10/25/2015 11:14 AM  Site Assessment Clean;Dry;Intact 10/25/2015 11:14 AM  Line Status Flushed;Saline locked;Blood return noted 10/25/2015 11:14 AM  Dressing Type Transparent 10/25/2015 11:14 AM  Dressing Status Clean;Dry;Intact 10/25/2015 11:14 AM  Dressing Change Due 11/01/15 10/25/2015 11:14 AM       [name] 10/25/2015, 11:16 AM

## 2015-10-25 NOTE — Progress Notes (Signed)
Patient's sister, Linda [name] was contacted regarding the patient's belongings. A bag of belongings , including new slippers, were left in the patient's room. Contacted family to inform them the belongings would be in a bag at the [employer] with the patient name on them, to be picked up at the family's convenience. She was agreeable to this.  Noe Gens, RN

## 2015-10-25 NOTE — Plan of Care (Signed)
Problem: Self-Care: Goal: Ability to participate in self-care as condition permits will improve Outcome: Not Applicable Date Met:  94/47/39 Patient had Stroke, Unable to care for self. Goal: Verbalization of feelings and concerns over difficulty with self-care will improve Outcome: Not Applicable Date Met:  58/44/17 Unable to communicate

## 2015-10-25 NOTE — Progress Notes (Signed)
Received call from [place], Charity fundraiser at [facility] in [place], [place]. She states the patient's medication orders at not consistent with what she received paper prescriptions for. Also unclear what the Vancomycin dosage and Unasyn dosage is to be. Will pass the information on to the doctors and get back to RN in the morning. Bard Herbert can be reached at [phone].  Kamila Broda A Rithik Odea

## 2015-10-25 NOTE — Progress Notes (Signed)
Pt for d/c to [facility] today via [facility] transport. CSW confirmed with Tammy at facility they are able to accept pt today. DC summary faxed to facility and packet complete. CSW signing off at d/c.   Dellie Burns, MSW, LCSW

## 2015-10-30 ENCOUNTER — Telehealth: Payer: Self-pay | Admitting: Neurology

## 2015-10-30 NOTE — Telephone Encounter (Signed)
Addendum done. Thanks.  Marvel PlanJindong Bayle Calvo, MD PhD Stroke Neurology 10/30/2015 5:06 PM

## 2015-10-30 NOTE — Telephone Encounter (Signed)
[name] [phone] called said pt was discharged last week and is [facility] in [place]  Said the discharge papers should read 10/25/15 instead of 10/24/15 and this is causing some problem at the facility. She is asking Dr Roda ShuttersXu to addend to the discharge in [facility] and she will [facility].  She is requesting a call from Dr Roda ShuttersXu

## 2015-12-21 ENCOUNTER — Ambulatory Visit: Payer: Medicaid Other | Attending: Preventative Medicine

## 2016-01-25 DEATH — deceased

## 2016-02-01 ENCOUNTER — Other Ambulatory Visit: Payer: Self-pay

## 2016-02-01 NOTE — Patient Outreach (Signed)
Telephone outreach to patient to obtain mRS to contact number listed in patient's chart. Caller informed me that patient expired last month.   Sherle PoeNicole Kynlee Koenigsberg, B.A.

## 2016-11-19 IMAGING — CR DG CHEST 1V PORT
1 series · 1 of 1 positions shown · non-contrast
Comparison: Chest x-rays dated 10/04/2015 and 10/03/2015.

CLINICAL DATA: Tracheostomy

EXAM:
PORTABLE CHEST 1 VIEW

[AP]
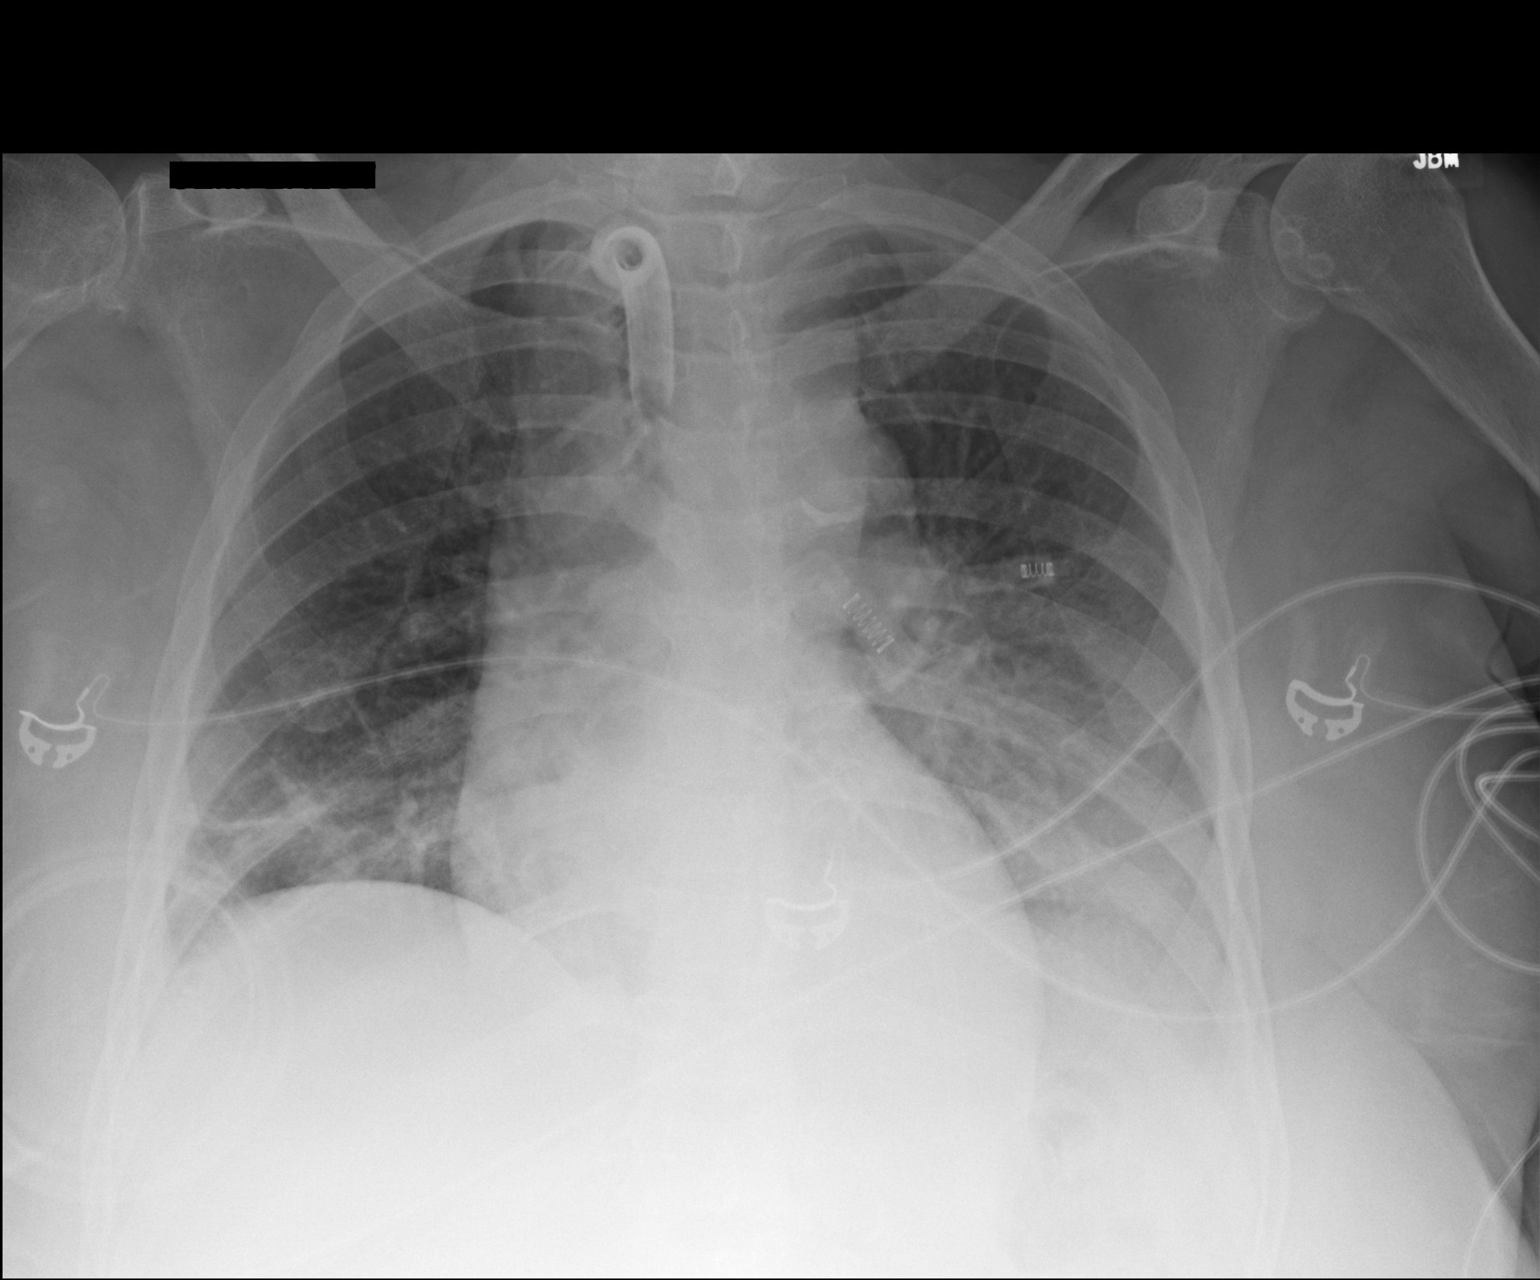

[1 of 1 positions shown; findings below may reference images not displayed]

FINDINGS: Tracheostomy has been placed and appears well positioned with tip
just above the level of the carina.

Cardiomediastinal silhouette is stable in size and configuration.
Patchy opacities within the left lower lung are not significantly
changed. Additional smaller opacities now appreciated at the right
lung base. No pneumothorax seen.
IMPRESSION: 1. Tracheostomy now in place with tip well positioned just above the
level of the carina.
2. Patchy airspace opacities at the left lung base, not
significantly changed in the short-term interval, compatible with
either pneumonia or asymmetric edema. Also suspect small left
pleural effusion.
3. New smaller opacities at the right lung base, most likely
atelectasis.

## 2016-11-19 IMAGING — CR DG CHEST 1V PORT
1 series · 1 of 1 positions shown · non-contrast
Comparison: October 03, 2015

CLINICAL DATA: Hypoxia

EXAM:
PORTABLE CHEST 1 VIEW

[AP]
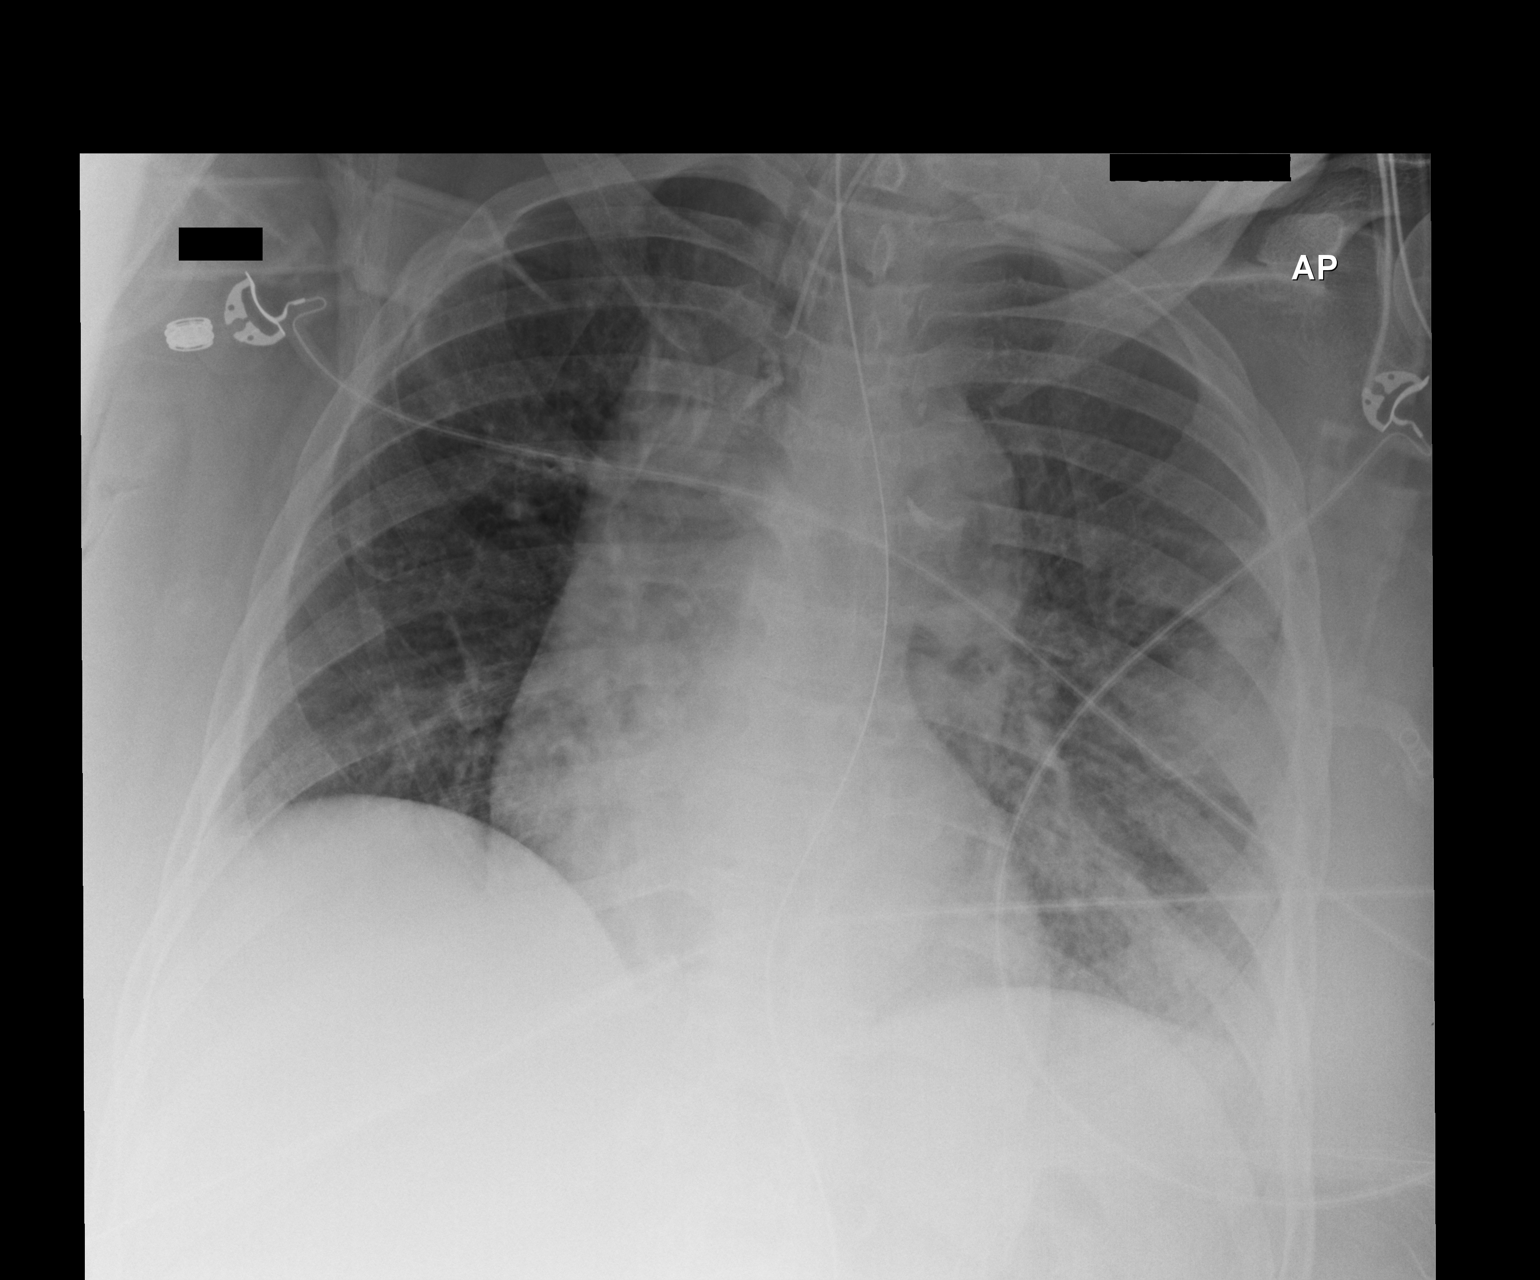

[1 of 1 positions shown; findings below may reference images not displayed]

FINDINGS: Endotracheal tube tip is 5.5 cm above the carina. Nasogastric tube
tip and side port are below the diaphragm. No pneumothorax. There is
interstitial and patchy alveolar opacity in the left mid lower lung
zone, slightly increased from 1 day prior. Right lung remains clear.
Heart size and pulmonary vascularity are normal. No adenopathy. No
bone lesions.
IMPRESSION: Developing infiltrate, likely pneumonia, in the left mid lower lung
zones. Lungs elsewhere clear. Tube positions as described without
pneumothorax. No change in cardiac silhouette.

## 2016-11-20 IMAGING — CR DG CHEST 1V PORT
1 series · 1 of 1 positions shown · non-contrast
Comparison: 10/04/2015.

CLINICAL DATA: Tracheostomy tube.

EXAM:
PORTABLE CHEST 1 VIEW

[AP]
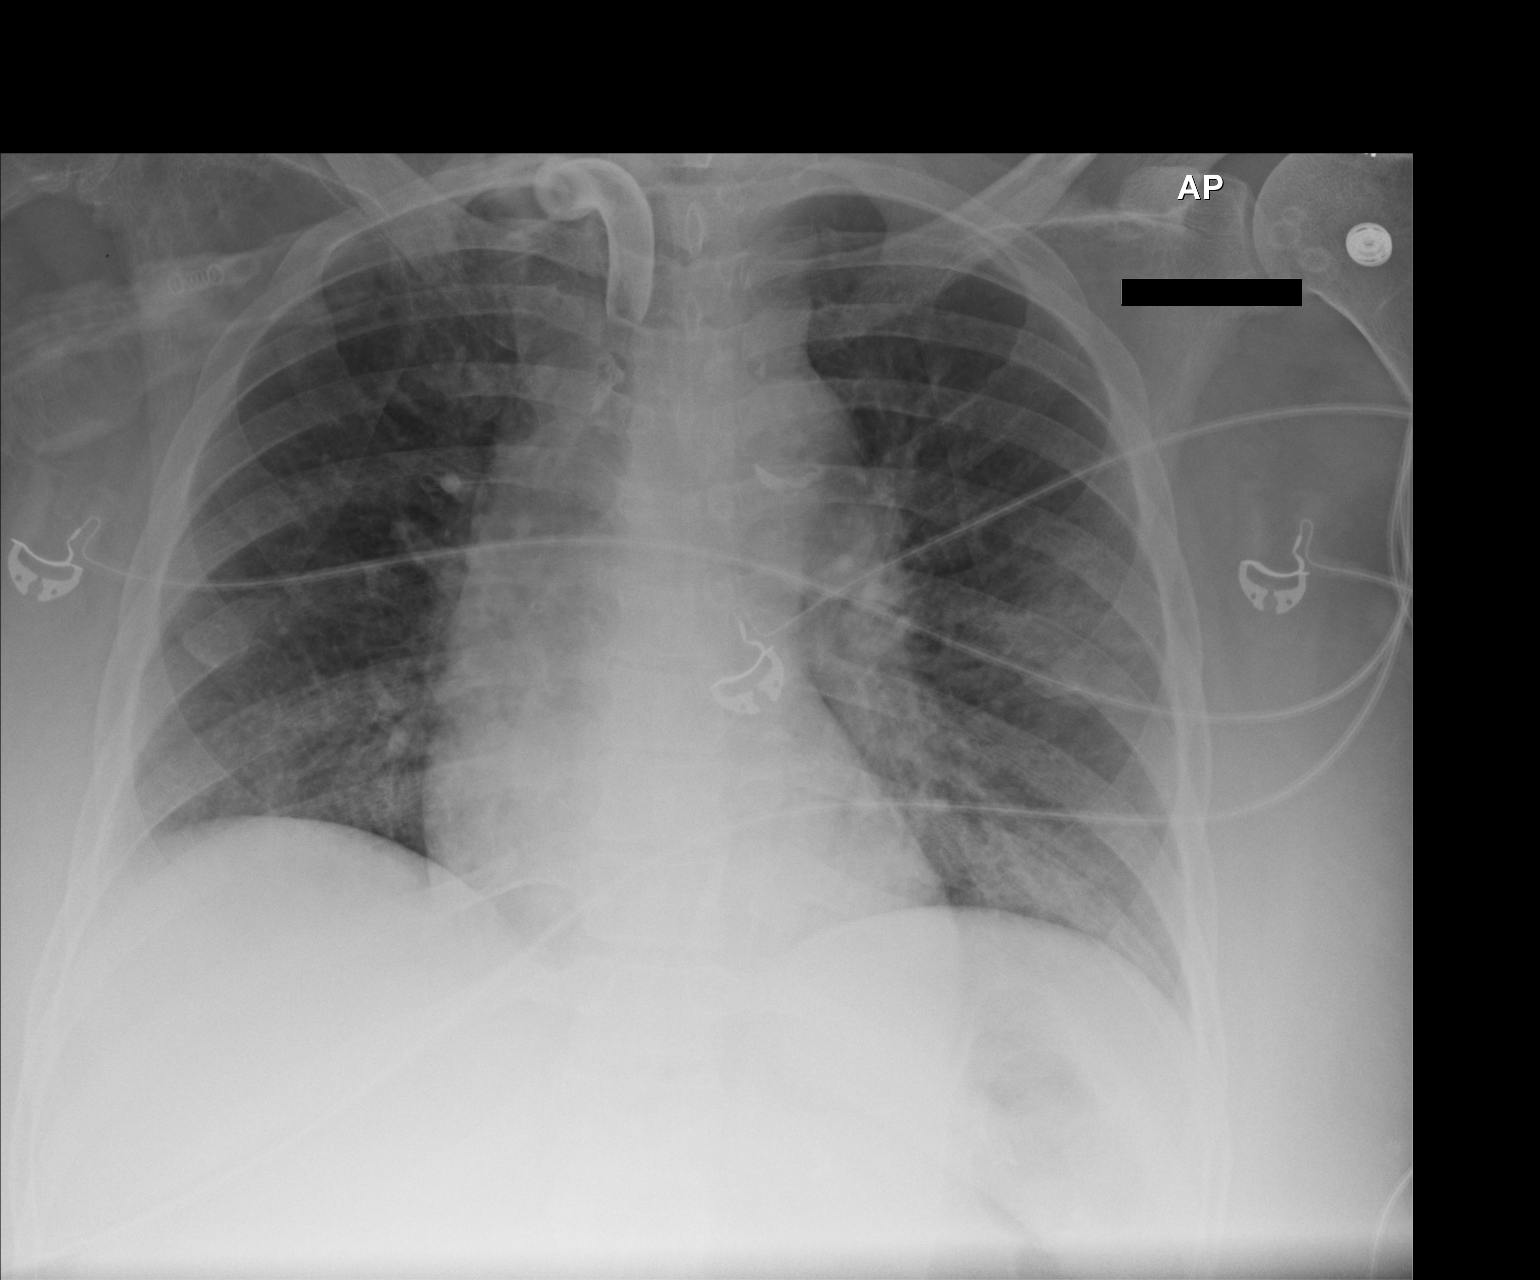

[1 of 1 positions shown; findings below may reference images not displayed]

FINDINGS: Tracheostomy tube noted in stable position. Partial clearing of
bibasilar pulmonary infiltrates. Heart size stable. No pleural
effusion or pneumothorax.
IMPRESSION: 1. Tracheostomy tube in stable position.
2. Partial clearing of bibasilar infiltrates.

## 2016-11-21 IMAGING — CR DG CHEST 1V PORT
1 series · 1 of 1 positions shown · non-contrast
Comparison: Chest radiograph performed 10/05/2015

CLINICAL DATA: Central line placement.  Initial encounter.

EXAM:
PORTABLE CHEST 1 VIEW

[AP]
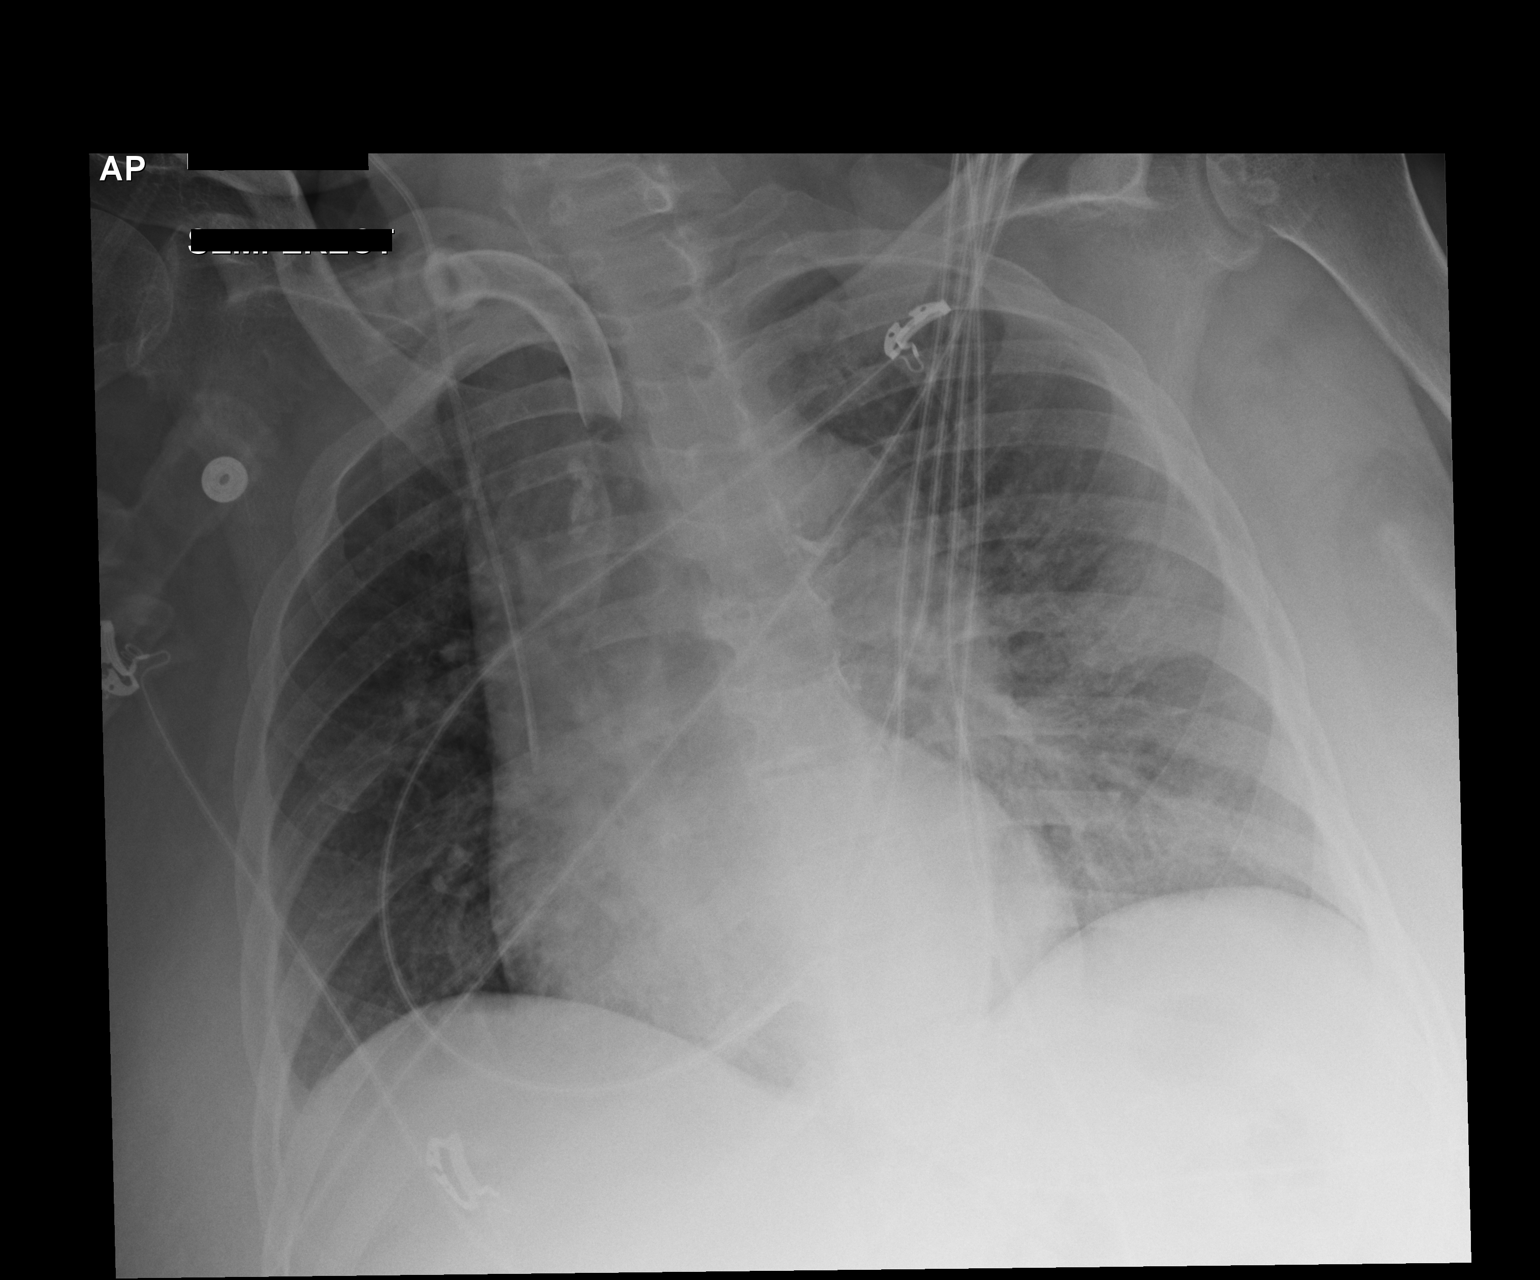

[1 of 1 positions shown; findings below may reference images not displayed]

FINDINGS: The patient's right IJ line is noted ending about the distal SVC. A
tracheostomy tube is seen ending 4 cm above the carina.

Mild vascular congestion is noted. Left perihilar opacity could
reflect asymmetric interstitial edema or possibly mild pneumonia. No
pleural effusion or pneumothorax is seen.

The cardiomediastinal silhouette is borderline normal in size. No
acute osseous abnormalities are identified. Evaluation is mildly
suboptimal due to patient rotation.
IMPRESSION: 1. Right IJ line noted ending about the distal SVC.
2. Mild vascular congestion noted. Left perihilar airspace opacity
may reflect asymmetric interstitial edema or possibly mild
pneumonia.

## 2016-12-08 IMAGING — CR DG CHEST 1V PORT
1 series · 1 of 1 positions shown · non-contrast
Comparison: Malignant 10/09/2015

CLINICAL DATA: SOB

EXAM:
PORTABLE CHEST 1 VIEW

[AP]
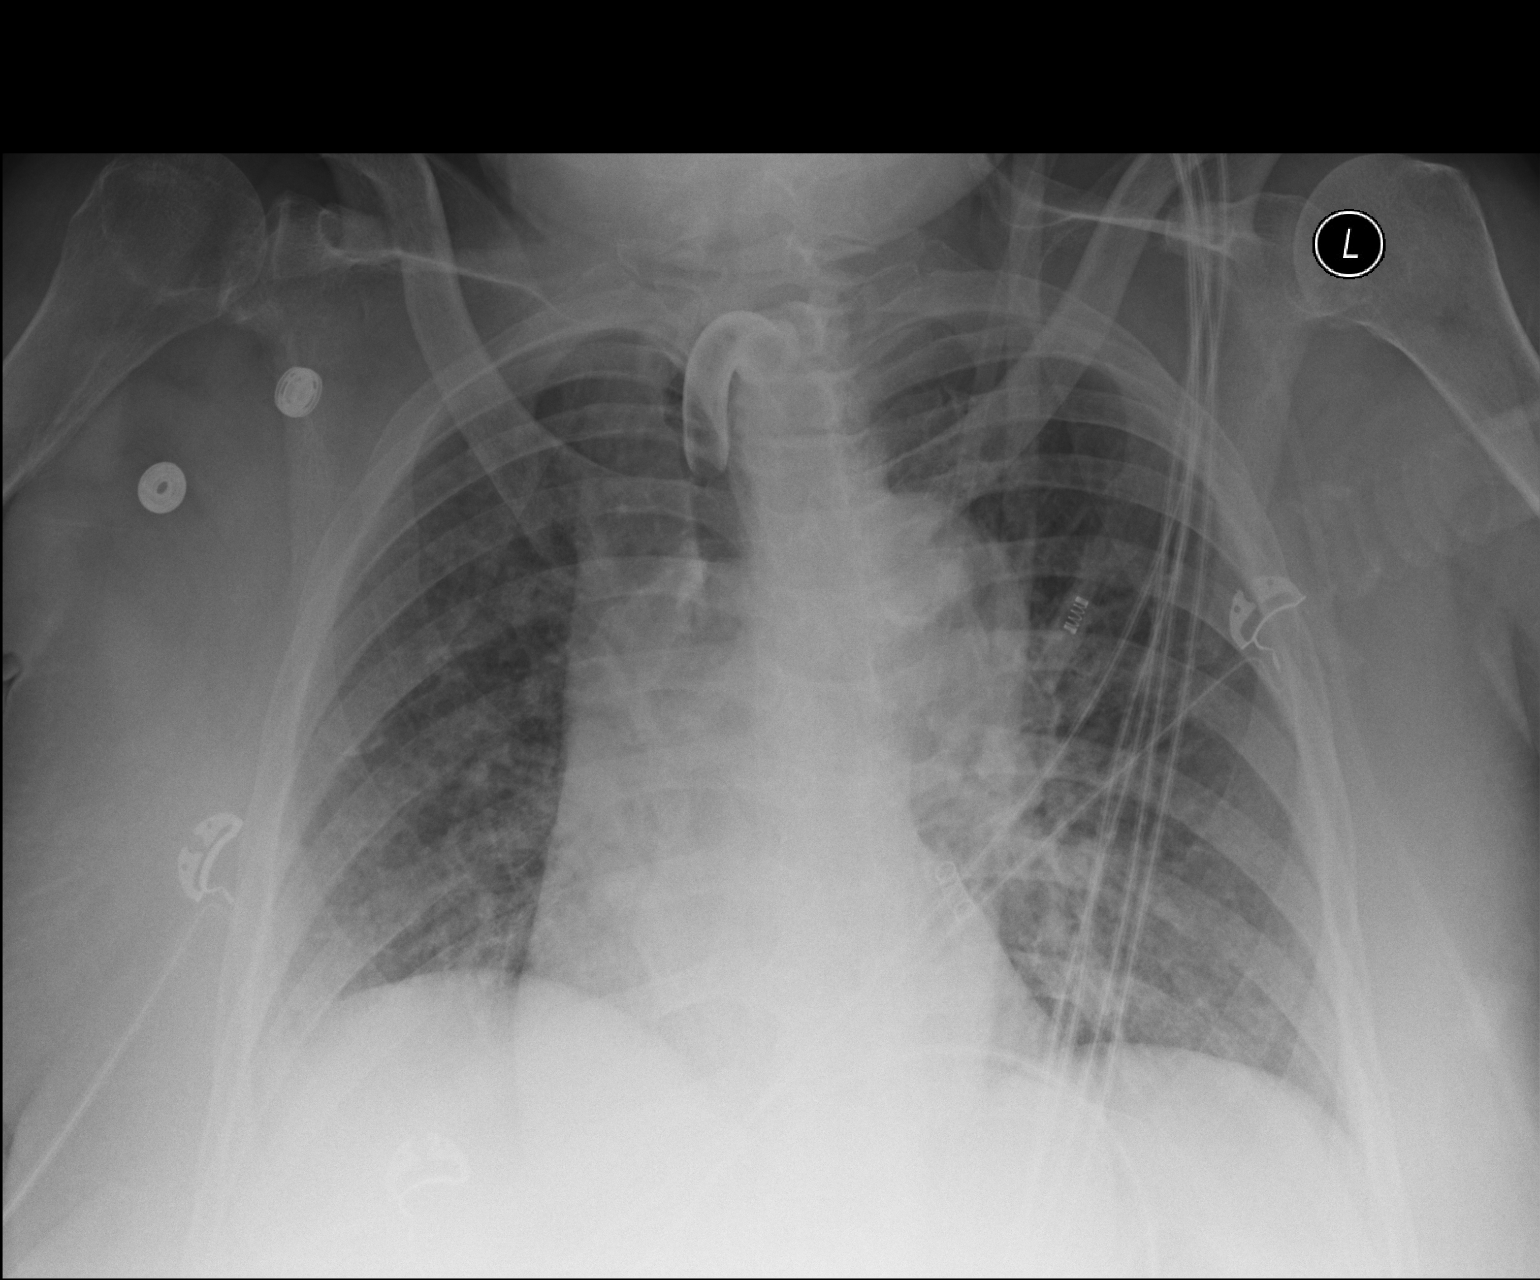

[1 of 1 positions shown; findings below may reference images not displayed]

FINDINGS: Tracheostomy cecum imaging. Stable cardiac silhouette. Mild central
venous pulmonary congestion no effusion, infiltrate, pneumothorax.
IMPRESSION: Mild central venous congestion.  No significant change
